# Patient Record
Sex: Female | Born: 1940 | Race: Black or African American | Hispanic: No | Smoking: Former smoker
Health system: Southern US, Community
[De-identification: ages and names within clinical notes are randomized; demographics above are authoritative.]

## PROBLEM LIST (undated history)

## (undated) DIAGNOSIS — H269 Unspecified cataract: Secondary | ICD-10-CM

## (undated) DIAGNOSIS — I10 Essential (primary) hypertension: Secondary | ICD-10-CM

## (undated) DIAGNOSIS — Z860100 Personal history of colon polyps, unspecified: Secondary | ICD-10-CM

## (undated) DIAGNOSIS — J189 Pneumonia, unspecified organism: Secondary | ICD-10-CM

## (undated) DIAGNOSIS — T7840XA Allergy, unspecified, initial encounter: Secondary | ICD-10-CM

## (undated) DIAGNOSIS — K219 Gastro-esophageal reflux disease without esophagitis: Secondary | ICD-10-CM

## (undated) DIAGNOSIS — M810 Age-related osteoporosis without current pathological fracture: Secondary | ICD-10-CM

## (undated) DIAGNOSIS — D649 Anemia, unspecified: Secondary | ICD-10-CM

## (undated) DIAGNOSIS — D490 Neoplasm of unspecified behavior of digestive system: Secondary | ICD-10-CM

## (undated) DIAGNOSIS — R32 Unspecified urinary incontinence: Secondary | ICD-10-CM

## (undated) DIAGNOSIS — E785 Hyperlipidemia, unspecified: Secondary | ICD-10-CM

## (undated) DIAGNOSIS — J449 Chronic obstructive pulmonary disease, unspecified: Secondary | ICD-10-CM

## (undated) DIAGNOSIS — G629 Polyneuropathy, unspecified: Secondary | ICD-10-CM

## (undated) DIAGNOSIS — M21961 Unspecified acquired deformity of right lower leg: Secondary | ICD-10-CM

## (undated) DIAGNOSIS — K2950 Unspecified chronic gastritis without bleeding: Secondary | ICD-10-CM

## (undated) DIAGNOSIS — G5603 Carpal tunnel syndrome, bilateral upper limbs: Secondary | ICD-10-CM

## (undated) DIAGNOSIS — J302 Other seasonal allergic rhinitis: Secondary | ICD-10-CM

## (undated) DIAGNOSIS — R7303 Prediabetes: Secondary | ICD-10-CM

## (undated) DIAGNOSIS — R203 Hyperesthesia: Secondary | ICD-10-CM

## (undated) DIAGNOSIS — M21962 Unspecified acquired deformity of left lower leg: Secondary | ICD-10-CM

## (undated) DIAGNOSIS — M1990 Unspecified osteoarthritis, unspecified site: Secondary | ICD-10-CM

## (undated) DIAGNOSIS — M069 Rheumatoid arthritis, unspecified: Secondary | ICD-10-CM

## (undated) HISTORY — DX: Allergy, unspecified, initial encounter: T78.40XA

## (undated) HISTORY — PX: COLONOSCOPY: SHX174

## (undated) HISTORY — DX: Anemia, unspecified: D64.9

## (undated) HISTORY — DX: Unspecified chronic gastritis without bleeding: K29.50

## (undated) HISTORY — DX: Age-related osteoporosis without current pathological fracture: M81.0

## (undated) HISTORY — PX: VAGINAL HYSTERECTOMY: SUR661

## (undated) HISTORY — DX: Pneumonia, unspecified organism: J18.9

## (undated) HISTORY — DX: Neoplasm of unspecified behavior of digestive system: D49.0

## (undated) HISTORY — DX: Polyneuropathy, unspecified: G62.9

## (undated) HISTORY — PX: CATARACT EXTRACTION: SUR2

## (undated) HISTORY — DX: Prediabetes: R73.03

## (undated) HISTORY — PX: DENTAL SURGERY: SHX609

## (undated) HISTORY — DX: Chronic obstructive pulmonary disease, unspecified: J44.9

---
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
## 2002-11-25 ENCOUNTER — Emergency Department (HOSPITAL_COMMUNITY): Admission: EM | Admit: 2002-11-25 | Discharge: 2002-11-25 | Payer: Self-pay | Admitting: Emergency Medicine

## 2011-10-11 ENCOUNTER — Other Ambulatory Visit: Payer: Self-pay | Admitting: Orthopedic Surgery

## 2011-10-16 ENCOUNTER — Encounter (HOSPITAL_COMMUNITY): Payer: Self-pay | Admitting: Pharmacy Technician

## 2011-10-23 ENCOUNTER — Encounter (HOSPITAL_COMMUNITY): Payer: Self-pay

## 2011-10-23 ENCOUNTER — Encounter (HOSPITAL_COMMUNITY)
Admission: RE | Admit: 2011-10-23 | Discharge: 2011-10-23 | Disposition: A | Discharge status: Home / Self Care | Payer: PRIVATE HEALTH INSURANCE | Source: Ambulatory Visit | Attending: Orthopedic Surgery | Admitting: Orthopedic Surgery

## 2011-10-23 HISTORY — DX: Unspecified cataract: H26.9

## 2011-10-23 HISTORY — DX: Personal history of colonic polyps: Z86.010

## 2011-10-23 HISTORY — DX: Essential (primary) hypertension: I10

## 2011-10-23 HISTORY — DX: Personal history of colon polyps, unspecified: Z86.0100

## 2011-10-23 HISTORY — DX: Hyperesthesia: R20.3

## 2011-10-23 HISTORY — DX: Other seasonal allergic rhinitis: J30.2

## 2011-10-23 HISTORY — DX: Hyperlipidemia, unspecified: E78.5

## 2011-10-23 LAB — URINALYSIS, ROUTINE W REFLEX MICROSCOPIC
Glucose, UA: NEGATIVE mg/dL
Hgb urine dipstick: NEGATIVE
Specific Gravity, Urine: 1.029 (ref 1.005–1.030)
pH: 5.5 (ref 5.0–8.0)

## 2011-10-23 LAB — BASIC METABOLIC PANEL
BUN: 16 mg/dL (ref 6–23)
CO2: 25 mEq/L (ref 19–32)
Chloride: 103 mEq/L (ref 96–112)
Creatinine, Ser: 0.85 mg/dL (ref 0.50–1.10)
Potassium: 3.6 mEq/L (ref 3.5–5.1)

## 2011-10-23 LAB — CBC
HCT: 41.8 % (ref 36.0–46.0)
Hemoglobin: 14.6 g/dL (ref 12.0–15.0)
MCV: 95 fL (ref 78.0–100.0)
RDW: 12.2 % (ref 11.5–15.5)
WBC: 9 10*3/uL (ref 4.0–10.5)

## 2011-10-23 LAB — URINE MICROSCOPIC-ADD ON

## 2011-10-23 LAB — ABO/RH: ABO/RH(D): A POS

## 2011-10-23 LAB — DIFFERENTIAL
Basophils Absolute: 0 10*3/uL (ref 0.0–0.1)
Eosinophils Relative: 3 % (ref 0–5)
Lymphocytes Relative: 36 % (ref 12–46)
Lymphs Abs: 3.3 10*3/uL (ref 0.7–4.0)
Monocytes Absolute: 0.7 10*3/uL (ref 0.1–1.0)
Monocytes Relative: 8 % (ref 3–12)

## 2011-10-23 LAB — TYPE AND SCREEN
ABO/RH(D): A POS
Antibody Screen: NEGATIVE

## 2011-10-23 LAB — APTT: aPTT: 33 seconds (ref 24–37)

## 2011-10-23 MED ORDER — CHLORHEXIDINE GLUCONATE 4 % EX LIQD: 60.0000 mL | Freq: Once | CUTANEOUS | Status: DC

## 2011-10-23 NOTE — Pre-Procedure Instructions (Signed)
[name]  10/23/2011   Your procedure is scheduled on:  Mon, Mar 18 @ 1255pm  Report to [facility] at 1055 AM.  Call this number if you have problems the morning of surgery: [phone]   Remember:   Do not eat food:After Midnight.  May have clear liquids: up to 4 Hours before arrival.(until 6:45 am)  Clear liquids include soda, tea, black coffee, apple or grape juice, broth,water  Take these medicines the morning of surgery with A SIP OF WATER: Amlodipine   Do not wear jewelry, make-up or nail polish.  Do not wear lotions, powders, or perfumes. You may wear deodorant.  Do not shave 48 hours prior to surgery.  Do not bring valuables to the hospital.  Contacts, dentures or bridgework may not be worn into surgery.  Leave suitcase in the car. After surgery it may be brought to your room.  For patients admitted to the hospital, checkout time is 11:00 AM the day of discharge.      Special Instructions: CHG Shower Use Special Wash: 1/2 bottle night before surgery and 1/2 bottle morning of surgery.   Please read over the following fact sheets that you were given: Pain Booklet, Coughing and Deep Breathing, Blood Transfusion Information, Total Joint Packet, MRSA Information and Surgical Site Infection Prevention

## 2011-10-23 NOTE — Progress Notes (Signed)
Pt doesn't have a cardiologist;medical MD Dr.Hodges in [place] maintains HTN/Hyperlipidemia [phone]  Stress test done > 67yrs ago  EKG done 10/19/11-to request from Dr.Hodges  Denies heart cath/echo

## 2011-10-25 LAB — URINE CULTURE

## 2011-10-29 MED ORDER — CEFAZOLIN SODIUM-DEXTROSE 2-3 GM-% IV SOLR
2.0000 g | INTRAVENOUS | Status: AC
  Administered 2011-10-30: 2 g via INTRAVENOUS
  Filled 2011-10-29: qty 50

## 2011-10-29 NOTE — H&P (Signed)
  HISTORY OF PRESENT ILLNESS:  [name] is a 71 year old patient who comes in today complaining of bilateral knee pain that began several years ago with no known mechanism of injury.  She reports that the left knee typically bothers her more than the right but she does have pain in both knees. She localizes her pain to the anterior and medial aspects of the knee and complains of associated popping and instability.  Her family doctor has tried several different arthritis medicines including Celebrex and Mobic. [name] reports minimal pain relief with those and uses BC powders because that seems to work better than anything else.  She has been retired for the last few years but reports that she has trouble doing her chores around the house and her knee pain often wakes her from sleep at night.  At her last visit, she was given a cortisone injection that lasted 1 week.  PAST MEDICAL HISTORY:  Significant for hypertension and high cholesterol.  PAST SURGICAL HISTORY:  Significant for hysterectomy.  ALLERGIES:  She has no know drug allergies.  CURRENT MEDICATIONS:  Hyzaar, Norvasc, simvastatin, K-Dur, baby aspirin and a vitamin.  FAMILY HISTORY:  Negative for diabetes or heart disease.  SOCIAL HISTORY:  She denies the use of alcohol or tobacco.  Review of systems reviewed thoroughly and negative aside from her musculoskeletal complaints.  ROS: Patient denies dizziness, nausea, fever, chills, vomiting, shortness of breath, chest pain, loss of appetite, or rash.    PHYSICAL EXAM: Awake, alert, and oriented x3.  Extraocular motion is intact.  No use of accessory respiratory muscles for breathing.   Cardiovascular exam reveals a regular rhythm.  Skin is intact without cuts, scrapes, or abrasions. Well-developed, well-nourished, 71 year old lady who is alert and oriented, in no acute distress.  Knees demonstrate varus deformity, left greater than right.  She is diffusely tender to palpation along  the medial joint line.  Range of motion is 10 to 95 degrees in the left knee.  She has no obvious effusion.  The ligaments are globally stable.  Skin is intact.  Neurovascularly is within normal limits.  RADIOGRAPHS: X-rays were ordered, performed, and interpreted by me today.  Four views of the knees demonstrate end-stage arthritis in the medial compartment of the left greater than right knee.  She also has some bony erosion on the left side.  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  The chance of Visco supplementation helping her is well under 50%.  It is her desire to proceed with total knee arthroplasty.  Models were brought into the room, the surgery and the length of stay were discussed.

## 2011-10-30 ENCOUNTER — Encounter (HOSPITAL_COMMUNITY): Payer: Self-pay | Admitting: Certified Registered Nurse Anesthetist

## 2011-10-30 ENCOUNTER — Inpatient Hospital Stay (HOSPITAL_COMMUNITY)
Admission: RE | Admit: 2011-10-30 | Discharge: 2011-11-02 | DRG: 470 | Disposition: A | Discharge status: Home Health | Payer: PRIVATE HEALTH INSURANCE | Source: Ambulatory Visit | Attending: Orthopedic Surgery | Admitting: Orthopedic Surgery

## 2011-10-30 ENCOUNTER — Encounter (HOSPITAL_COMMUNITY): Payer: Self-pay | Admitting: *Deleted

## 2011-10-30 ENCOUNTER — Encounter (HOSPITAL_COMMUNITY)
Admission: RE | Disposition: A | Discharge status: Home Health | Payer: Self-pay | Source: Ambulatory Visit | Attending: Orthopedic Surgery

## 2011-10-30 ENCOUNTER — Ambulatory Visit (HOSPITAL_COMMUNITY): Payer: PRIVATE HEALTH INSURANCE | Admitting: Certified Registered Nurse Anesthetist

## 2011-10-30 DIAGNOSIS — Z7901 Long term (current) use of anticoagulants: Secondary | ICD-10-CM

## 2011-10-30 DIAGNOSIS — M1712 Unilateral primary osteoarthritis, left knee: Secondary | ICD-10-CM

## 2011-10-30 DIAGNOSIS — I10 Essential (primary) hypertension: Secondary | ICD-10-CM | POA: Diagnosis present

## 2011-10-30 DIAGNOSIS — E785 Hyperlipidemia, unspecified: Secondary | ICD-10-CM | POA: Diagnosis present

## 2011-10-30 DIAGNOSIS — M1710 Unilateral primary osteoarthritis, unspecified knee: Principal | ICD-10-CM | POA: Diagnosis present

## 2011-10-30 DIAGNOSIS — Z79899 Other long term (current) drug therapy: Secondary | ICD-10-CM

## 2011-10-30 DIAGNOSIS — Z860100 Personal history of colon polyps, unspecified: Secondary | ICD-10-CM

## 2011-10-30 DIAGNOSIS — E7800 Pure hypercholesterolemia, unspecified: Secondary | ICD-10-CM | POA: Diagnosis present

## 2011-10-30 DIAGNOSIS — Z01812 Encounter for preprocedural laboratory examination: Secondary | ICD-10-CM

## 2011-10-30 HISTORY — PX: TOTAL KNEE ARTHROPLASTY: SHX125

## 2011-10-30 SURGERY — ARTHROPLASTY, KNEE, TOTAL
Anesthesia: General | Site: Knee | Laterality: Left | Wound class: Clean

## 2011-10-30 MED ORDER — LACTATED RINGERS IV SOLN
INTRAVENOUS | Status: DC | PRN
  Administered 2011-10-30 (×2): via INTRAVENOUS

## 2011-10-30 MED ORDER — METHOCARBAMOL 100 MG/ML IJ SOLN
500.0000 mg | INTRAVENOUS | Status: AC
  Filled 2011-10-30: qty 5

## 2011-10-30 MED ORDER — ONDANSETRON HCL 4 MG/2ML IJ SOLN
4.0000 mg | Freq: Four times a day (QID) | INTRAMUSCULAR | Status: DC | PRN
  Filled 2011-10-30: qty 2

## 2011-10-30 MED ORDER — COUMADIN BOOK
Freq: Once | Status: AC
  Administered 2011-10-30: 15:00:00
  Filled 2011-10-30: qty 1

## 2011-10-30 MED ORDER — MENTHOL 3 MG MT LOZG: 1.0000 | LOZENGE | OROMUCOSAL | Status: DC | PRN

## 2011-10-30 MED ORDER — BUPIVACAINE-EPINEPHRINE PF 0.5-1:200000 % IJ SOLN
INTRAMUSCULAR | Status: DC | PRN
  Administered 2011-10-30: 30 mL

## 2011-10-30 MED ORDER — WARFARIN SODIUM 5 MG PO TABS
5.0000 mg | ORAL_TABLET | Freq: Every day | ORAL | Status: AC
  Administered 2011-10-30: 5 mg via ORAL
  Filled 2011-10-30: qty 1

## 2011-10-30 MED ORDER — WARFARIN - PHARMACIST DOSING INPATIENT
Freq: Every day | Status: DC
  Administered 2011-10-30: 18:00:00

## 2011-10-30 MED ORDER — DEXTROSE-NACL 5-0.45 % IV SOLN: INTRAVENOUS | Status: DC

## 2011-10-30 MED ORDER — WARFARIN VIDEO: Freq: Once | Status: DC

## 2011-10-30 MED ORDER — KCL IN DEXTROSE-NACL 20-5-0.45 MEQ/L-%-% IV SOLN
INTRAVENOUS | Status: DC
  Administered 2011-10-30 – 2011-11-01 (×4): via INTRAVENOUS
  Filled 2011-10-30 (×12): qty 1000

## 2011-10-30 MED ORDER — FENTANYL CITRATE 0.05 MG/ML IJ SOLN
50.0000 ug | INTRAMUSCULAR | Status: DC | PRN
  Administered 2011-10-30: 100 ug via INTRAVENOUS

## 2011-10-30 MED ORDER — MIDAZOLAM HCL 2 MG/2ML IJ SOLN
INTRAMUSCULAR | Status: AC
  Filled 2011-10-30: qty 2

## 2011-10-30 MED ORDER — ACETAMINOPHEN 10 MG/ML IV SOLN
1000.0000 mg | Freq: Once | INTRAVENOUS | Status: AC
  Administered 2011-10-30: 1000 mg via INTRAVENOUS
  Filled 2011-10-30: qty 100

## 2011-10-30 MED ORDER — PROPOFOL 10 MG/ML IV BOLUS
INTRAVENOUS | Status: DC | PRN
  Administered 2011-10-30: 120 mg via INTRAVENOUS

## 2011-10-30 MED ORDER — METHOCARBAMOL 500 MG PO TABS
500.0000 mg | ORAL_TABLET | Freq: Four times a day (QID) | ORAL | Status: DC | PRN
  Administered 2011-10-31: 500 mg via ORAL
  Filled 2011-10-30: qty 1

## 2011-10-30 MED ORDER — FENTANYL CITRATE 0.05 MG/ML IJ SOLN
INTRAMUSCULAR | Status: DC | PRN
  Administered 2011-10-30: 50 ug via INTRAVENOUS
  Administered 2011-10-30: 100 ug via INTRAVENOUS
  Administered 2011-10-30 (×2): 50 ug via INTRAVENOUS

## 2011-10-30 MED ORDER — HYDROMORPHONE HCL PF 1 MG/ML IJ SOLN
0.2500 mg | INTRAMUSCULAR | Status: DC | PRN
  Administered 2011-10-30 (×2): 0.25 mg via INTRAVENOUS

## 2011-10-30 MED ORDER — LACTATED RINGERS IV SOLN
INTRAVENOUS | Status: DC
  Administered 2011-10-30 (×2): via INTRAVENOUS

## 2011-10-30 MED ORDER — OXYCODONE-ACETAMINOPHEN 5-325 MG PO TABS: 1.0000 | ORAL_TABLET | ORAL | Status: DC | PRN

## 2011-10-30 MED ORDER — ENOXAPARIN SODIUM 30 MG/0.3ML ~~LOC~~ SOLN
30.0000 mg | Freq: Two times a day (BID) | SUBCUTANEOUS | Status: DC
  Administered 2011-10-30 – 2011-11-01 (×4): 30 mg via SUBCUTANEOUS
  Filled 2011-10-30 (×7): qty 0.3

## 2011-10-30 MED ORDER — PHENOL 1.4 % MT LIQD: 1.0000 | OROMUCOSAL | Status: DC | PRN

## 2011-10-30 MED ORDER — BISACODYL 10 MG RE SUPP: 10.0000 mg | Freq: Every day | RECTAL | Status: DC | PRN

## 2011-10-30 MED ORDER — ONDANSETRON HCL 4 MG/2ML IJ SOLN
INTRAMUSCULAR | Status: DC | PRN
  Administered 2011-10-30: 4 mg via INTRAVENOUS

## 2011-10-30 MED ORDER — MIDAZOLAM HCL 2 MG/2ML IJ SOLN
1.0000 mg | INTRAMUSCULAR | Status: DC | PRN
  Administered 2011-10-30: 1 mg via INTRAVENOUS

## 2011-10-30 MED ORDER — SIMVASTATIN 10 MG PO TABS
10.0000 mg | ORAL_TABLET | Freq: Every day | ORAL | Status: DC
  Administered 2011-10-30 – 2011-11-01 (×3): 10 mg via ORAL
  Filled 2011-10-30 (×6): qty 1

## 2011-10-30 MED ORDER — LOSARTAN POTASSIUM 50 MG PO TABS
100.0000 mg | ORAL_TABLET | Freq: Every day | ORAL | Status: DC
  Administered 2011-10-31 – 2011-11-02 (×3): 100 mg via ORAL
  Filled 2011-10-30 (×4): qty 2

## 2011-10-30 MED ORDER — ACETAMINOPHEN 10 MG/ML IV SOLN
INTRAVENOUS | Status: AC
  Filled 2011-10-30: qty 100

## 2011-10-30 MED ORDER — METHOCARBAMOL 100 MG/ML IJ SOLN
500.0000 mg | Freq: Four times a day (QID) | INTRAVENOUS | Status: DC | PRN
  Filled 2011-10-30: qty 5

## 2011-10-30 MED ORDER — MIDAZOLAM HCL 2 MG/2ML IJ SOLN: 2.0000 mg | INTRAMUSCULAR | Status: DC | PRN

## 2011-10-30 MED ORDER — HYDROCHLOROTHIAZIDE 25 MG PO TABS
25.0000 mg | ORAL_TABLET | Freq: Every day | ORAL | Status: DC
  Administered 2011-11-01: 25 mg via ORAL
  Filled 2011-10-30 (×4): qty 1

## 2011-10-30 MED ORDER — ZOLPIDEM TARTRATE 5 MG PO TABS: 5.0000 mg | ORAL_TABLET | Freq: Every evening | ORAL | Status: DC | PRN

## 2011-10-30 MED ORDER — DOCUSATE SODIUM 100 MG PO CAPS
100.0000 mg | ORAL_CAPSULE | Freq: Two times a day (BID) | ORAL | Status: DC
  Administered 2011-10-30 – 2011-11-02 (×7): 100 mg via ORAL
  Filled 2011-10-30 (×8): qty 1

## 2011-10-30 MED ORDER — METOCLOPRAMIDE HCL 5 MG PO TABS
5.0000 mg | ORAL_TABLET | Freq: Three times a day (TID) | ORAL | Status: DC | PRN
  Filled 2011-10-30: qty 2

## 2011-10-30 MED ORDER — FLEET ENEMA 7-19 GM/118ML RE ENEM: 1.0000 | ENEMA | Freq: Once | RECTAL | Status: AC | PRN

## 2011-10-30 MED ORDER — HYDROCODONE-ACETAMINOPHEN 5-325 MG PO TABS
1.0000 | ORAL_TABLET | ORAL | Status: DC | PRN
  Administered 2011-10-30 – 2011-11-01 (×5): 2 via ORAL
  Filled 2011-10-30 (×6): qty 2

## 2011-10-30 MED ORDER — FENTANYL CITRATE 0.05 MG/ML IJ SOLN: 100.0000 ug | INTRAMUSCULAR | Status: DC | PRN

## 2011-10-30 MED ORDER — ACETAMINOPHEN 325 MG PO TABS
650.0000 mg | ORAL_TABLET | Freq: Four times a day (QID) | ORAL | Status: DC | PRN
  Administered 2011-10-31 – 2011-11-02 (×2): 650 mg via ORAL
  Filled 2011-10-30 (×2): qty 2

## 2011-10-30 MED ORDER — HYDROMORPHONE HCL PF 1 MG/ML IJ SOLN: 0.5000 mg | INTRAMUSCULAR | Status: DC | PRN

## 2011-10-30 MED ORDER — ACETAMINOPHEN 650 MG RE SUPP: 650.0000 mg | Freq: Four times a day (QID) | RECTAL | Status: DC | PRN

## 2011-10-30 MED ORDER — CEFUROXIME SODIUM 1.5 G IJ SOLR
INTRAMUSCULAR | Status: DC | PRN
  Administered 2011-10-30: 1.5 g

## 2011-10-30 MED ORDER — AMLODIPINE BESYLATE 5 MG PO TABS
5.0000 mg | ORAL_TABLET | Freq: Every day | ORAL | Status: DC
  Administered 2011-10-30 – 2011-11-01 (×3): 5 mg via ORAL
  Filled 2011-10-30 (×5): qty 1

## 2011-10-30 MED ORDER — FENTANYL CITRATE 0.05 MG/ML IJ SOLN
INTRAMUSCULAR | Status: AC
  Filled 2011-10-30: qty 2

## 2011-10-30 MED ORDER — ALUM & MAG HYDROXIDE-SIMETH 200-200-20 MG/5ML PO SUSP: 30.0000 mL | ORAL | Status: DC | PRN

## 2011-10-30 MED ORDER — DIPHENHYDRAMINE HCL 12.5 MG/5ML PO ELIX: 12.5000 mg | ORAL_SOLUTION | ORAL | Status: DC | PRN

## 2011-10-30 MED ORDER — ONDANSETRON HCL 4 MG PO TABS
4.0000 mg | ORAL_TABLET | Freq: Four times a day (QID) | ORAL | Status: DC | PRN
  Filled 2011-10-30: qty 1

## 2011-10-30 MED ORDER — LOSARTAN POTASSIUM-HCTZ 100-25 MG PO TABS: 1.0000 | ORAL_TABLET | Freq: Every day | ORAL | Status: DC

## 2011-10-30 MED ORDER — SODIUM CHLORIDE 0.9 % IR SOLN
Status: DC | PRN
  Administered 2011-10-30: 3000 mL

## 2011-10-30 MED ORDER — MAGNESIUM HYDROXIDE 400 MG/5ML PO SUSP: 30.0000 mL | Freq: Every day | ORAL | Status: DC | PRN

## 2011-10-30 MED ORDER — METOCLOPRAMIDE HCL 5 MG/ML IJ SOLN: 5.0000 mg | Freq: Three times a day (TID) | INTRAMUSCULAR | Status: DC | PRN

## 2011-10-30 SURGICAL SUPPLY — 60 items
BANDAGE ELASTIC 6 VELCRO ST LF (GAUZE/BANDAGES/DRESSINGS) ×2 IMPLANT
BANDAGE ESMARK 6X9 LF (GAUZE/BANDAGES/DRESSINGS) ×1 IMPLANT
BLADE SAG 18X100X1.27 (BLADE) ×2 IMPLANT
BLADE SAGITTAL 25.0X1.19X90 (BLADE) ×2 IMPLANT
BLADE SAW SAG 29X58X.64 (BLADE) IMPLANT
BLADE SAW SGTL 13.0X1.19X90.0M (BLADE) ×2 IMPLANT
BLADE SAW SGTL 13X75X1.27 (BLADE) ×2 IMPLANT
BLADE SURG ROTATE 9660 (MISCELLANEOUS) IMPLANT
BNDG ELASTIC 6X10 VLCR STRL LF (GAUZE/BANDAGES/DRESSINGS) ×2 IMPLANT
BNDG ESMARK 6X9 LF (GAUZE/BANDAGES/DRESSINGS) ×2
BOWL SMART MIX CTS (DISPOSABLE) ×2 IMPLANT
CEMENT HV SMART SET (Cement) ×4 IMPLANT
CLOTH BEACON ORANGE TIMEOUT ST (SAFETY) ×2 IMPLANT
COVER BACK TABLE 24X17X13 BIG (DRAPES) IMPLANT
COVER SURGICAL LIGHT HANDLE (MISCELLANEOUS) ×4 IMPLANT
CUFF TOURNIQUET SINGLE 34IN LL (TOURNIQUET CUFF) ×2 IMPLANT
CUFF TOURNIQUET SINGLE 44IN (TOURNIQUET CUFF) IMPLANT
DRAPE EXTREMITY T 121X128X90 (DRAPE) ×2 IMPLANT
DRAPE U-SHAPE 47X51 STRL (DRAPES) ×2 IMPLANT
DURAPREP 26ML APPLICATOR (WOUND CARE) ×2 IMPLANT
ELECT REM PT RETURN 9FT ADLT (ELECTROSURGICAL) ×2
ELECTRODE REM PT RTRN 9FT ADLT (ELECTROSURGICAL) ×1 IMPLANT
EVACUATOR 1/8 PVC DRAIN (DRAIN) ×2 IMPLANT
GAUZE XEROFORM 1X8 LF (GAUZE/BANDAGES/DRESSINGS) ×2 IMPLANT
GLOVE BIO SURGEON STRL SZ7 (GLOVE) ×2 IMPLANT
GLOVE BIO SURGEON STRL SZ7.5 (GLOVE) ×2 IMPLANT
GLOVE BIOGEL PI IND STRL 6.5 (GLOVE) ×3 IMPLANT
GLOVE BIOGEL PI IND STRL 7.0 (GLOVE) ×1 IMPLANT
GLOVE BIOGEL PI IND STRL 8 (GLOVE) ×1 IMPLANT
GLOVE BIOGEL PI INDICATOR 6.5 (GLOVE) ×3
GLOVE BIOGEL PI INDICATOR 7.0 (GLOVE) ×1
GLOVE BIOGEL PI INDICATOR 8 (GLOVE) ×1
GOWN PREVENTION PLUS XLARGE (GOWN DISPOSABLE) ×2 IMPLANT
GOWN STRL NON-REIN LRG LVL3 (GOWN DISPOSABLE) ×4 IMPLANT
HANDPIECE INTERPULSE COAX TIP (DISPOSABLE) ×1
HOOD PEEL AWAY FACE SHEILD DIS (HOOD) ×4 IMPLANT
KIT BASIN OR (CUSTOM PROCEDURE TRAY) ×2 IMPLANT
KIT ROOM TURNOVER OR (KITS) ×2 IMPLANT
MANIFOLD NEPTUNE II (INSTRUMENTS) ×2 IMPLANT
NS IRRIG 1000ML POUR BTL (IV SOLUTION) ×2 IMPLANT
PACK TOTAL JOINT (CUSTOM PROCEDURE TRAY) ×2 IMPLANT
PAD ARMBOARD 7.5X6 YLW CONV (MISCELLANEOUS) ×4 IMPLANT
PAD CAST 4YDX4 CTTN HI CHSV (CAST SUPPLIES) ×1 IMPLANT
PADDING CAST COTTON 4X4 STRL (CAST SUPPLIES) ×1
PADDING CAST COTTON 6X4 STRL (CAST SUPPLIES) ×2 IMPLANT
SET HNDPC FAN SPRY TIP SCT (DISPOSABLE) ×1 IMPLANT
SPONGE GAUZE 4X4 12PLY (GAUZE/BANDAGES/DRESSINGS) ×4 IMPLANT
STAPLER VISISTAT 35W (STAPLE) ×2 IMPLANT
SUCTION FRAZIER TIP 10 FR DISP (SUCTIONS) ×2 IMPLANT
SURGIFLO TRUKIT (HEMOSTASIS) IMPLANT
SUT VIC AB 0 CTX 36 (SUTURE) ×1
SUT VIC AB 0 CTX36XBRD ANTBCTR (SUTURE) ×1 IMPLANT
SUT VIC AB 1 CTX 36 (SUTURE) ×1
SUT VIC AB 1 CTX36XBRD ANBCTR (SUTURE) ×1 IMPLANT
SUT VIC AB 2-0 CT1 27 (SUTURE) ×1
SUT VIC AB 2-0 CT1 TAPERPNT 27 (SUTURE) ×1 IMPLANT
TOWEL OR 17X24 6PK STRL BLUE (TOWEL DISPOSABLE) ×2 IMPLANT
TOWEL OR 17X26 10 PK STRL BLUE (TOWEL DISPOSABLE) ×2 IMPLANT
TRAY FOLEY CATH 14FR (SET/KITS/TRAYS/PACK) ×2 IMPLANT
WATER STERILE IRR 1000ML POUR (IV SOLUTION) ×6 IMPLANT

## 2011-10-30 NOTE — Transfer of Care (Signed)
Immediate Anesthesia Transfer of Care Note  Patient: Ann Gamble  Procedure(s) Performed: Procedure(s) (LRB): TOTAL KNEE ARTHROPLASTY (Left)  Patient Location: PACU  Anesthesia Type: GA combined with regional for post-op pain  Level of Consciousness: awake, alert  and oriented  Airway & Oxygen Therapy: Patient Spontanous Breathing and Patient connected to nasal cannula oxygen  Post-op Assessment: Report given to PACU RN and Post -op Vital signs reviewed and stable  Post vital signs: Reviewed and stable  Complications: No apparent anesthesia complications

## 2011-10-30 NOTE — Progress Notes (Signed)
Orthopedic Tech Progress Note Patient Details:  Ann Gamble [birth year] [id number]  CPM Left Knee CPM Left Knee: On Left Knee Flexion (Degrees): 30  Left Knee Extension (Degrees): 0    Sheliah Fiorillo T 10/30/2011, 1:43 PM

## 2011-10-30 NOTE — Anesthesia Preprocedure Evaluation (Signed)
Anesthesia Evaluation  Patient identified by MRN, date of birth, ID band Patient awake    Reviewed: Allergy & Precautions, H&P , NPO status , Patient's Chart, lab work & pertinent test results  Airway Mallampati: II TM Distance: >3 FB Neck ROM: Full    Dental No notable dental hx. (+) Edentulous Upper and Edentulous Lower   Pulmonary neg pulmonary ROS,  breath sounds clear to auscultation  Pulmonary exam normal       Cardiovascular hypertension, On Medications Rhythm:Regular Rate:Normal     Neuro/Psych negative neurological ROS  negative psych ROS   GI/Hepatic negative GI ROS, Neg liver ROS,   Endo/Other  negative endocrine ROS  Renal/GU negative Renal ROS  negative genitourinary   Musculoskeletal   Abdominal   Peds  Hematology negative hematology ROS (+)   Anesthesia Other Findings   Reproductive/Obstetrics negative OB ROS                           Anesthesia Physical Anesthesia Plan  ASA: II  Anesthesia Plan: General   Post-op Pain Management:    Induction: Intravenous  Airway Management Planned: Oral ETT  Additional Equipment:   Intra-op Plan:   Post-operative Plan: Extubation in OR  Informed Consent: I have reviewed the patients History and Physical, chart, labs and discussed the procedure including the risks, benefits and alternatives for the proposed anesthesia with the patient or authorized representative who has indicated his/her understanding and acceptance.     Plan Discussed with: CRNA  Anesthesia Plan Comments:         Anesthesia Quick Evaluation

## 2011-10-30 NOTE — Anesthesia Procedure Notes (Addendum)
Anesthesia Regional Block:  Femoral nerve block  Pre-Anesthetic Checklist: ,, timeout performed, Correct Patient, Correct Site, Correct Laterality, Correct Procedure, Correct Position, site marked, Risks and benefits discussed, pre-op evaluation,  At surgeon's request and post-op pain management  Laterality: Left  Prep: Maximum Sterile Barrier Precautions used and chloraprep       Needles:  Injection technique: Single-shot  Needle Type: Echogenic Stimulator Needle      Needle Gauge: 22 and 22 G    Additional Needles:  Procedures: nerve stimulator Femoral nerve block  Nerve Stimulator or Paresthesia:  Response: Patellar respose, 0.4 mA,   Additional Responses:   Narrative:  Start time: 10/30/2011 10:30 AM End time: 10/30/2011 10:40 AM Injection made incrementally with aspirations every 5 mL. Anesthesiologist: Fitzgerald,MD  Additional Notes: 2% Lidocaine skin wheel.   Femoral nerve block Procedure Name: LMA Insertion Date/Time: 10/30/2011 11:11 AM Performed by: Delbert Harness Pre-anesthesia Checklist: Patient identified, Timeout performed, Emergency Drugs available, Suction available and Patient being monitored Patient Re-evaluated:Patient Re-evaluated prior to inductionOxygen Delivery Method: Circle system utilized Preoxygenation: Pre-oxygenation with 100% oxygen Intubation Type: IV induction LMA: LMA inserted LMA Size: 4.0 Number of attempts: 1 Tube secured with: Tape Dental Injury: Teeth and Oropharynx as per pre-operative assessment

## 2011-10-30 NOTE — Interval H&P Note (Signed)
History and Physical Interval Note:  10/30/2011 10:57 AM  Ann Gamble  has presented today for surgery, with the diagnosis of Osetoarthritis Left Knee  The various methods of treatment have been discussed with the patient and family. After consideration of risks, benefits and other options for treatment, the patient has consented to  Procedure(s) (LRB): TOTAL KNEE ARTHROPLASTY (Left) as a surgical intervention .  The patients' history has been reviewed, patient examined, no change in status, stable for surgery.  I have reviewed the patients' chart and labs.  Questions were answered to the patient's satisfaction.     Nestor Lewandowsky

## 2011-10-30 NOTE — Anesthesia Postprocedure Evaluation (Signed)
Anesthesia Post Note  Patient: Ann Gamble  Procedure(s) Performed: Procedure(s) (LRB): TOTAL KNEE ARTHROPLASTY (Left)  Anesthesia type: general  Patient location: PACU  Post pain: Pain level controlled  Post assessment: Patient's Cardiovascular Status Stable  Last Vitals:  Filed Vitals:   10/30/11 1420  BP:   Pulse:   Temp: 36.6 C  Resp:     Post vital signs: Reviewed and stable  Level of consciousness: sedated  Complications: No apparent anesthesia complications

## 2011-10-30 NOTE — Op Note (Signed)
PATIENT ID:      Ann Gamble  MRN:     [id number] DOB/AGE:    1941-05-13 / 71 y.o.       OPERATIVE REPORT    DATE OF PROCEDURE:  10/30/2011       PREOPERATIVE DIAGNOSIS:   Osetoarthritis Left Knee      There is no height or weight on file to calculate BMI.                                                        POSTOPERATIVE DIAGNOSIS:   Osetoarthritis Left Knee                                                                      PROCEDURE:  Procedure(s): TOTAL KNEE ARTHROPLASTY Using Depuy Sigma RP implants #4N L Femur, #3Tibia, 10mm sigma RP bearing, 35 Patella     SURGEON: Harlowe Dowler J    ASSISTANT:   Shirl Harris PA-C   (Present and scrubbed throughout the case, critical for assistance with exposure, retraction, instrumentation, and closure.)         ANESTHESIA: Fem block/GET   DRAINS: foley, 2 medium hemovac in knee   TOURNIQUET TIME:    COMPLICATIONS:  None     SPECIMENS: None   INDICATIONS FOR PROCEDURE: The patient has  Osetoarthritis Left Knee, varus deformities, XR shows bone on bone arthritis. Patient has failed all conservative measures including anti-inflammatory medicines, narcotics, attempts at  exercise and weight loss, cortisone injections and viscosupplementation.  Risks and benefits of surgery have been discussed, questions answered.   DESCRIPTION OF PROCEDURE: The patient identified by armband, received  right femoral nerve block and IV antibiotics, in the holding area at [facility]. Patient taken to the operating room, appropriate anesthetic  monitors were attached General endotracheal anesthesia induced with  the patient in supine position, Foley catheter was inserted. Tourniquet  applied high to the operative thigh. Lateral post and foot positioner  applied to the table, the lower extremity was then prepped and draped  in usual sterile fashion from the ankle to the tourniquet. Time-out procedure was performed. The limb was wrapped  with an Esmarch bandage and the tourniquet inflated to 350 mmHg. We began the operation by making the anterior midline incision starting at  handbreadth above the patella going over the patella 1 cm medial to and  4 cm distal to the tibial tubercle. Small bleeders in the skin and the  subcutaneous tissue identified and cauterized. Transverse retinaculum was incised and reflected medially and a medial parapatellar arthrotomy was accomplished. the patella was everted and theprepatellar fat pad resected. The superficial medial collateral  ligament was then elevated from anterior to posterior along the proximal  flare of the tibia and anterior half of the menisci resected. The knee was hyperflexed exposing bone on bone arthritis. Peripheral and notch osteophytes as well as the cruciate ligaments were then resected. We continued to  work our way around posteriorly along the proximal tibia, and externally  rotated the tibia subluxing it out from underneath the  femur. A McHale  retractor was placed through the notch and a lateral Hohmann retractor  placed, and we then drilled through the proximal tibia in line with the  axis of the tibia followed by an intramedullary guide rod and 2-degree  posterior slope cutting guide. The tibial cutting guide was pinned into place  allowing resection of 3 mm of bone medially and about 10 mm of bone  laterally because of her varus deformity. Satisfied with the tibial resection, we then  entered the distal femur 2 mm anterior to the PCL origin with the  intramedullary guide rod and applied the distal femoral cutting guide  set at 11mm, with 5 degrees of valgus. This was pinned along the  epicondylar axis. At this point, the distal femoral cut was accomplished without difficulty. We then sized for a #4N L femoral component and pinned the guide in 3 degrees of external rotation.The chamfer cutting guide was pinned into place. The anterior, posterior, and chamfer cuts were  accomplished without difficulty followed by  the Sigma RP box cutting guide and the box cut. We also removed posterior osteophytes from the posterior femoral condyles. At this  time, the knee was brought into full extension. We checked our  extension and flexion gaps and found them symmetric at 10mm.  The patella thickness measured at 24mm mm. We felt a 35 patella would  fit. We set the cutting guide at 15 and removed the posterior 9.5-10 mm  of the patella sized for 35 button and drilled the lollipop. The knee  was then once again hyperflexed exposing the proximal tibia. We sized for a #3 tibial base plate, applied the smokestack and the conical reamer followed by the the Delta fin keel punch. We then hammered into place the Sigma RP trial femoral component, inserted a 10-mm trial bearing, trial patellar button, and took the knee through range of motion from 0-130 degrees. No thumb pressure was required for patellar  tracking. At this point, all trial components were removed, a double batch of DePuy HV cement with 1500 mg of Zinacef was mixed and applied to all bony metallic mating surfaces except for the posterior condyles of the femur itself. In order, we  hammered into place the tibial tray and removed excess cement, the femoral component and removed excess cement, a 10-mm Sigma RP bearing  was inserted, and the knee brought to full extension with compression.  The patellar button was clamped into place, and excess cement  removed. While the cement cured the wound was irrigated out with normal saline solution pulse lavage, and medium Hemovac drains were placed from an anterolateral  approach. Ligament stability and patellar tracking were checked and found to be excellent. The parapatellar arthrotomy was closed with  running #1 Vicryl suture. The subcutaneous tissue with 0 and 2-0 undyed  Vicryl suture, and the skin with skin staples. A dressing of Xeroform,  4 x 4, dressing sponges, Webril, and  Ace wrap applied. The patient  awakened, extubated, and taken to recovery room without difficulty.   Nestor Lewandowsky 10/30/2011, 12:32 PM

## 2011-10-30 NOTE — Preoperative (Signed)
Beta Blockers   Reason not to administer Beta Blockers:Not Applicable 

## 2011-10-30 NOTE — Progress Notes (Signed)
ANTICOAGULATION CONSULT NOTE - Initial Consult  Pharmacy Consult for Coumadin Indication: VTE prophylaxis  No Known Allergies  Patient Measurements: 76 kg  Vital Signs: Temp: 97.2 F (36.2 C) (03/18 1453) Temp src: Oral (03/18 0900) BP: 112/67 mmHg (03/18 1453) Pulse Rate: 91  (03/18 1453)  Labs: No results found for this basename: HGB:2,HCT:3,PLT:3,APTT:3,LABPROT:3,INR:3,HEPARINUNFRC:3,CREATININE:3,CKTOTAL:3,CKMB:3,TROPONINI:3 in the last 72 hours CrCl is unknown because there is no height on file for the current visit.  Medical History: Past Medical History  Diagnosis Date  . Hypertension     takes Hyzaar and Amlodipine daily  . Hyperlipidemia     takes Simvastatin daily  . Seasonal allergies   . Arthritis   . Sensitive skin     pt states she has highly sensitive skin  . Constipation     takes stool softener nightly  . Hx of colonic polyps   . Urinary leakage   . Cataract immature     right    Medications:  Scheduled:    . acetaminophen  1,000 mg Intravenous Once  . amLODipine  5 mg Oral QHS  .  ceFAZolin (ANCEF) IV  2 g Intravenous 60 min Pre-Op  . docusate sodium  100 mg Oral BID  . enoxaparin  30 mg Subcutaneous Q12H  . hydrochlorothiazide  25 mg Oral Daily  . losartan  100 mg Oral Daily  . methocarbamol(ROBAXIN) IV  500 mg Intravenous To PACU  . simvastatin  10 mg Oral QHS  . DISCONTD: losartan-hydrochlorothiazide  1 tablet Oral Daily    Assessment: 71 year old s/p TKA, Beginning Coumadin for VTE prophylaxis  Goal of Therapy:  INR 2-3   Plan:  1) Coumadin 5 mg po daily at 1800 pm 2) Daily PT / INR 3) Begin Coumadin education  Thank you.  Elwin Sleight 10/30/2011,3:11 PM

## 2011-10-30 NOTE — Progress Notes (Signed)
Notified Dr. Sampson Goon of pt. has a ring on left fifth finger that will not come off. No new orders.

## 2011-10-31 ENCOUNTER — Encounter (HOSPITAL_COMMUNITY): Payer: Self-pay | Admitting: Orthopedic Surgery

## 2011-10-31 LAB — CBC
Platelets: 209 10*3/uL (ref 150–400)
RDW: 12.3 % (ref 11.5–15.5)
WBC: 9.9 10*3/uL (ref 4.0–10.5)

## 2011-10-31 LAB — BASIC METABOLIC PANEL
Chloride: 99 mEq/L (ref 96–112)
GFR calc Af Amer: >90 mL/min (ref >90)
Potassium: 3 mEq/L — ABNORMAL LOW (ref 3.5–5.1)

## 2011-10-31 LAB — PROTIME-INR: Prothrombin Time: 15.7 seconds — ABNORMAL HIGH (ref 11.6–15.2)

## 2011-10-31 MED ORDER — WARFARIN SODIUM 3 MG PO TABS
3.0000 mg | ORAL_TABLET | Freq: Once | ORAL | Status: AC
  Administered 2011-10-31: 3 mg via ORAL
  Filled 2011-10-31: qty 1

## 2011-10-31 NOTE — Progress Notes (Signed)
Physical Therapy Note   10/31/11 1100  PT Visit Information  Last PT Received On 10/31/11  Precautions  Precautions Knee  Restrictions  Weight Bearing Restrictions Yes  LLE Weight Bearing WBAT  Bed Mobility  Bed Mobility No  Transfers  Transfers Yes  Sit to Stand 5: Supervision;With upper extremity assist;From chair/3-in-1  Sit to Stand Details (indicate cue type and reason) demos good technique  Stand to Sit 5: Supervision;With upper extremity assist;To chair/3-in-1  Stand to Sit Details demos good technique.  Only cues to center pt at chair prior to sitting.    Ambulation/Gait  Ambulation/Gait Yes  Ambulation/Gait Assistance 4: Min assist  Ambulation/Gait Assistance Details (indicate cue type and reason) cues for upright posture, decrease WB on RW  Ambulation Distance (Feet) 100 Feet  Assistive device Rolling walker  Gait Pattern Step-to pattern;Decreased step length - right;Decreased stance time - left;Trunk flexed  Stairs No  Wheelchair Mobility  Wheelchair Mobility No  Posture/Postural Control  Posture/Postural Control No significant limitations  Balance  Balance Assessed No  Total Joint Exercises  Long Arc Quad AROM;Left;10 reps  Knee Flexion AROM;Left;10 reps  PT - End of Session  Equipment Utilized During Treatment Gait belt  Activity Tolerance Patient tolerated treatment well  Patient left in chair;with call bell in reach  Nurse Communication Mobility status for transfers;Mobility status for ambulation  General  Behavior During Session [facility] for tasks performed  [facility] for tasks performed  PT - Assessment/Plan  Comments on Treatment Session pt s/p TKA.  pt making great progress!  pt notes doing APs and QSs independently.    PT Plan Discharge plan remains appropriate;Frequency remains appropriate  PT Frequency 7X/week  Follow Up Recommendations Home health PT;Supervision - Intermittent  Equipment Recommended None recommended by PT  Acute Rehab PT Goals    PT Goal: Sit to Stand - Progress Progressing toward goal  PT Goal: Stand to Sit - Progress Progressing toward goal  PT Goal: Ambulate - Progress Progressing toward goal  PT Goal: Perform Home Exercise Program - Progress Progressing toward goal

## 2011-10-31 NOTE — Progress Notes (Signed)
ANTICOAGULATION CONSULT NOTE - Follow Up Consult  Pharmacy Consult for Coumadin Indication: VTE prophylaxis  No Known Allergies  Patient Measurements: 76 kg  Vital Signs: Temp: 99 F (37.2 C) (03/19 0900) Temp src: Oral (03/19 0900) BP: 128/60 mmHg (03/19 0900) Pulse Rate: 88  (03/19 0900)  Labs:  Basename 10/31/11 0525  HGB 10.3*  HCT 30.1*  PLT 209  APTT --  LABPROT 15.7*  INR 1.22  HEPARINUNFRC --  CREATININE 0.76  CKTOTAL --  CKMB --  TROPONINI --   Estimated Creatinine Clearance: 60.5 ml/min (by C-G formula based on Cr of 0.76).  Medical History: Past Medical History  Diagnosis Date  . Hypertension     takes Hyzaar and Amlodipine daily  . Hyperlipidemia     takes Simvastatin daily  . Seasonal allergies   . Arthritis   . Sensitive skin     pt states she has highly sensitive skin  . Constipation     takes stool softener nightly  . Hx of colonic polyps   . Urinary leakage   . Cataract immature     right    Medications:  Scheduled:     . acetaminophen  1,000 mg Intravenous Once  . amLODipine  5 mg Oral QHS  .  ceFAZolin (ANCEF) IV  2 g Intravenous 60 min Pre-Op  . coumadin book   Does not apply Once  . docusate sodium  100 mg Oral BID  . enoxaparin  30 mg Subcutaneous Q12H  . hydrochlorothiazide  25 mg Oral Daily  . losartan  100 mg Oral Daily  . methocarbamol(ROBAXIN) IV  500 mg Intravenous To PACU  . simvastatin  10 mg Oral QHS  . warfarin  5 mg Oral q1800  . warfarin   Does not apply Once  . Warfarin - Pharmacist Dosing Inpatient   Does not apply q1800  . DISCONTD: losartan-hydrochlorothiazide  1 tablet Oral Daily    Assessment: 71 y.o. female s/p TKA on Coumadin for VTE prophylaxis. POD#1.  INR 1.22  No bleeding reported.   Goal of Therapy:  INR =1.5-2 per Dr. Wadie Lessen note   Plan:  1) Coumadin 3 mg po daily at 1800 pm 2) Daily PT / INR  Thank you.  Arman Filter 10/31/2011,11:10 AM

## 2011-10-31 NOTE — Clinical Documentation Improvement (Signed)
Abnormal Labs Clarification  THIS DOCUMENT IS NOT A PERMANENT PART OF THE MEDICAL RECORD  TO RESPOND TO THE THIS QUERY, FOLLOW THE INSTRUCTIONS BELOW:  1. If needed, update documentation for the patient's encounter via the notes activity.  2. Access this query again and click edit on the [employer].  3. After updating, or not, click F2 to complete all highlighted (required) fields concerning your review. Select "additional documentation in the medical record" OR "no additional documentation provided".  4. Click Sign note button.  5. The deficiency will fall out of your InBasket *Please let us know if you are not able to complete this workflow by phone or e-mail (listed below).  Please update your documentation within the medical record to reflect your response to this query.                                                                                   10/31/11  Dear Dr.  Turner Daniels Marton Redwood  In a better effort to capture your patient's severity of illness, reflect appropriate length of stay and utilization of resources, a review of the medical record has revealed the following indicators.    Abnormal findings (laboratory, x-ray, pathologic, and other diagnostic results) are not coded and reported unless the physician indicates their clinical significance.   The medical record reflects the following clinical findings, please clarify  the diagnostic and/or clinical significance:  PLEASE ADDRESS THE FOLLOWING ABNORMAL LABS IN NOTES/DC SUMMARY.  THANK YOU.      - Patient Values: H/H for 3/11:14.6/41.8, for 3/19:10.3/30.1  - Treatment: Monitor CBC x3 days   Reviewed:  no additional documentation provided  Thank You,  Beverley Fiedler RN Clinical Documentation Specialist: CELL: [phone] Health Information Management [place]

## 2011-10-31 NOTE — Progress Notes (Signed)
UR COMPLETED  

## 2011-10-31 NOTE — Evaluation (Addendum)
Occupational Therapy Evaluation Patient Details Name: Ann Gamble MRN: [id number] DOB: 1940-09-11 Today's Date: 10/31/2011  Problem List:  Patient Active Problem List  Diagnoses  . Arthritis of left knee    Past Medical History:  Past Medical History  Diagnosis Date  . Hypertension     takes Hyzaar and Amlodipine daily  . Hyperlipidemia     takes Simvastatin daily  . Seasonal allergies   . Arthritis   . Sensitive skin     pt states she has highly sensitive skin  . Constipation     takes stool softener nightly  . Hx of colonic polyps   . Urinary leakage   . Cataract immature     right   Past Surgical History:  Past Surgical History  Procedure Date  . Tonsillectomy     at age 60  . Abdominal hysterectomy >75yrs ago    partial  . Colonoscopy   . 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 presents s/p Lt. TKA and with increased pain and beloq problem list. Pt. will benefit from skilled OT to increase functional level of independence to supervision level at D/C. OT Recommendation/Assessment: Patient will need skilled OT in the acute care venue OT Problem List: Decreased activity tolerance;Decreased safety awareness;Decreased knowledge of use of DME or AE;Pain Barriers to Discharge: None OT Therapy Diagnosis : Generalized weakness;Acute pain OT Plan OT Frequency: Min 2X/week OT Treatment/Interventions: Self-care/ADL training;DME and/or AE instruction;Therapeutic activities;Patient/family education;Balance training OT Recommendation Follow Up Recommendations: Supervision - Intermittent;No OT follow up Equipment Recommended: Tub/shower bench Individuals Consulted Consulted and Agree with Results and Recommendations: Patient OT Goals Acute Rehab OT  Goals OT Goal Formulation: With patient Time For Goal Achievement: 7 days ADL Goals Pt Will Perform Lower Body Bathing: with set-up;with supervision;Sit to stand from chair;with adaptive equipment ADL Goal: Lower Body Bathing - Progress: Goal set today Pt Will Perform Lower Body Dressing: with set-up;with supervision;Sit to stand from chair;with adaptive equipment ADL Goal: Lower Body Dressing - Progress: Goal set today Pt Will Transfer to Toilet: with supervision;with DME;Ambulation;3-in-1 ADL Goal: Toilet Transfer - Progress: Goal set today Pt Will Perform Tub/Shower Transfer: Tub transfer;with supervision;with DME;Ambulation;Transfer tub bench ADL Goal: Tub/Shower Transfer - Progress: Goal set today  OT Evaluation Precautions/Restrictions  Precautions Precautions: Knee Precaution Booklet Issued: Yes (comment) Restrictions Weight Bearing Restrictions: Yes LLE Weight Bearing: Weight bearing as tolerated Prior Functioning Home Living Lives With: Family Receives Help From: Family Type of Home: Mobile home Home Layout: One level Home Access: Ramped entrance Bathroom Shower/Tub: Tub/shower unit;Curtain Bathroom Toilet: Handicapped height Bathroom Accessibility: Yes How Accessible: Accessible via walker Home Adaptive Equipment: Walker - rolling;Straight cane;Grab bars in shower Prior Function Level of Independence: Independent with basic ADLs;Independent with homemaking with ambulation;Independent with gait;Independent with transfers Able to Take Stairs?: Yes Driving: Yes Vocation: Retired ADL ADL Eating/Feeding: Performed;Independent Where Assessed - Eating/Feeding: Chair Grooming: Performed;Wash/dry hands;Set up;Supervision/safety Grooming Details (indicate cue type and reason): Min verbal cues for safety with RW Where Assessed - Grooming: Standing at sink Upper Body Bathing: Simulated;Chest;Right arm;Left arm;Abdomen;Set up Where Assessed - Upper Body Bathing: Sitting,  chair Lower Body Bathing: Simulated;Moderate assistance Where Assessed - Lower Body Bathing: Sit to stand from chair Upper Body Dressing: Performed;Minimal assistance Upper Body Dressing Details (indicate cue type and reason): With donning gown  Where Assessed - Upper Body Dressing: Sitting, chair Lower Body Dressing: Simulated;Moderate assistance Where Assessed - Lower Body Dressing: Sit to stand from chair Toilet Transfer: Performed;Minimal assistance Toilet Transfer Method: Ambulating Toilet Transfer Equipment: Bedside commode Toileting - Clothing Manipulation: Performed;Minimal assistance Toileting - Clothing Manipulation Details (indicate cue type and reason): With moving gown Where Assessed - Toileting Clothing Manipulation: Sit on 3-in-1 or toilet Toileting - Hygiene: Performed;Set up Where Assessed - Toileting Hygiene: Sit on 3-in-1 or toilet Tub/Shower Transfer: Not assessed Equipment Used: Rolling walker Ambulation Related to ADLs: Pt. min guard assist with ~10' with RW ADL Comments: Pt. educated on techniques for completing LB ADLs and demonstrated for pt. shower transfer and tub/transfer bench.    Extremity Assessment LUE Assessment LUE Assessment: Within Functional Limits Mobility  Bed Mobility Bed Mobility: No Transfers Sit to Stand: 4: Min assist Sit to Stand Details (indicate cue type and reason): Min cues for safety Stand to Sit: 5: Supervision;With upper extremity assist;To chair/3-in-1 Stand to Sit Details: demos good technique.  Only cues to center pt at chair prior to sitting.     End of Session OT - End of Session Equipment Utilized During Treatment: Gait belt Activity Tolerance: Patient tolerated treatment well Patient left: in chair;with call bell in reach Nurse Communication: Mobility status for transfers General Behavior During Session: [facility] for tasks performed Cognition: [facility] for tasks performed   Verline Kong, OTR/L Pager [phone] 10/31/2011,  2:38 PM

## 2011-10-31 NOTE — Progress Notes (Signed)
Physical Therapy Evaluation  Past Medical History  Diagnosis Date  . Hypertension     takes Hyzaar and Amlodipine daily  . Hyperlipidemia     takes Simvastatin daily  . Seasonal allergies   . Arthritis   . Sensitive skin     pt states she has highly sensitive skin  . Constipation     takes stool softener nightly  . Hx of colonic polyps   . Urinary leakage   . Cataract immature     right   Past Surgical History  Procedure Date  . Tonsillectomy     at age 71  . Abdominal hysterectomy >51yrs ago    partial  . Colonoscopy   . 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    Procedure: TOTAL KNEE ARTHROPLASTY;  Surgeon: Nestor Lewandowsky, MD;  Location: [facility] OR;  Service: Orthopedics;  Laterality: Left;  DEPUY/SIGMA    10/31/11 0800  PT Visit Information  Last PT Received On 10/31/11  Patient Stated Goals  Goal #1 Back active in [facility]  Precautions  Precautions Knee  Precaution Booklet Issued Yes (comment)  Restrictions  Weight Bearing Restrictions Yes  LLE Weight Bearing WBAT  Home Living  Lives With Family  Receives Help From Family  Type of Home Mobile home  Home Layout One level  Home Access Ramped entrance  Bathroom Shower/Tub Tub/shower unit;Curtain  Bathroom Toilet Handicapped height  Bathroom Accessibility Yes  How Accessible Accessible via walker  Home Adaptive Equipment Walker - rolling;Straight cane;Grab bars in shower  Prior Function  Level of Independence Independent with basic ADLs;Independent with homemaking with ambulation;Independent with gait;Independent with transfers  Able to Take Stairs? Yes  Driving Yes  Vocation Retired  Engineer, building services Level Oriented X4  Bed Mobility  Bed Mobility Yes  Supine to Sit 4: Min assist  Supine to Sit Details (indicate cue type and reason) A with L LE only.    Sitting - Scoot to [place] of Bed 4: Min assist  Sitting - Scoot to [place] of Bed Details (indicate cue type and reason) A with L LE only  Transfers  Transfers Yes    Sit to Stand 4: Min assist;With upper extremity assist;From bed  Sit to Stand Details (indicate cue type and reason) cues for use of UEs, positioning of LEs  Stand to Sit 4: Min assist;With upper extremity assist;To chair/3-in-1  Stand to Sit Details cues to control descent, use of UEs, positioning of LEs  Ambulation/Gait  Ambulation/Gait Yes  Ambulation/Gait Assistance 4: Min assist  Ambulation/Gait Assistance Details (indicate cue type and reason) cues for sequencing, use of RW, upright posture, increase WBing on L LE.    Ambulation Distance (Feet) 50 Feet  Assistive device Rolling walker  Gait Pattern Step-to pattern;Decreased step length - right;Decreased stance time - left;Trunk flexed  Stairs No  Wheelchair Mobility  Wheelchair Mobility No  Posture/Postural Control  Posture/Postural Control No significant limitations  Balance  Balance Assessed No  RLE Assessment  RLE Assessment WFL  LLE Assessment  LLE Assessment X  LLE AROM (degrees)  LLE Overall AROM Comments AAROM ~10-70degrees  Exercises  Exercises Total Joint  Total Joint Exercises  Ankle Circles/Pumps AROM;Both;10 reps  Quad Sets AROM;Both;10 reps  Long [address] [place];Left;10 reps  Knee Flexion AAROM;Left;10 reps  PT - End of Session  Equipment Utilized During Treatment Gait belt  Activity Tolerance Patient tolerated treatment well  Patient left in chair;with call bell in reach  Nurse Communication Mobility status for transfers;Mobility status for ambulation  CPM Left Knee  CPM Left Knee Off  General  Behavior During Session [facility] for tasks performed  Cognition [facility] for tasks performed  PT Assessment  Clinical Impression Statement pt presents s/p TKA.  pt very motivated and moving well.  pt plans on D/C to home with family A.    PT Recommendation/Assessment Patient will need skilled PT in the acute care venue  PT Problem List Decreased strength;Decreased range of motion;Decreased activity tolerance;Decreased  balance;Decreased mobility;Decreased knowledge of use of DME;Pain  Barriers to Discharge None  PT Therapy Diagnosis  Abnormality of gait;Acute pain  PT Plan  PT Frequency 7X/week  PT Treatment/Interventions DME instruction;Gait training;Stair training;Functional mobility training;Therapeutic activities;Therapeutic exercise;Balance training;Patient/family education  PT Recommendation  Recommendations for Other Services OT consult  Follow Up Recommendations Home health PT;Supervision - Intermittent  Equipment Recommended None recommended by PT  Individuals Consulted  Consulted and Agree with Results and Recommendations Patient  Acute Rehab PT Goals  PT Goal Formulation With patient  Time For Goal Achievement 7 days  Pt will go Supine/Side to Sit with modified independence  PT Goal: Supine/Side to Sit - Progress Goal set today  Pt will go Sit to Supine/Side with modified independence  PT Goal: Sit to Supine/Side - Progress Goal set today  Pt will go Sit to Stand with modified independence  PT Goal: Sit to Stand - Progress Goal set today  Pt will go Stand to Sit with modified independence  PT Goal: Stand to Sit - Progress Goal set today  Pt will Ambulate >150 feet;with modified independence;with rolling walker  PT Goal: Ambulate - Progress Goal set today  Pt will Go Up / Down Stairs 3-5 stairs;with min assist;with least restrictive assistive device  PT Goal: Up/Down Stairs - Progress Goal set today  Pt will Perform Home Exercise Program Independently  PT Goal: Perform Home Exercise Program - Progress Goal set today    Mack Hook, PT [phone]

## 2011-10-31 NOTE — Progress Notes (Addendum)
Patient ID: Ann Gamble, female   DOB: [birth year], 71 y.o.   MRN: [id number] PATIENT ID: Ann Gamble  MRN: [id number]  DOB/AGE:  Nov 05, 1940 / 71 y.o.  1 Day Post-Op Procedure(s) (LRB): TOTAL KNEE ARTHROPLASTY (Left)    PROGRESS NOTE Subjective: Patient is alert, oriented, no Nausea, no Vomiting, yes} passing gas, no Bowel Movement. Taking PO well. Denies SOB, Chest or Calf Pain. Using Incentive Spirometer, PAS in place. Ambulate wbat, CPM 0-30 Patient reports pain as 3 on 0-10 scale  .    Objective: Vital signs in last 24 hours: Filed Vitals:   10/30/11 2148 10/30/11 2317 10/31/11 0213 10/31/11 0605  BP: 107/73 107/73 131/71 130/78  Pulse: 97  106 103  Temp: 97.5 F (36.4 C)  100.6 F (38.1 C) 101.1 F (38.4 C)  TempSrc:      Resp: 19  19 20   SpO2: 100%  92% 97%      Intake/Output from previous day: I/O last 3 completed shifts: In: 3755 [P.O.:480; I.V.:2950; Other:325] Out: 775 [Urine:250; Drains:475; Blood:50]   Intake/Output this shift:     LABORATORY DATA:  Basename 10/31/11 0525  WBC 9.9  HGB 10.3*  HCT 30.1*  PLT 209  NA 137  K 3.0*  CL 99  CO2 28  BUN 12  CREATININE 0.76  GLUCOSE 111*  GLUCAP --  INR 1.22  CALCIUM 9.0    Examination: Neurologically intact ABD soft Neurovascular intact Sensation intact distally Intact pulses distally Dorsiflexion/Plantar flexion intact Incision: dressing C/D/I No cellulitis present Compartment soft} Blood and plasma separated in drain indicating minimal recent drainage, drain pulled without difficulty.  Assessment:   1 Day Post-Op Procedure(s) (LRB): TOTAL KNEE ARTHROPLASTY (Left) ADDITIONAL DIAGNOSIS:    Plan: PT/OT WBAT, CPM 5/hrs day until ROM 0-90 degrees, then D/C CPM DVT Prophylaxis:  Coumadin bridge target INR 1.5-2.0 DISCHARGE PLAN: Home DISCHARGE NEEDS: HHPT, HHRN, CPM, Walker and 3-in-1 comode seat     Ann Gamble J 10/31/2011, 7:23 AM

## 2011-11-01 LAB — CBC
Platelets: 206 10*3/uL (ref 150–400)
RBC: 3.02 MIL/uL — ABNORMAL LOW (ref 3.87–5.11)
WBC: 12.2 10*3/uL — ABNORMAL HIGH (ref 4.0–10.5)

## 2011-11-01 LAB — PROTIME-INR: Prothrombin Time: 17.5 seconds — ABNORMAL HIGH (ref 11.6–15.2)

## 2011-11-01 MED ORDER — WARFARIN SODIUM 3 MG PO TABS
3.0000 mg | ORAL_TABLET | Freq: Every day | ORAL | Status: AC
  Administered 2011-11-01: 3 mg via ORAL
  Filled 2011-11-01: qty 1

## 2011-11-01 NOTE — Progress Notes (Signed)
Clinical Social Work-CSW received referral-reviewed chart and per MD/PA/CM/RN pt to d/c home with home health. No CSW needs at this time-Oneika Simonian-MSW, [phone]

## 2011-11-01 NOTE — Progress Notes (Signed)
CARE MANAGEMENT NOTE 11/01/2011      Action/Plan:   Spoke with patient, Choice offered. states she was setup with [facility], no changes. Has DME, requesting a tub bench. Entered in [facility]LC.   Anticipated DC Date:  11/02/2011   Anticipated DC Plan:  HOME W HOME HEALTH SERVICES      DC Planning Services  CM consult      PAC Choice  DURABLE MEDICAL EQUIPMENT  HOME HEALTH   Choice offered to / List presented to:  C-1 Patient   DME arranged  TUB BENCH      DME agency  [facility]     HH arranged  HH-2 PT      [facility] agency  [facility]   Status of service:  Completed, signed off  Discharge Disposition:  HOME W HOME HEALTH SERVICES

## 2011-11-01 NOTE — Progress Notes (Signed)
Orthopedic Tech Progress Note Patient Details:  Ann Gamble 12-Apr-1941 [id number]  Patient ID: [name], female   DOB: 03-Mar-1941, 71 y.o.   MRN: [id number]  Appllied overhead frame to bed. [name] T 11/01/2011, 1:15 PM

## 2011-11-01 NOTE — Progress Notes (Signed)
ANTICOAGULATION CONSULT NOTE - Follow Up Consult  Pharmacy Consult for Coumadin Indication: VTE prophylaxis  No Known Allergies  Patient Measurements: 76 kg  Vital Signs: Temp: 98.9 F (37.2 C) (03/20 0510) Temp src: Oral (03/19 2103) BP: 126/77 mmHg (03/20 0510) Pulse Rate: 76  (03/20 0510)  Labs:  Basename 11/01/11 0535 10/31/11 0525  HGB 9.6* 10.3*  HCT 28.8* 30.1*  PLT 206 209  APTT -- --  LABPROT 17.5* 15.7*  INR 1.41 1.22  HEPARINUNFRC -- --  CREATININE -- 0.76  CKTOTAL -- --  CKMB -- --  TROPONINI -- --   Estimated Creatinine Clearance: 60.5 ml/min (by C-G formula based on Cr of 0.76).  Medical History: Past Medical History  Diagnosis Date  . Hypertension     takes Hyzaar and Amlodipine daily  . Hyperlipidemia     takes Simvastatin daily  . Seasonal allergies   . Arthritis   . Sensitive skin     pt states she has highly sensitive skin  . Constipation     takes stool softener nightly  . Hx of colonic polyps   . Urinary leakage   . Cataract immature     right    Medications:  Scheduled:     . amLODipine  5 mg Oral QHS  . docusate sodium  100 mg Oral BID  . enoxaparin  30 mg Subcutaneous Q12H  . hydrochlorothiazide  25 mg Oral Daily  . losartan  100 mg Oral Daily  . methocarbamol(ROBAXIN) IV  500 mg Intravenous To PACU  . simvastatin  10 mg Oral QHS  . warfarin  3 mg Oral ONCE-1800  . warfarin   Does not apply Once  . Warfarin - Pharmacist Dosing Inpatient   Does not apply q1800    Assessment: 71 y.o. female s/p TKA on Coumadin for VTE prophylaxis. POD#2.  INR 1.41  No bleeding reported.   Goal of Therapy:  INR =1.5-2 per Dr. Wadie Lessen note   Plan:  1) Coumadin 3 mg po daily at 1800 pm 2) Daily PT / INR  Thank you.  Rayne Loiseau Poteet 11/01/2011,8:15 AM

## 2011-11-01 NOTE — Progress Notes (Signed)
Occupational Therapy Treatment Patient Details Name: Ann Gamble MRN: [id number] DOB: Apr 10, 1941 Today's Date: 11/01/2011  OT Assessment/Plan OT Assessment/Plan Comments on Treatment Session: Pt. progressing very well with therapy and with minimal pain. OT Plan: Discharge plan remains appropriate OT Frequency: Min 2X/week Follow Up Recommendations: Supervision - Intermittent;No OT follow up Equipment Recommended: Tub/shower bench OT Goals Acute Rehab OT Goals OT Goal Formulation: With patient Time For Goal Achievement: 7 days ADL Goals Pt Will Perform Tub/Shower Transfer: Tub transfer;with supervision;with DME;Ambulation;Transfer tub bench ADL Goal: Tub/Shower Transfer - Progress: Progressing toward goals  OT Treatment Precautions/Restrictions  Precautions Precautions: Knee Precaution Booklet Issued: Yes (comment) Restrictions Weight Bearing Restrictions: Yes LLE Weight Bearing: Weight bearing as tolerated   ADL ADL Tub/Shower Transfer: Simulated;Minimal assistance Tub/Shower Transfer Details (indicate cue type and reason): Mod verbal cues for transfer technique and assist provided for left LE over wall of tub to ensure pt. safety  Tub/Shower Transfer Method: Science writer: Counsellor Used: Rolling walker Ambulation Related to ADLs: Pt. supervision with ~100' RW ADL Comments: Pt. educated on techniques for completing LB ADLs and demonstrated for pt. shower transfer and tub/transfer bench. Mobility  Bed Mobility Bed Mobility: No Transfers Sit to Stand: 5: Supervision;With upper extremity assist;From chair/3-in-1 Sit to Stand Details (indicate cue type and reason): Min verbal cues for hand placement on RW     End of Session OT - End of Session Equipment Utilized During Treatment: Gait belt Activity Tolerance: Patient tolerated treatment well Patient left: in chair;with call bell in reach Nurse Communication: Mobility  status for transfers General Behavior During Session: [facility] for tasks performed Cognition: [facility] for tasks performed  Waller Marcussen, OTR/L Pager [phone]  11/01/2011, 12:18 PM

## 2011-11-01 NOTE — Progress Notes (Signed)
PATIENT ID: Ann Gamble  MRN: [id number]  DOB/AGE:  1941-04-04 / 70 y.o.  2 Days Post-Op Procedure(s) (LRB): TOTAL KNEE ARTHROPLASTY (Left)    PROGRESS NOTE Subjective: Patient is alert, oriented, no Nausea, no Vomiting, yes} passing gas, no Bowel Movement. Taking PO well. Denies SOB, Chest or Calf Pain. Using Incentive Spirometer, PAS in place. Ambulating well with PT. Patient reports pain as moderate  .    Objective: Vital signs in last 24 hours: Filed Vitals:   10/31/11 1420 10/31/11 1542 10/31/11 2103 11/01/11 0510  BP: 105/50 131/57 104/75 126/77  Pulse:  75 110 76  Temp:   100.9 F (38.3 C) 98.9 F (37.2 C)  TempSrc:   Oral   Resp:  20 18 18   Height:      Weight:      SpO2:  93% 94% 97%      Intake/Output from previous day: I/O last 3 completed shifts: In: 4984.2 [P.O.:480; I.V.:4179.2; Other:325] Out: 475 [Urine:200; Drains:275]   Intake/Output this shift: Total I/O In: 120 [P.O.:120] Out: -    LABORATORY DATA:  Basename 11/01/11 0535 10/31/11 0525  WBC 12.2* 9.9  HGB 9.6* 10.3*  HCT 28.8* 30.1*  PLT 206 209  NA -- 137  K -- 3.0*  CL -- 99  CO2 -- 28  BUN -- 12  CREATININE -- 0.76  GLUCOSE -- 111*  GLUCAP -- --  INR 1.41 1.22  CALCIUM -- 9.0    Examination: Neurologically intact ABD soft Neurovascular intact Sensation intact distally Intact pulses distally Dorsiflexion/Plantar flexion intact Incision: dressing C/D/I}  Assessment:   2 Days Post-Op Procedure(s) (LRB): TOTAL KNEE ARTHROPLASTY (Left) ADDITIONAL DIAGNOSIS:  none  Plan: PT/OT WBAT, CPM 5/hrs day until ROM 0-90 degrees, then D/C CPM DVT Prophylaxis:  Coumadin target INR 1.5-2.0 (No Lovenox) DISCHARGE PLAN: Home Thursday DISCHARGE NEEDS: HHPT, HHRN, CPM, Walker and 3-in-1 comode seat     Rifky Lapre M. 11/01/2011, 8:31 AM

## 2011-11-01 NOTE — Progress Notes (Signed)
Physical Therapy Treatment Note   11/01/11 1506  PT Visit Information  Last PT Received On 11/01/11  Precautions  Precautions Knee  Restrictions  LLE Weight Bearing WBAT  Bed Mobility  Supine to Sit 4: Min assist  Supine to Sit Details (indicate cue type and reason) assist for R LE, use of hand rail, HOB at 30 degrees  Sitting - Scoot to Edge of Bed 5: Supervision  Sitting - Scoot to Edge of Bed Details (indicate cue type and reason) increased time required  Sit to Supine 4: Min assist  Sit to Supine - Details (indicate cue type and reason) assist for L LE, pt able to scoot self up in bed with trapeze  Transfers  Sit to Stand 5: Supervision  Sit to Stand Details (indicate cue type and reason) v/cs for hand placement  Stand to Sit 5: Supervision  Ambulation/Gait  Ambulation/Gait Assistance 4: Min assist  Ambulation/Gait Assistance Details (indicate cue type and reason) contact guard,  Ambulation Distance (Feet) 150 Feet  Assistive device Rolling walker  Gait Pattern Step-to pattern;Decreased step length - left;Decreased stance time - left;Antalgic  Stairs No  Total Joint Exercises  Long Arc Quad AROM;5 reps;Left;Seated  Knee Flexion AROM;Left;10 reps (AAROM x 10 to achieve 90 degress of L knee flexion)  PT - End of Session  Equipment Utilized During Treatment Gait belt  Patient left in bed;with call bell in reach  General  Behavior During Session [facility] for tasks performed  Cognition [facility] for tasks performed  PT - Assessment/Plan  Comments on Treatment Session Patient with improved L LE knee ROM and strength in addition to ambulation tolerance this date. Patient demonstrates to be safe to return home with family support and assist 24/7.  PT Plan Discharge plan remains appropriate  PT Frequency 7X/week  Follow Up Recommendations Home health PT  Equipment Recommended Tub/shower bench  Acute Rehab PT Goals  PT Goal: Supine/Side to Sit - Progress Progressing toward goal  PT Goal:  Sit to Supine/Side - Progress Progressing toward goal  PT Goal: Sit to Stand - Progress Progressing toward goal  PT Goal: Stand to Sit - Progress Progressing toward goal  PT Goal: Ambulate - Progress Progressing toward goal  PT Goal: Perform Home Exercise Program - Progress Progressing toward goal    Pain: 7/10 post PT at L knee  Lewis Shock, PT, DPT Pager #: [phone] Office #: [phone]

## 2011-11-01 NOTE — Discharge Instructions (Signed)
Home Health to be provided by [facility] [phone] 

## 2011-11-02 LAB — CBC
MCH: 32 pg (ref 26.0–34.0)
Platelets: 202 10*3/uL (ref 150–400)
RBC: 2.78 MIL/uL — ABNORMAL LOW (ref 3.87–5.11)
WBC: 10.5 10*3/uL (ref 4.0–10.5)

## 2011-11-02 LAB — PROTIME-INR: Prothrombin Time: 16.7 seconds — ABNORMAL HIGH (ref 11.6–15.2)

## 2011-11-02 MED ORDER — METHOCARBAMOL 500 MG PO TABS: 500.0000 mg | ORAL_TABLET | Freq: Four times a day (QID) | ORAL | Status: AC | PRN

## 2011-11-02 MED ORDER — OXYCODONE-ACETAMINOPHEN 5-325 MG PO TABS: 1.0000 | ORAL_TABLET | ORAL | Status: AC | PRN

## 2011-11-02 MED ORDER — WARFARIN SODIUM 5 MG PO TABS: 5.0000 mg | ORAL_TABLET | Freq: Every day | ORAL | Status: AC

## 2011-11-02 NOTE — Progress Notes (Signed)
Physical Therapy Treatment Note   11/02/11 0900  PT Visit Information  Last PT Received On 11/01/11  Precautions  Precautions Knee  Required Braces or Orthoses No  Restrictions  LLE Weight Bearing WBAT  Bed Mobility  Supine to Sit 5: Supervision;HOB flat  Supine to Sit Details (indicate cue type and reason) practiced w/o rails to mimic home, increased time required but pt able to complete  Sitting - Scoot to Edge of Bed 5: Supervision  Transfers  Sit to Stand 5: Supervision  Sit to Stand Details (indicate cue type and reason) v/cs for hand placement  Stand to Sit 5: Supervision  Ambulation/Gait  Ambulation/Gait Assistance 5: Supervision  Ambulation/Gait Assistance Details (indicate cue type and reason) v/cs to sequence reciprocal gait pattern with RW and shorter step length, pt did well and had minimal pain  Ambulation Distance (Feet) 150 Feet  Assistive device Rolling walker  Gait Pattern Step-through pattern;Decreased stride length  Gait velocity improved from yesterday, [facility] for surgery  Total Joint Exercises  Quad Sets AROM;Left;10 reps;Supine  Long Arc Quad AROM;10 reps;Seated;Left  Knee Flexion AROM;Left;20 reps;Seated (with towel beneath foot)  Heel Slides Left;AROM;10 reps;Supine (achieved approx 30 deg active R knee flex)  PT - End of Session  Patient left in chair;with call bell in reach  General  Behavior During Session [facility] for tasks performed  Cognition [facility] for tasks performed  PT - Assessment/Plan  Comments on Treatment Session patient denies pain in L knee except for occasional "shot" of pain if she took to big of step. Patient demonstrates safe transfes and ambulation and is safe to d/c home when approved by MD. Patient with good home set up and support.  PT Plan Discharge plan remains appropriate  PT Frequency 7X/week  Follow Up Recommendations Home health PT;Supervision - Intermittent  Equipment Recommended Tub/shower bench  Acute Rehab PT Goals  PT Goal:  Supine/Side to Sit - Progress Progressing toward goal  PT Goal: Sit to Supine/Side - Progress Progressing toward goal  PT Goal: Sit to Stand - Progress Progressing toward goal  PT Goal: Stand to Sit - Progress Progressing toward goal  PT Goal: Ambulate - Progress Progressing toward goal  PT Goal: Perform Home Exercise Program - Progress Progressing toward goal    Lewis Shock, PT, DPT Pager #: [phone] Office #: [phone]

## 2011-11-02 NOTE — Progress Notes (Signed)
PATIENT ID: Ann Gamble  MRN: [id number]  DOB/AGE:  1941/06/11 / 71 y.o.  3 Days Post-Op Procedure(s) (LRB): TOTAL KNEE ARTHROPLASTY (Left)    PROGRESS NOTE Subjective: Patient is alert, oriented, no Nausea, no Vomiting, yes} passing gas, no Bowel Movement. Taking PO well. Denies SOB, Chest or Calf Pain. Using Incentive Spirometer, PAS in place. Ambulating well with PT. Patient reports pain as moderate  .    Objective: Vital signs in last 24 hours: Filed Vitals:   11/01/11 1626 11/01/11 2126 11/02/11 0610 11/02/11 0659  BP: 128/51 147/64 106/77   Pulse: 107 82 120   Temp: 98.9 F (37.2 C) 98.5 F (36.9 C) 100.6 F (38.1 C) 99.2 F (37.3 C)  TempSrc:  Oral    Resp: 16 18 18    Height:      Weight:      SpO2: 88% 92% 100%       Intake/Output from previous day: I/O last 3 completed shifts: In: 2454.2 [P.O.:600; I.V.:1854.2] Out: -    Intake/Output this shift:     LABORATORY DATA:  Basename 11/02/11 0545 11/01/11 0535 10/31/11 0525  WBC 10.5 12.2* --  HGB 8.9* 9.6* --  HCT 26.7* 28.8* --  PLT 202 206 --  NA -- -- 137  K -- -- 3.0*  CL -- -- 99  CO2 -- -- 28  BUN -- -- 12  CREATININE -- -- 0.76  GLUCOSE -- -- 111*  GLUCAP -- -- --  INR 1.33 1.41 --  CALCIUM -- -- 9.0    Examination: Neurologically intact ABD soft Neurovascular intact Sensation intact distally Intact pulses distally Dorsiflexion/Plantar flexion intact Incision: dressing C/D/I}  Assessment:   3 Days Post-Op Procedure(s) (LRB): TOTAL KNEE ARTHROPLASTY (Left) ADDITIONAL DIAGNOSIS:  Acute Blood Loss Anemia  Plan: PT/OT WBAT, CPM 5/hrs day until ROM 0-90 degrees, then D/C CPM DVT Prophylaxis:  Lovenox\Coumadin bridge target INR 1.5-2.0 DISCHARGE PLAN: Home today DISCHARGE NEEDS: HHPT, HHRN, CPM, Walker and 3-in-1 comode seat     Exie Chrismer M. 11/02/2011, 7:58 AM

## 2011-11-02 NOTE — Discharge Summary (Signed)
Patient ID: Ann Gamble MRN: [id number] DOB/AGE: Apr 12, 1941 71 y.o.  Admit date: 10/30/2011 Discharge date: 11/02/2011  Admission Diagnoses:  Principal Problem:  *Arthritis of left knee   Discharge Diagnoses:  Same  Past Medical History  Diagnosis Date  . Hypertension     takes Hyzaar and Amlodipine daily  . Hyperlipidemia     takes Simvastatin daily  . Seasonal allergies   . Arthritis   . Sensitive skin     pt states she has highly sensitive skin  . Constipation     takes stool softener nightly  . Hx of colonic polyps   . Urinary leakage   . Cataract immature     right    Surgeries: Procedure(s): TOTAL KNEE ARTHROPLASTY on 10/30/2011   Consultants:    Discharged Condition: Improved  Hospital Course: Ann Gamble is an 71 y.o. female who was admitted 10/30/2011 for operative treatment ofArthritis of left knee. Patient has severe unremitting pain that affects sleep, daily activities, and work/hobbies. After pre-op clearance the patient was taken to the operating room on 10/30/2011 and underwent  Procedure(s): TOTAL KNEE ARTHROPLASTY.    Patient was given perioperative antibiotics: Anti-infectives     Start     Dose/Rate Route Frequency Ordered Stop   10/30/11 1203   cefUROXime (ZINACEF) injection  Status:  Discontinued          As needed 10/30/11 1204 10/30/11 1249   10/30/11 0800   ceFAZolin (ANCEF) IVPB 2 g/50 mL premix        2 g 100 mL/hr over 30 Minutes Intravenous 60 min pre-op 10/29/11 1409 10/30/11 1123           Patient was given sequential compression devices, early ambulation, and chemoprophylaxis to prevent DVT.  Patient benefited maximally from hospital stay and there were no complications.    Recent vital signs: Patient Vitals for the past 24 hrs:  BP Temp Temp src Pulse Resp SpO2  11/02/11 0659 - 99.2 F (37.3 C) - - - -  11/02/11 0610 106/77 mmHg 100.6 F (38.1 C) - 120  18  100 %  11-19-11 2126 147/64 mmHg 98.5 F (36.9 C)  Oral 82  18  92 %  November 19, 2011 1626 128/51 mmHg 98.9 F (37.2 C) - 107  16  88 %  November 19, 2011 1242 96/56 mmHg 98.5 F (36.9 C) Oral 108  18  90 %  11-19-2011 0845 130/70 mmHg 99.3 F (37.4 C) Oral 118  18  93 %     Recent laboratory studies:  Basename 11/02/11 0545 November 19, 2011 0535 10/31/11 0525  WBC 10.5 12.2* --  HGB 8.9* 9.6* --  HCT 26.7* 28.8* --  PLT 202 206 --  NA -- -- 137  K -- -- 3.0*  CL -- -- 99  CO2 -- -- 28  BUN -- -- 12  CREATININE -- -- 0.76  GLUCOSE -- -- 111*  INR 1.33 1.41 --  CALCIUM -- -- 9.0     Discharge Medications:   Medication List  As of 11/02/2011  8:01 AM   STOP taking these medications         aspirin EC 81 MG tablet         TAKE these medications         amLODipine 5 MG tablet   Commonly known as: NORVASC   Take 5 mg by mouth at bedtime.      losartan-hydrochlorothiazide 100-25 MG per tablet   Commonly known as: HYZAAR  Take 1 tablet by mouth daily.      methocarbamol 500 MG tablet   Commonly known as: ROBAXIN   Take 1 tablet (500 mg total) by mouth every 6 (six) hours as needed.      oxyCODONE-acetaminophen 5-325 MG per tablet   Commonly known as: PERCOCET   Take 1-2 tablets by mouth every 4 (four) hours as needed.      simvastatin 10 MG tablet   Commonly known as: ZOCOR   Take 10 mg by mouth at bedtime.      STOOL SOFTENER PO   Take 1 capsule by mouth daily.      warfarin 5 MG tablet   Commonly known as: COUMADIN   Take 1 tablet (5 mg total) by mouth daily.            Diagnostic Studies: Dg Chest 2 View  10/23/2011  *RADIOLOGY REPORT*  Clinical Data: Preop for total knee arthroplasty  CHEST - 2 VIEW  Comparison: None  Findings: The heart size and mediastinal contours are within normal limits.  Both lungs are clear.  There is mild multilevel degenerative disc disease noted within the spine.  IMPRESSION: Negative exam.  Original Report Authenticated By: Rosealee Albee, M.D.    Disposition: 06-Home-Health Care  Svc  Discharge Orders    Future Orders Please Complete By Expires   Increase activity slowly      Walker       May shower / Bathe      Driving Restrictions      Comments:   No driving for 2 weeks.   Change dressing (specify)      Comments:   Dressing change as needed.   Call MD for:  temperature >100.4      Call MD for:  severe uncontrolled pain      Call MD for:  redness, tenderness, or signs of infection (pain, swelling, redness, odor or green/yellow discharge around incision site)      Discharge instructions      Comments:   F/U with Dr. Turner Daniels in 10 days         Signed: Hazle Nordmann. 11/02/2011, 8:01 AM

## 2011-11-02 NOTE — Progress Notes (Signed)
ANTICOAGULATION CONSULT NOTE - Follow Up Consult  Pharmacy Consult for Coumadin Indication: VTE prophylaxis s/p L TKA  No Known Allergies  Patient Measurements: 76 kg  Vital Signs: Temp: 99.2 F (37.3 C) (03/21 0659) Temp src: Oral (03/20 2126) BP: 106/77 mmHg (03/21 0610) Pulse Rate: 120  (03/21 0610)  Labs:  Basename 11/02/11 0545 11/01/11 0535 10/31/11 0525  HGB 8.9* 9.6* --  HCT 26.7* 28.8* 30.1*  PLT 202 206 209  APTT -- -- --  LABPROT 16.7* 17.5* 15.7*  INR 1.33 1.41 1.22  HEPARINUNFRC -- -- --  CREATININE -- -- 0.76  CKTOTAL -- -- --  CKMB -- -- --  TROPONINI -- -- --   Estimated Creatinine Clearance: 60.5 ml/min (by C-G formula based on Cr of 0.76).  Assessment: 71 y.o. female s/p L TKA on Coumadin for VTE prophylaxis.  INR 1.33- no real movement on 3mg  daily.  No bleeding reported. Noted pt to be d/c home today.  Goal of Therapy:  INR =1.5-2 per Dr. Wadie Lessen note   Plan:  1) Noted plan home on coumadin 5mg  daily with INR monitoring per [facility].  Christoper Fabian, PharmD, BCPS Clinical pharmacist, pager [phone] 11/02/2011,8:48 AM

## 2011-11-02 NOTE — Progress Notes (Signed)
Pt is s/p L TKR. Knee precautions. +cms. WBAT with RW. Ace dressing to L knee is dry and intact. Ice prn to L knee. +cms. Pt repts that her dressing was changed yesterday per PA. Pt ambulates with RW and one assist. CPM per order. Neuro check is negative. Pt is alert and oriented x 3. Pt lungs CTA. No s/sx resp or cardiac distress and no c/o such. Vital signs stable except pt heart rate noted to be in the low 100's. Pt states, "It is normal for me for my heart to be fast." Heart rate regular rate and rhythm. Pt repts a history of urinary freq and urgency. Pt is continent of bowel and bladder. Abdomen is soft flat nontender and nondistended. Pt tolerates diet fair to good. BS+x4. Pt denies nausea or vomiting and pt repts passing gas. Pt repts LBM 3/17. Pt declines laxative at this time. Pt retps, "I will take care of that when I get home today".

## 2013-11-08 IMAGING — CR DG CHEST 2V
2 series · 2 of 2 positions shown · non-contrast
Comparison: None

CLINICAL DATA: Preop for total knee arthroplasty

CHEST - 2 VIEW

[view not recorded (1 of 2)]
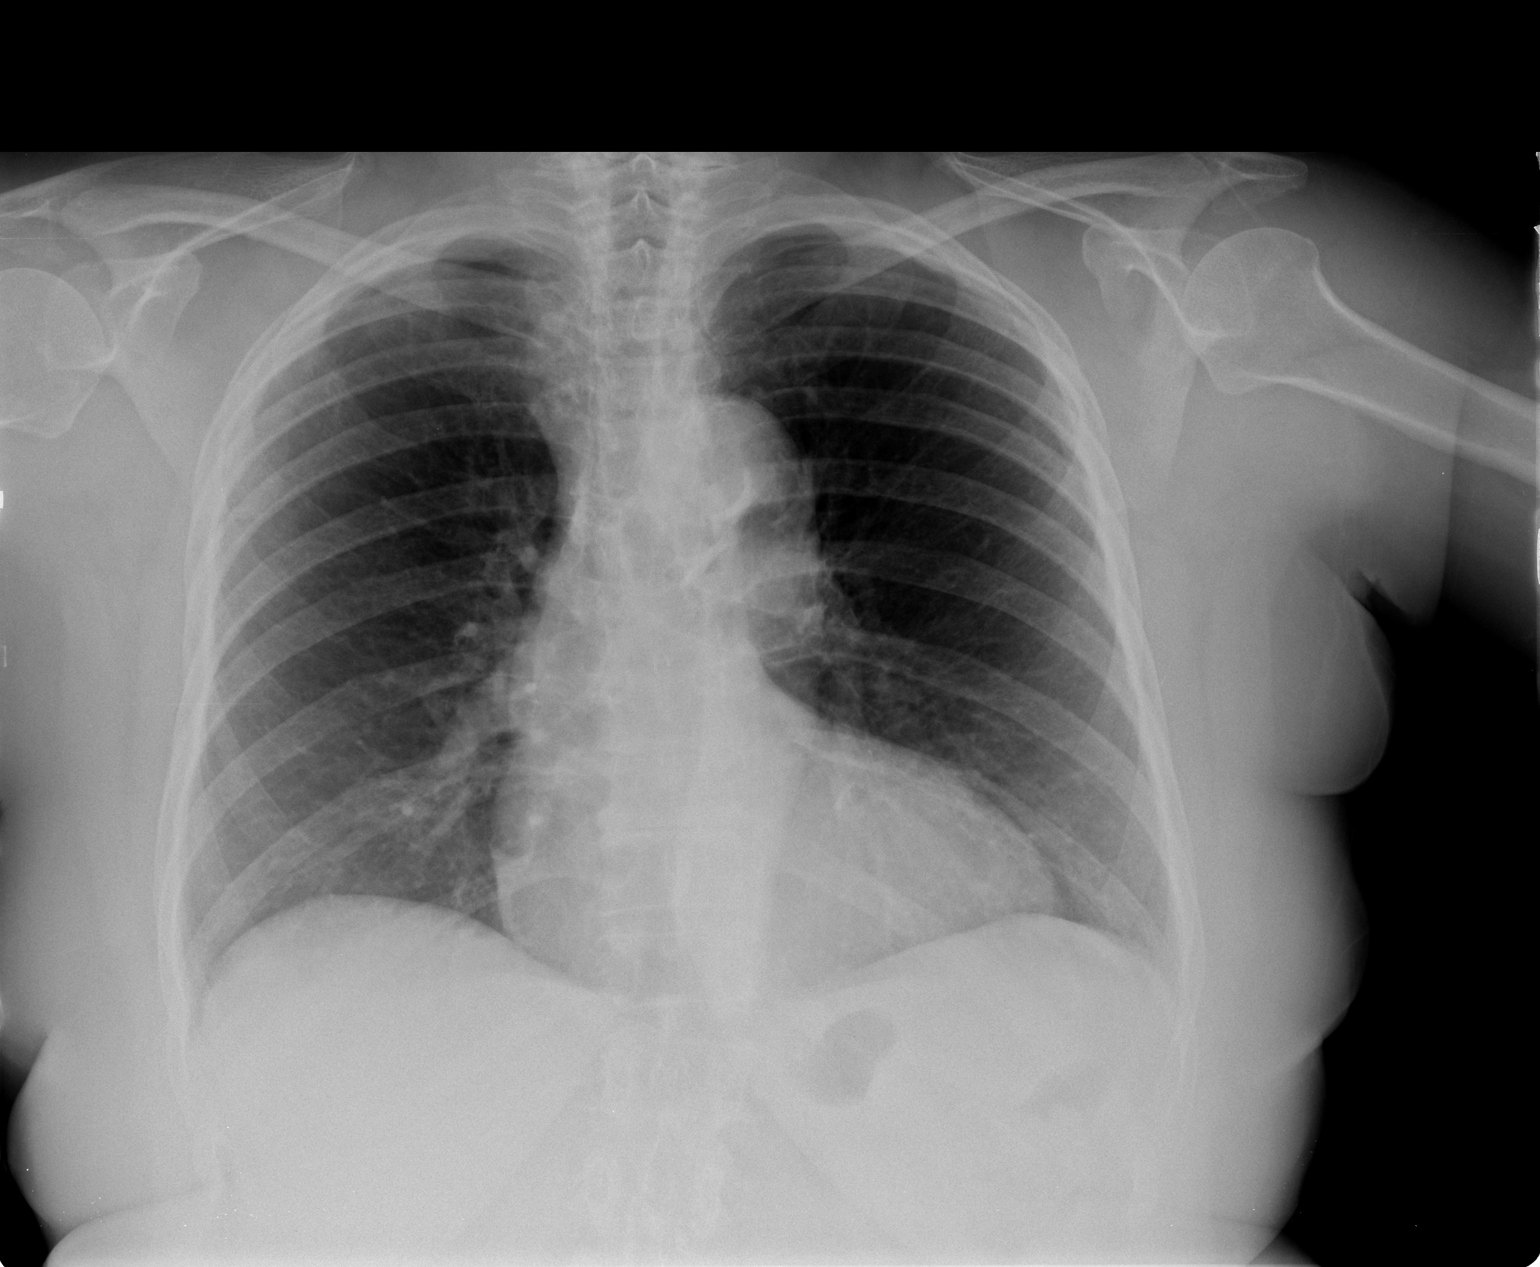

[view not recorded (2 of 2)]
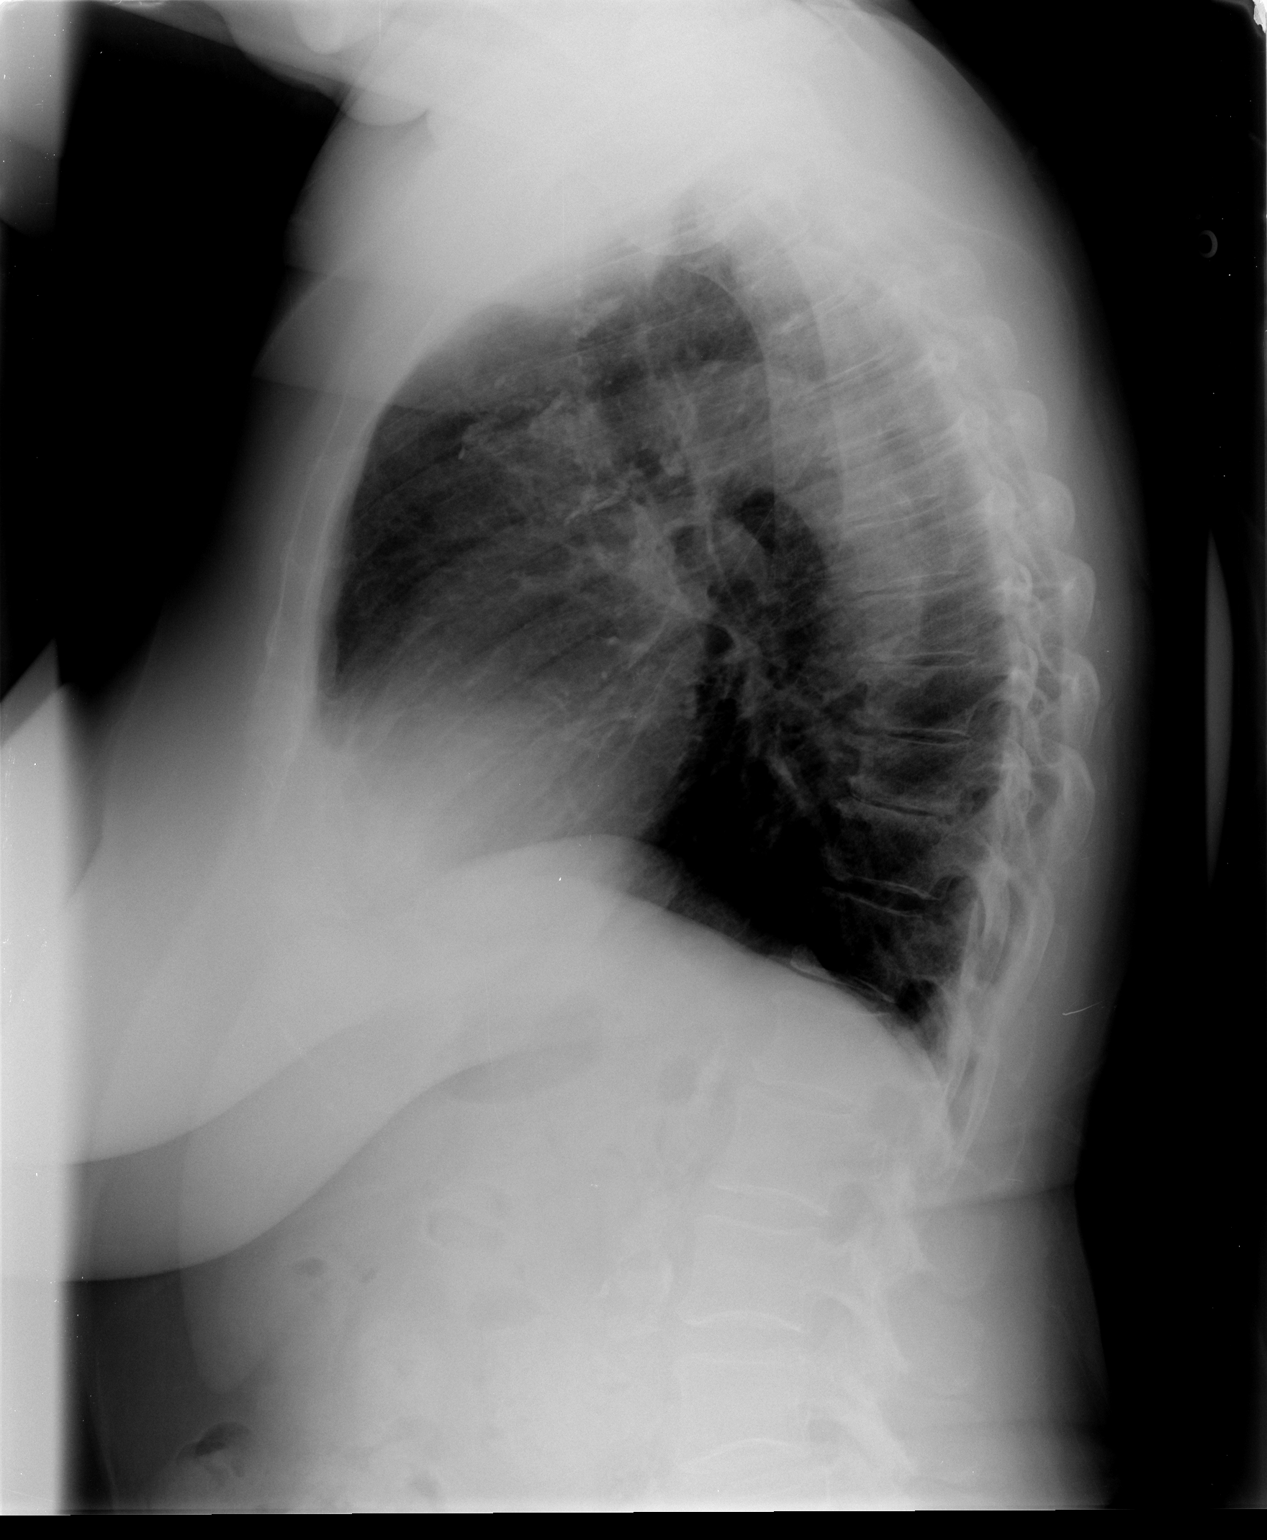

[2 of 2 positions shown; findings below may reference images not displayed]

FINDINGS: The heart size and mediastinal contours are within normal
limits.  Both lungs are clear.  There is mild multilevel
degenerative disc disease noted within the spine.
IMPRESSION: Negative exam.

## 2014-09-10 DIAGNOSIS — H1131 Conjunctival hemorrhage, right eye: Secondary | ICD-10-CM | POA: Diagnosis not present

## 2014-09-10 DIAGNOSIS — Z6834 Body mass index (BMI) 34.0-34.9, adult: Secondary | ICD-10-CM | POA: Diagnosis not present

## 2014-11-11 DIAGNOSIS — Z1389 Encounter for screening for other disorder: Secondary | ICD-10-CM | POA: Diagnosis not present

## 2014-11-11 DIAGNOSIS — Z0000 Encounter for general adult medical examination without abnormal findings: Secondary | ICD-10-CM | POA: Diagnosis not present

## 2014-11-11 DIAGNOSIS — Z139 Encounter for screening, unspecified: Secondary | ICD-10-CM | POA: Diagnosis not present

## 2015-01-20 DIAGNOSIS — I10 Essential (primary) hypertension: Secondary | ICD-10-CM | POA: Diagnosis not present

## 2015-01-20 DIAGNOSIS — R7301 Impaired fasting glucose: Secondary | ICD-10-CM | POA: Diagnosis not present

## 2015-01-20 DIAGNOSIS — E784 Other hyperlipidemia: Secondary | ICD-10-CM | POA: Diagnosis not present

## 2015-01-27 DIAGNOSIS — E876 Hypokalemia: Secondary | ICD-10-CM | POA: Diagnosis not present

## 2015-01-27 DIAGNOSIS — R32 Unspecified urinary incontinence: Secondary | ICD-10-CM | POA: Diagnosis not present

## 2015-01-27 DIAGNOSIS — E784 Other hyperlipidemia: Secondary | ICD-10-CM | POA: Diagnosis not present

## 2015-01-27 DIAGNOSIS — I10 Essential (primary) hypertension: Secondary | ICD-10-CM | POA: Diagnosis not present

## 2015-01-27 DIAGNOSIS — R7301 Impaired fasting glucose: Secondary | ICD-10-CM | POA: Diagnosis not present

## 2015-06-03 DIAGNOSIS — E784 Other hyperlipidemia: Secondary | ICD-10-CM | POA: Diagnosis not present

## 2015-06-03 DIAGNOSIS — I10 Essential (primary) hypertension: Secondary | ICD-10-CM | POA: Diagnosis not present

## 2015-06-03 DIAGNOSIS — Z23 Encounter for immunization: Secondary | ICD-10-CM | POA: Diagnosis not present

## 2015-06-03 DIAGNOSIS — R7301 Impaired fasting glucose: Secondary | ICD-10-CM | POA: Diagnosis not present

## 2015-09-22 DIAGNOSIS — I10 Essential (primary) hypertension: Secondary | ICD-10-CM | POA: Diagnosis not present

## 2015-09-22 DIAGNOSIS — R7303 Prediabetes: Secondary | ICD-10-CM | POA: Diagnosis not present

## 2015-09-22 DIAGNOSIS — E784 Other hyperlipidemia: Secondary | ICD-10-CM | POA: Diagnosis not present

## 2015-09-22 DIAGNOSIS — M069 Rheumatoid arthritis, unspecified: Secondary | ICD-10-CM | POA: Diagnosis not present

## 2015-11-25 DIAGNOSIS — M79671 Pain in right foot: Secondary | ICD-10-CM | POA: Diagnosis not present

## 2015-11-25 DIAGNOSIS — M79672 Pain in left foot: Secondary | ICD-10-CM | POA: Diagnosis not present

## 2015-11-25 DIAGNOSIS — M79641 Pain in right hand: Secondary | ICD-10-CM | POA: Diagnosis not present

## 2015-11-25 DIAGNOSIS — M2550 Pain in unspecified joint: Secondary | ICD-10-CM | POA: Diagnosis not present

## 2015-11-25 DIAGNOSIS — M170 Bilateral primary osteoarthritis of knee: Secondary | ICD-10-CM | POA: Diagnosis not present

## 2015-11-25 DIAGNOSIS — Z79899 Other long term (current) drug therapy: Secondary | ICD-10-CM | POA: Diagnosis not present

## 2015-11-25 DIAGNOSIS — M79642 Pain in left hand: Secondary | ICD-10-CM | POA: Diagnosis not present

## 2015-11-28 DIAGNOSIS — S161XXA Strain of muscle, fascia and tendon at neck level, initial encounter: Secondary | ICD-10-CM | POA: Diagnosis not present

## 2015-12-15 DIAGNOSIS — M170 Bilateral primary osteoarthritis of knee: Secondary | ICD-10-CM | POA: Diagnosis not present

## 2015-12-15 DIAGNOSIS — Z09 Encounter for follow-up examination after completed treatment for conditions other than malignant neoplasm: Secondary | ICD-10-CM | POA: Diagnosis not present

## 2015-12-15 DIAGNOSIS — M0579 Rheumatoid arthritis with rheumatoid factor of multiple sites without organ or systems involvement: Secondary | ICD-10-CM | POA: Diagnosis not present

## 2015-12-15 DIAGNOSIS — M19041 Primary osteoarthritis, right hand: Secondary | ICD-10-CM | POA: Diagnosis not present

## 2016-01-13 DIAGNOSIS — Z79899 Other long term (current) drug therapy: Secondary | ICD-10-CM | POA: Diagnosis not present

## 2016-01-18 DIAGNOSIS — N950 Postmenopausal bleeding: Secondary | ICD-10-CM | POA: Diagnosis not present

## 2016-01-18 DIAGNOSIS — R7303 Prediabetes: Secondary | ICD-10-CM | POA: Diagnosis not present

## 2016-01-18 DIAGNOSIS — E784 Other hyperlipidemia: Secondary | ICD-10-CM | POA: Diagnosis not present

## 2016-01-18 DIAGNOSIS — I10 Essential (primary) hypertension: Secondary | ICD-10-CM | POA: Diagnosis not present

## 2016-02-16 DIAGNOSIS — Z1231 Encounter for screening mammogram for malignant neoplasm of breast: Secondary | ICD-10-CM | POA: Diagnosis not present

## 2016-03-22 DIAGNOSIS — Z79899 Other long term (current) drug therapy: Secondary | ICD-10-CM | POA: Diagnosis not present

## 2016-04-25 DIAGNOSIS — M0579 Rheumatoid arthritis with rheumatoid factor of multiple sites without organ or systems involvement: Secondary | ICD-10-CM | POA: Diagnosis not present

## 2016-04-25 DIAGNOSIS — Z09 Encounter for follow-up examination after completed treatment for conditions other than malignant neoplasm: Secondary | ICD-10-CM | POA: Diagnosis not present

## 2016-04-25 DIAGNOSIS — M79641 Pain in right hand: Secondary | ICD-10-CM | POA: Diagnosis not present

## 2016-04-25 DIAGNOSIS — M25561 Pain in right knee: Secondary | ICD-10-CM | POA: Diagnosis not present

## 2016-05-13 DIAGNOSIS — K219 Gastro-esophageal reflux disease without esophagitis: Secondary | ICD-10-CM | POA: Diagnosis not present

## 2016-05-13 DIAGNOSIS — I10 Essential (primary) hypertension: Secondary | ICD-10-CM | POA: Diagnosis not present

## 2016-05-13 DIAGNOSIS — R1012 Left upper quadrant pain: Secondary | ICD-10-CM | POA: Diagnosis not present

## 2016-05-13 DIAGNOSIS — E7800 Pure hypercholesterolemia, unspecified: Secondary | ICD-10-CM | POA: Diagnosis not present

## 2016-05-13 DIAGNOSIS — R1013 Epigastric pain: Secondary | ICD-10-CM | POA: Diagnosis not present

## 2016-05-17 ENCOUNTER — Ambulatory Visit (INDEPENDENT_AMBULATORY_CARE_PROVIDER_SITE_OTHER): Payer: Commercial Managed Care - HMO | Admitting: Rheumatology

## 2016-05-17 DIAGNOSIS — M79641 Pain in right hand: Secondary | ICD-10-CM | POA: Diagnosis not present

## 2016-05-17 DIAGNOSIS — M79642 Pain in left hand: Secondary | ICD-10-CM | POA: Diagnosis not present

## 2016-06-20 DIAGNOSIS — H2511 Age-related nuclear cataract, right eye: Secondary | ICD-10-CM | POA: Diagnosis not present

## 2016-06-20 DIAGNOSIS — H40013 Open angle with borderline findings, low risk, bilateral: Secondary | ICD-10-CM | POA: Diagnosis not present

## 2016-06-27 DIAGNOSIS — Z6829 Body mass index (BMI) 29.0-29.9, adult: Secondary | ICD-10-CM | POA: Diagnosis not present

## 2016-06-27 DIAGNOSIS — K219 Gastro-esophageal reflux disease without esophagitis: Secondary | ICD-10-CM | POA: Diagnosis not present

## 2016-07-11 DIAGNOSIS — Z79899 Other long term (current) drug therapy: Secondary | ICD-10-CM | POA: Diagnosis not present

## 2016-07-11 DIAGNOSIS — Z87891 Personal history of nicotine dependence: Secondary | ICD-10-CM | POA: Diagnosis not present

## 2016-07-11 DIAGNOSIS — Z7982 Long term (current) use of aspirin: Secondary | ICD-10-CM | POA: Diagnosis not present

## 2016-07-11 DIAGNOSIS — I10 Essential (primary) hypertension: Secondary | ICD-10-CM | POA: Diagnosis not present

## 2016-07-11 DIAGNOSIS — E785 Hyperlipidemia, unspecified: Secondary | ICD-10-CM | POA: Diagnosis not present

## 2016-07-11 DIAGNOSIS — K219 Gastro-esophageal reflux disease without esophagitis: Secondary | ICD-10-CM | POA: Diagnosis not present

## 2016-07-11 DIAGNOSIS — H2511 Age-related nuclear cataract, right eye: Secondary | ICD-10-CM | POA: Diagnosis not present

## 2016-07-13 DIAGNOSIS — H25811 Combined forms of age-related cataract, right eye: Secondary | ICD-10-CM | POA: Diagnosis not present

## 2016-07-20 DIAGNOSIS — K219 Gastro-esophageal reflux disease without esophagitis: Secondary | ICD-10-CM | POA: Diagnosis not present

## 2016-07-20 DIAGNOSIS — R7303 Prediabetes: Secondary | ICD-10-CM | POA: Diagnosis not present

## 2016-07-20 DIAGNOSIS — E784 Other hyperlipidemia: Secondary | ICD-10-CM | POA: Diagnosis not present

## 2016-07-20 DIAGNOSIS — I10 Essential (primary) hypertension: Secondary | ICD-10-CM | POA: Diagnosis not present

## 2016-08-15 DIAGNOSIS — Z6829 Body mass index (BMI) 29.0-29.9, adult: Secondary | ICD-10-CM | POA: Diagnosis not present

## 2016-08-15 DIAGNOSIS — H269 Unspecified cataract: Secondary | ICD-10-CM | POA: Diagnosis not present

## 2016-08-15 DIAGNOSIS — K2950 Unspecified chronic gastritis without bleeding: Secondary | ICD-10-CM | POA: Diagnosis not present

## 2016-09-04 DIAGNOSIS — M19071 Primary osteoarthritis, right ankle and foot: Secondary | ICD-10-CM | POA: Insufficient documentation

## 2016-09-04 DIAGNOSIS — Z96652 Presence of left artificial knee joint: Secondary | ICD-10-CM | POA: Insufficient documentation

## 2016-09-04 DIAGNOSIS — M19072 Primary osteoarthritis, left ankle and foot: Secondary | ICD-10-CM

## 2016-09-04 DIAGNOSIS — M0609 Rheumatoid arthritis without rheumatoid factor, multiple sites: Secondary | ICD-10-CM | POA: Insufficient documentation

## 2016-09-04 DIAGNOSIS — M19041 Primary osteoarthritis, right hand: Secondary | ICD-10-CM | POA: Insufficient documentation

## 2016-09-04 DIAGNOSIS — I10 Essential (primary) hypertension: Secondary | ICD-10-CM | POA: Insufficient documentation

## 2016-09-04 DIAGNOSIS — M1711 Unilateral primary osteoarthritis, right knee: Secondary | ICD-10-CM | POA: Insufficient documentation

## 2016-09-04 DIAGNOSIS — E785 Hyperlipidemia, unspecified: Secondary | ICD-10-CM | POA: Insufficient documentation

## 2016-09-04 DIAGNOSIS — M19042 Primary osteoarthritis, left hand: Secondary | ICD-10-CM

## 2016-09-04 DIAGNOSIS — Z79899 Other long term (current) drug therapy: Secondary | ICD-10-CM | POA: Insufficient documentation

## 2016-09-04 NOTE — Progress Notes (Signed)
Office Visit Note  Patient: Ann Gamble             Date of Birth: 1941-03-28           MRN: [id number]             PCP: No primary care provider on file. Referring: No ref. provider found Visit Date: 09/05/2016 Occupation: @GUAROCC @    Subjective:  Hand pain  History of Present Illness: Ann Gamble is a 76 y.o. female with history of sero positive rheumatoid arthritis. She states she's been having some intermittent pain and swelling in her bilateral hands. She has noticed that her left second digit does not close completely. She has decreased grip strength. She has noticed improvement on Plaquenil. Right knee joint has osteoarthritis and continues to have discomfort. Her left total knee replacement is doing well. She also has some stiffness in her feet. She also has reports some stiffness in her C-spine.  Activities of Daily Living:  Patient reports morning stiffness for5-10 minutes. In the left replaced knee  Patient Reports nocturnal pain. In the left replaced knee Difficulty dressing/grooming: Denies Difficulty climbing stairs: Denies Difficulty getting out of chair: Denies Difficulty using hands for taps, buttons, cutlery, and/or writing: Denies   Review of Systems  Constitutional: Negative for fatigue, night sweats, weight gain, weight loss and weakness.  HENT: Positive for mouth dryness. Negative for mouth sores, trouble swallowing, trouble swallowing and nose dryness.   Eyes: Negative for pain, redness, visual disturbance and dryness.  Respiratory: Negative for cough, shortness of breath and difficulty breathing.   Cardiovascular: Negative for chest pain, palpitations, hypertension, irregular heartbeat and swelling in legs/feet.  Gastrointestinal: Positive for constipation and heartburn. Negative for blood in stool and diarrhea.  Endocrine: Negative for increased urination.  Genitourinary: Negative for vaginal dryness.  Musculoskeletal: Positive for  arthralgias, joint pain, joint swelling and morning stiffness. Negative for myalgias, muscle weakness, muscle tenderness and myalgias.  Skin: Negative for color change, rash, hair loss, skin tightness, ulcers and sensitivity to sunlight.  Allergic/Immunologic: Negative for susceptible to infections.  Neurological: Negative for dizziness, memory loss and night sweats.  Hematological: Negative for swollen glands.  Psychiatric/Behavioral: Positive for sleep disturbance. Negative for depressed mood. The patient is not nervous/anxious.     PMFS History:  Patient Active Problem List   Diagnosis Date Noted  . Rheumatoid arthritis of multiple sites with negative rheumatoid factor ([place]) 09/04/2016  . High risk medication use 09/04/2016  . Primary osteoarthritis of right knee 09/04/2016  . History of total knee replacement, left 09/04/2016  . Primary osteoarthritis of both hands 09/04/2016  . Primary osteoarthritis of both feet 09/04/2016  . Essential hypertension 09/04/2016  . Dyslipidemia 09/04/2016  . Arthritis of left knee 10/30/2011    Past Medical History:  Diagnosis Date  . Arthritis   . Cataract immature    right  . Constipation    takes stool softener nightly  . Hx of colonic polyps   . Hyperlipidemia    takes Simvastatin daily  . Hypertension    takes Hyzaar and Amlodipine daily  . Seasonal allergies   . Sensitive skin    pt states she has highly sensitive skin  . Urinary leakage     Family History  Problem Relation Age of Onset  . Anesthesia problems Neg Hx   . Hypotension Neg Hx   . Pseudochol deficiency Neg Hx   . Malignant hyperthermia Neg Hx    Past Surgical History:  Procedure Laterality Date  . COLONOSCOPY    . TONSILLECTOMY  1960  . TOTAL KNEE ARTHROPLASTY  10/30/2011   Procedure: TOTAL KNEE ARTHROPLASTY;  Surgeon: Kerin Salen, MD;  Location: [facility] [address];  Service: Orthopedics;  Laterality: Left;  DEPUY/SIGMA  . VAGINAL HYSTERECTOMY  1970's   partial   Social  History   Social History Narrative  . No narrative on file     Objective: Vital Signs: BP 128/72   Pulse 74   Resp 14   Ht 5' 1.5" (1.562 m)   Wt 154 lb (69.9 kg)   BMI 28.63 kg/m    Physical Exam  Constitutional: She is oriented to person, place, and time. She appears well-developed and well-nourished.  HENT:  Head: Normocephalic and atraumatic.  Eyes: Conjunctivae and EOM are normal.  Neck: Normal range of motion.  Cardiovascular: Normal rate, regular rhythm, normal heart sounds and intact distal pulses.   Pulmonary/Chest: Effort normal and breath sounds normal.  Abdominal: Soft. Bowel sounds are normal.  Lymphadenopathy:    She has no cervical adenopathy.  Neurological: She is alert and oriented to person, place, and time.  Skin: Skin is warm and dry. Capillary refill takes less than 2 seconds.  Psychiatric: She has a normal mood and affect. Her behavior is normal.  Nursing note and vitals reviewed.    Musculoskeletal Exam: C-spine some stiffness with range of motion lumbar spine his spine and thoracic spine were good range of motion. Shoulder joints elbow joints wrist joints are good range of motion she has some prominence of right ulnar styloid. She has thickening of MCPs PIPs DIP joints with no synovitis. She subluxation of some of her DIP joints. Due to underlying osteoarthritis. Hip joints are good range of motion of her left total knee replacement is doing well without any warmth swelling or effusion right knee joint she is painful range of motion with some crepitus ankle joints MTPs PIPs did not reveal any synovitis.  CDAI Exam: CDAI Homunculus Exam:   Tenderness:  Right hand: 2nd PIP, 3rd PIP and 4th PIP Left hand: 2nd PIP RLE: tibiofemoral  Joint Counts:  CDAI Tender Joint count: 5 CDAI Swollen Joint count: 0  Global Assessments:  Patient Global Assessment: 5 Provider Global Assessment: 4  CDAI Calculated Score: 14    Investigation: Findings:    03/22/2016 CBC normal, CMP calcium 10.6 slightly elevated 12/01/2015 RF less than 10 ,CCP antibody 27    Imaging: No results found.  Speciality Comments: No specialty comments available.    Procedures:  No procedures performed Allergies: Antihistamines, chlorpheniramine-type   Assessment / Plan:     Visit Diagnoses: Rheumatoid arthritis of multiple sites with negative rheumatoid factor (HCC) - Negative RF, positive CCP she continues to have some discomfort in her hands but I do not see any synovitis on examination. She has some synovial thickening. She has some thickening of her PIP/DIP DIP joints some as well. And decreased fist formation.  High risk medication use - Plaquenil 200 mg a.m., 100 mg p.m. her labs have been normal so far we'll check her labs again today and then in 5 months to monitor for drug toxicity she's aware of getting her eye exams on yearly basis. Last eye exam 02/02/2016 was normal.  Primary osteoarthritis of right knee - End-stage osteoarthritis. She continues to have some discomfort and plans to have surgery in future.  History of total knee replacement, left - Dr. Mayer Camel. She is doing quite well regards to her  replacement.  Primary osteoarthritis of both hands: Joint protection and muscle strengthening was discussed.  Primary osteoarthritis of both feet: Proper fitting shoes were discussed.  Essential hypertension: Her blood pressure is controlled today  Dyslipidemia    Orders: Orders Placed This Encounter  Procedures  . COMPLETE METABOLIC PANEL WITH GFR  . CBC with Differential/Platelet  . CBC with Differential/Platelet  . COMPLETE METABOLIC PANEL WITH GFR   No orders of the defined types were placed in this encounter.   Face-to-face time spent with patient was 30 minutes. 50% of time was spent in counseling and coordination of care.  Follow-Up Instructions: Return in about 5 months (around 02/03/2017) for Rheumatoid arthritis.   Bo Merino, MD  Note - This record has been created using Editor, commissioning.  Chart creation errors have been sought, but may not always  have been located. Such creation errors do not reflect on  the standard of medical care.

## 2016-09-05 ENCOUNTER — Ambulatory Visit (INDEPENDENT_AMBULATORY_CARE_PROVIDER_SITE_OTHER): Payer: Commercial Managed Care - HMO | Admitting: Rheumatology

## 2016-09-05 ENCOUNTER — Encounter: Payer: Self-pay | Admitting: Rheumatology

## 2016-09-05 VITALS — BP 128/72 | HR 74 | Resp 14 | Ht 61.5 in | Wt 154.0 lb

## 2016-09-05 DIAGNOSIS — M19072 Primary osteoarthritis, left ankle and foot: Secondary | ICD-10-CM | POA: Diagnosis not present

## 2016-09-05 DIAGNOSIS — I10 Essential (primary) hypertension: Secondary | ICD-10-CM

## 2016-09-05 DIAGNOSIS — M0609 Rheumatoid arthritis without rheumatoid factor, multiple sites: Secondary | ICD-10-CM | POA: Diagnosis not present

## 2016-09-05 DIAGNOSIS — M19041 Primary osteoarthritis, right hand: Secondary | ICD-10-CM | POA: Diagnosis not present

## 2016-09-05 DIAGNOSIS — M19042 Primary osteoarthritis, left hand: Secondary | ICD-10-CM | POA: Diagnosis not present

## 2016-09-05 DIAGNOSIS — M1711 Unilateral primary osteoarthritis, right knee: Secondary | ICD-10-CM | POA: Diagnosis not present

## 2016-09-05 DIAGNOSIS — E785 Hyperlipidemia, unspecified: Secondary | ICD-10-CM

## 2016-09-05 DIAGNOSIS — M19071 Primary osteoarthritis, right ankle and foot: Secondary | ICD-10-CM | POA: Diagnosis not present

## 2016-09-05 DIAGNOSIS — Z79899 Other long term (current) drug therapy: Secondary | ICD-10-CM | POA: Diagnosis not present

## 2016-09-05 DIAGNOSIS — Z96652 Presence of left artificial knee joint: Secondary | ICD-10-CM

## 2016-09-05 LAB — CBC WITH DIFFERENTIAL/PLATELET
BASOS ABS: 56 {cells}/uL (ref 0–200)
Basophils Relative: 1 %
EOS ABS: 448 {cells}/uL (ref 15–500)
EOS PCT: 8 %
HEMATOCRIT: 28 % — AB (ref 35.0–45.0)
HEMOGLOBIN: 8.5 g/dL — AB (ref 11.7–15.5)
LYMPHS ABS: 1680 {cells}/uL (ref 850–3900)
Lymphocytes Relative: 30 %
MCH: 24.1 pg — AB (ref 27.0–33.0)
MCHC: 30.4 g/dL — AB (ref 32.0–36.0)
MCV: 79.3 fL — ABNORMAL LOW (ref 80.0–100.0)
MONO ABS: 616 {cells}/uL (ref 200–950)
MPV: 8.9 fL (ref 7.5–12.5)
Monocytes Relative: 11 %
NEUTROS ABS: 2800 {cells}/uL (ref 1500–7800)
Neutrophils Relative %: 50 %
Platelets: 389 10*3/uL (ref 140–400)
RBC: 3.53 MIL/uL — ABNORMAL LOW (ref 3.80–5.10)
RDW: 15.8 % — ABNORMAL HIGH (ref 11.0–15.0)
WBC: 5.6 10*3/uL (ref 3.8–10.8)

## 2016-09-05 LAB — COMPLETE METABOLIC PANEL WITH GFR
ALBUMIN: 3.9 g/dL (ref 3.6–5.1)
ALK PHOS: 69 U/L (ref 33–130)
ALT: 12 U/L (ref 6–29)
AST: 19 U/L (ref 10–35)
BUN: 17 mg/dL (ref 7–25)
CHLORIDE: 101 mmol/L (ref 98–110)
CO2: 27 mmol/L (ref 20–31)
Calcium: 10.1 mg/dL (ref 8.6–10.4)
Creat: 0.89 mg/dL (ref 0.60–0.93)
GFR, EST NON AFRICAN AMERICAN: 64 mL/min (ref >=60)
GFR, Est African American: 74 mL/min (ref >=60)
GLUCOSE: 91 mg/dL (ref 65–99)
Potassium: 4 mmol/L (ref 3.5–5.3)
SODIUM: 136 mmol/L (ref 135–146)
Total Bilirubin: 0.5 mg/dL (ref 0.2–1.2)
Total Protein: 6.7 g/dL (ref 6.1–8.1)

## 2016-09-05 NOTE — Progress Notes (Signed)
CMP normal, CBC hemoglobin 8.5 which is low and stable please fax it to PCP

## 2016-09-05 NOTE — Progress Notes (Signed)
Rheumatology Medication Review by a Pharmacist Does the patient feel that his/her medications are working for him/her?  Patient is unable to tell if the medication is working.  She reports some pain in her hands at night and occasional swelling in her right hand.   Has the patient been experiencing any side effects to the medications prescribed?  No Does the patient have any problems obtaining medications?  No  Issues to address at subsequent visits: None   Pharmacist comments:  Ann Gamble is a pleasant 76 yo F who presents for follow up of rheumatoid arthritis.  She is currently taking hydroxychloroquine 200 mg in the morning and 100 mg in the evening.  Patient had standing labs on 03/22/16.  CBC was normal, CMP was normal except for calcium of 10.6.  Patient is due for standing labs at this time.  She had most recent hydroxychloroquine eye exam on 02/02/16 which was normal.  She will be due for hydroxychloroquine eye exam again in June 2018.  Patient denies any questions or concerns regarding her medications at this time.    Elisabeth Most, Pharm.D., BCPS, CPP Clinical Pharmacist Pager: [phone] Phone: [phone] 09/05/2016 10:35 AM

## 2016-09-05 NOTE — Patient Instructions (Addendum)
   Standing Labs We placed an order today for your standing lab work.    Please come back and get your standing labs in May 2018 and every 4 months.    We have open lab Monday through Friday from 8:30-11:30 AM and 1:30-4 PM at the office of Dr. Tresa Moore, PA.   The office is located at [address] [place], [place], [place] [address] No appointment is necessary.   Labs are drawn by [facility].  You may receive a bill from [facility] for your lab work.

## 2016-09-07 DIAGNOSIS — H43821 Vitreomacular adhesion, right eye: Secondary | ICD-10-CM | POA: Diagnosis not present

## 2016-09-18 ENCOUNTER — Other Ambulatory Visit: Payer: Self-pay | Admitting: Rheumatology

## 2016-09-18 NOTE — Telephone Encounter (Signed)
Last Visit: 09/05/16 Next Visit: 01/30/17 Labs: 09/05/16 Hgb: 8.5 (stable) PLQ Eye Exam: 02/02/16 WNL  Okay to refill PLQ?

## 2016-10-06 DIAGNOSIS — K210 Gastro-esophageal reflux disease with esophagitis: Secondary | ICD-10-CM | POA: Diagnosis not present

## 2016-11-06 DIAGNOSIS — K219 Gastro-esophageal reflux disease without esophagitis: Secondary | ICD-10-CM | POA: Diagnosis not present

## 2016-11-06 DIAGNOSIS — K210 Gastro-esophageal reflux disease with esophagitis: Secondary | ICD-10-CM | POA: Diagnosis not present

## 2016-11-06 DIAGNOSIS — R1310 Dysphagia, unspecified: Secondary | ICD-10-CM | POA: Diagnosis not present

## 2016-11-06 DIAGNOSIS — Q393 Congenital stenosis and stricture of esophagus: Secondary | ICD-10-CM | POA: Diagnosis not present

## 2016-11-06 DIAGNOSIS — K222 Esophageal obstruction: Secondary | ICD-10-CM | POA: Diagnosis not present

## 2016-11-06 DIAGNOSIS — K449 Diaphragmatic hernia without obstruction or gangrene: Secondary | ICD-10-CM | POA: Diagnosis not present

## 2016-11-06 DIAGNOSIS — K2900 Acute gastritis without bleeding: Secondary | ICD-10-CM | POA: Diagnosis not present

## 2016-11-06 DIAGNOSIS — K2970 Gastritis, unspecified, without bleeding: Secondary | ICD-10-CM | POA: Diagnosis not present

## 2016-11-06 DIAGNOSIS — D508 Other iron deficiency anemias: Secondary | ICD-10-CM | POA: Diagnosis not present

## 2016-11-06 DIAGNOSIS — R1013 Epigastric pain: Secondary | ICD-10-CM | POA: Diagnosis not present

## 2016-11-06 HISTORY — PX: ESOPHAGOGASTRODUODENOSCOPY: SHX1529

## 2016-11-07 DIAGNOSIS — H2512 Age-related nuclear cataract, left eye: Secondary | ICD-10-CM | POA: Diagnosis not present

## 2016-11-07 DIAGNOSIS — E785 Hyperlipidemia, unspecified: Secondary | ICD-10-CM | POA: Diagnosis not present

## 2016-11-07 DIAGNOSIS — Z79899 Other long term (current) drug therapy: Secondary | ICD-10-CM | POA: Diagnosis not present

## 2016-11-07 DIAGNOSIS — Z7982 Long term (current) use of aspirin: Secondary | ICD-10-CM | POA: Diagnosis not present

## 2016-11-07 DIAGNOSIS — R7303 Prediabetes: Secondary | ICD-10-CM | POA: Diagnosis not present

## 2016-11-07 DIAGNOSIS — K219 Gastro-esophageal reflux disease without esophagitis: Secondary | ICD-10-CM | POA: Diagnosis not present

## 2016-11-07 DIAGNOSIS — Z87891 Personal history of nicotine dependence: Secondary | ICD-10-CM | POA: Diagnosis not present

## 2016-11-07 DIAGNOSIS — I10 Essential (primary) hypertension: Secondary | ICD-10-CM | POA: Diagnosis not present

## 2016-11-07 DIAGNOSIS — H25812 Combined forms of age-related cataract, left eye: Secondary | ICD-10-CM | POA: Diagnosis not present

## 2016-11-09 DIAGNOSIS — H43821 Vitreomacular adhesion, right eye: Secondary | ICD-10-CM | POA: Diagnosis not present

## 2016-11-15 DIAGNOSIS — Z6829 Body mass index (BMI) 29.0-29.9, adult: Secondary | ICD-10-CM | POA: Diagnosis not present

## 2016-11-15 DIAGNOSIS — E784 Other hyperlipidemia: Secondary | ICD-10-CM | POA: Diagnosis not present

## 2016-11-15 DIAGNOSIS — R7303 Prediabetes: Secondary | ICD-10-CM | POA: Diagnosis not present

## 2016-11-15 DIAGNOSIS — I10 Essential (primary) hypertension: Secondary | ICD-10-CM | POA: Diagnosis not present

## 2016-11-15 DIAGNOSIS — Z0000 Encounter for general adult medical examination without abnormal findings: Secondary | ICD-10-CM | POA: Diagnosis not present

## 2016-11-20 DIAGNOSIS — K219 Gastro-esophageal reflux disease without esophagitis: Secondary | ICD-10-CM | POA: Diagnosis not present

## 2016-11-20 DIAGNOSIS — I10 Essential (primary) hypertension: Secondary | ICD-10-CM | POA: Diagnosis not present

## 2016-11-20 DIAGNOSIS — E7800 Pure hypercholesterolemia, unspecified: Secondary | ICD-10-CM | POA: Diagnosis not present

## 2016-11-20 DIAGNOSIS — Q438 Other specified congenital malformations of intestine: Secondary | ICD-10-CM | POA: Diagnosis not present

## 2016-11-20 DIAGNOSIS — K5730 Diverticulosis of large intestine without perforation or abscess without bleeding: Secondary | ICD-10-CM | POA: Diagnosis not present

## 2016-11-20 DIAGNOSIS — K648 Other hemorrhoids: Secondary | ICD-10-CM | POA: Diagnosis not present

## 2016-11-20 DIAGNOSIS — R7303 Prediabetes: Secondary | ICD-10-CM | POA: Diagnosis not present

## 2016-11-20 DIAGNOSIS — D500 Iron deficiency anemia secondary to blood loss (chronic): Secondary | ICD-10-CM | POA: Diagnosis not present

## 2016-11-20 DIAGNOSIS — D509 Iron deficiency anemia, unspecified: Secondary | ICD-10-CM | POA: Diagnosis not present

## 2016-11-20 DIAGNOSIS — M1990 Unspecified osteoarthritis, unspecified site: Secondary | ICD-10-CM | POA: Diagnosis not present

## 2016-11-20 HISTORY — PX: COLONOSCOPY: SHX174

## 2016-11-28 DIAGNOSIS — I700 Atherosclerosis of aorta: Secondary | ICD-10-CM | POA: Diagnosis not present

## 2016-11-28 DIAGNOSIS — K869 Disease of pancreas, unspecified: Secondary | ICD-10-CM | POA: Diagnosis not present

## 2016-11-28 DIAGNOSIS — D649 Anemia, unspecified: Secondary | ICD-10-CM | POA: Diagnosis not present

## 2016-11-28 DIAGNOSIS — R634 Abnormal weight loss: Secondary | ICD-10-CM | POA: Diagnosis not present

## 2016-12-11 DIAGNOSIS — Z0100 Encounter for examination of eyes and vision without abnormal findings: Secondary | ICD-10-CM | POA: Diagnosis not present

## 2016-12-12 ENCOUNTER — Other Ambulatory Visit: Payer: Self-pay | Admitting: Radiology

## 2016-12-12 DIAGNOSIS — Z79899 Other long term (current) drug therapy: Secondary | ICD-10-CM

## 2016-12-12 LAB — CBC WITH DIFFERENTIAL/PLATELET
Basophils Absolute: 48 cells/uL (ref 0–200)
Basophils Relative: 1 %
EOS ABS: 288 {cells}/uL (ref 15–500)
Eosinophils Relative: 6 %
HEMATOCRIT: 39.1 % (ref 35.0–45.0)
Hemoglobin: 11.9 g/dL (ref 11.7–15.5)
Lymphocytes Relative: 44 %
Lymphs Abs: 2112 cells/uL (ref 850–3900)
MCH: 26.5 pg — ABNORMAL LOW (ref 27.0–33.0)
MCHC: 30.4 g/dL — AB (ref 32.0–36.0)
MCV: 87.1 fL (ref 80.0–100.0)
MONO ABS: 480 {cells}/uL (ref 200–950)
MPV: 8.8 fL (ref 7.5–12.5)
Monocytes Relative: 10 %
NEUTROS PCT: 39 %
Neutro Abs: 1872 cells/uL (ref 1500–7800)
Platelets: 264 10*3/uL (ref 140–400)
RBC: 4.49 MIL/uL (ref 3.80–5.10)
RDW: 24.4 % — ABNORMAL HIGH (ref 11.0–15.0)
WBC: 4.8 10*3/uL (ref 3.8–10.8)

## 2016-12-13 LAB — COMPLETE METABOLIC PANEL WITH GFR
ALBUMIN: 4 g/dL (ref 3.6–5.1)
ALK PHOS: 63 U/L (ref 33–130)
ALT: 12 U/L (ref 6–29)
AST: 19 U/L (ref 10–35)
BUN: 15 mg/dL (ref 7–25)
CALCIUM: 10 mg/dL (ref 8.6–10.4)
CO2: 26 mmol/L (ref 20–31)
CREATININE: 0.91 mg/dL (ref 0.60–0.93)
Chloride: 102 mmol/L (ref 98–110)
GFR, Est African American: 72 mL/min (ref >=60)
GFR, Est Non African American: 62 mL/min (ref >=60)
Glucose, Bld: 81 mg/dL (ref 65–99)
POTASSIUM: 3.8 mmol/L (ref 3.5–5.3)
Sodium: 140 mmol/L (ref 135–146)
Total Bilirubin: 0.3 mg/dL (ref 0.2–1.2)
Total Protein: 6.6 g/dL (ref 6.1–8.1)

## 2016-12-18 DIAGNOSIS — R634 Abnormal weight loss: Secondary | ICD-10-CM | POA: Diagnosis not present

## 2017-01-17 DIAGNOSIS — Z79899 Other long term (current) drug therapy: Secondary | ICD-10-CM | POA: Diagnosis not present

## 2017-01-23 DIAGNOSIS — D500 Iron deficiency anemia secondary to blood loss (chronic): Secondary | ICD-10-CM | POA: Diagnosis not present

## 2017-01-23 DIAGNOSIS — D490 Neoplasm of unspecified behavior of digestive system: Secondary | ICD-10-CM | POA: Diagnosis not present

## 2017-01-26 NOTE — Progress Notes (Signed)
Office Visit Note  Patient: Ann Gamble             Date of Birth: 10-05-1940           MRN: [id number]             PCP: System, Pcp Not In Referring: No ref. provider found Visit Date: 01/30/2017 Occupation: @GUAROCC @    Subjective:  Hand pain.   History of Present Illness: Ann Gamble is a 76 y.o. female with history of sero positive rheumatoid arthritis. She states she continues to have some discomfort in her hands. She also has noticed decreased grip strength in her hands. She's been having increased discomfort in her bilateral knee joints. 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 Which is also causing some discomfort. She continues to have pain and discomfort in her bilateral feet as well.  Activities of Daily Living:  Patient reports morning stiffness for 10 minutes.   Patient Denies nocturnal pain.  Difficulty dressing/grooming: Denies Difficulty climbing stairs: Reports Difficulty getting out of chair: Reports Difficulty using hands for taps, buttons, cutlery, and/or writing: Reports   Review of Systems  Constitutional: Negative for fatigue, night sweats, weight gain, weight loss and weakness.  HENT: Positive for mouth dryness. Negative for mouth sores, trouble swallowing, trouble swallowing and nose dryness.   Eyes: Positive for dryness. Negative for pain, redness and visual disturbance.  Respiratory: Negative for cough, shortness of breath and difficulty breathing.   Cardiovascular: Positive for hypertension. Negative for chest pain, palpitations, irregular heartbeat and swelling in legs/feet.  Gastrointestinal: Negative for blood in stool, constipation and diarrhea.  Endocrine: Negative for increased urination.  Genitourinary: Negative for vaginal dryness.  Musculoskeletal: Positive for arthralgias, joint pain and morning stiffness. Negative for joint swelling, myalgias, muscle weakness, muscle tenderness and myalgias.  Skin: Negative for color change, rash, hair  loss, skin tightness, ulcers and sensitivity to sunlight.  Allergic/Immunologic: Negative for susceptible to infections.  Neurological: Negative for dizziness, memory loss and night sweats.  Hematological: Negative for swollen glands.  Psychiatric/Behavioral: Negative for depressed mood and sleep disturbance. The patient is not nervous/anxious.     PMFS History:  Patient Active Problem List   Diagnosis Date Noted  . Rheumatoid arthritis of multiple sites with negative rheumatoid factor ([place]) 09/04/2016  . High risk medication use 09/04/2016  . Primary osteoarthritis of right knee 09/04/2016  . History of total knee replacement, left 09/04/2016  . Primary osteoarthritis of both hands 09/04/2016  . Primary osteoarthritis of both feet 09/04/2016  . Essential hypertension 09/04/2016  . Dyslipidemia 09/04/2016    Past Medical History:  Diagnosis Date  . Arthritis   . Cataract immature    right  . Constipation    takes stool softener nightly  . Hx of colonic polyps   . Hyperlipidemia    takes Simvastatin daily  . Hypertension    takes Hyzaar and Amlodipine daily  . Seasonal allergies   . Sensitive skin    pt states she has highly sensitive skin  . Urinary leakage     Family History  Problem Relation Age of Onset  . Anesthesia problems Neg Hx   . Hypotension Neg Hx   . Pseudochol deficiency Neg Hx   . Malignant hyperthermia Neg Hx    Past Surgical History:  Procedure Laterality Date  . CATARACT EXTRACTION Bilateral   . COLONOSCOPY    . TONSILLECTOMY  1960  . TOTAL KNEE ARTHROPLASTY  10/30/2011   Procedure: TOTAL KNEE ARTHROPLASTY;  Surgeon: Kerin Salen, MD;  Location: [facility];  Service: Orthopedics;  Laterality: Left;  DEPUY/SIGMA  . VAGINAL HYSTERECTOMY  1970's   partial   Social History   Social History Narrative  . No narrative on file     Objective: Vital Signs: BP 120/62   Pulse 78   Resp 14   Ht 5\' 1"  (1.549 m)   Wt 154 lb (69.9 kg)   BMI 29.10 kg/m      Physical Exam  Constitutional: She is oriented to person, place, and time. She appears well-developed and well-nourished.  HENT:  Head: Normocephalic and atraumatic.  Eyes: Conjunctivae and EOM are normal.  Neck: Normal range of motion.  Cardiovascular: Normal rate, regular rhythm, normal heart sounds and intact distal pulses.   Pulmonary/Chest: Effort normal and breath sounds normal.  Abdominal: Soft. Bowel sounds are normal.  Lymphadenopathy:    She has no cervical adenopathy.  Neurological: She is alert and oriented to person, place, and time.  Skin: Skin is warm and dry. Capillary refill takes less than 2 seconds.  Psychiatric: She has a normal mood and affect. Her behavior is normal.  Nursing note and vitals reviewed.    Musculoskeletal Exam: C-spine and thoracic lumbar spine good range of motion. Shoulder joints elbow joints are good range of motion. She has limitation of range of motion of her bilateral wrist joints. Some synovial thickening over bilateral wrist joints was noted. She has ulnar deviation and synovial thickening of all MCP joints. She has PIP/DIP thickening of her hands consistent with osteoarthritis. Hip joints were good range of motion. Her left total knee replacement is doing well she has some discomfort range of motion of her right knee joint without any warmth swelling or effusion. She has some MTP and PIP joint changes in her feet consistent with rheumatoid arthritis and osteoarthritis overlap.  CDAI Exam: CDAI Homunculus Exam:   Joint Counts:  CDAI Tender Joint count: 0 CDAI Swollen Joint count: 0  Global Assessments:  Patient Global Assessment: 5 Provider Global Assessment: 5  CDAI Calculated Score: 10    Investigation: Findings:  11/25/2015 X-ray of bilateral hands, 2 views, show PIP/DIP severe narrowing and subluxation without any erosive changes and bilateral CMC narrowing.  She has some changes in the right ulnar styloid area.  I wonder if it  is related to her prior injury.  Bilateral feet x-rays showed PIP/DIP narrowing and right calcaneal spur without any MTP changes or erosive changes consistent with osteoarthritis.    April 2017:  Uric acid was 6.3, rheumatoid factor was less then 10, and CCP was 27.    05/17/2016 After informed consent was obtained, ultrasound examination of bilateral hands was performed using 12 megahertz transducer, gray scale and power Doppler.  Bilateral 2nd, 3rd and 5th MCP joints and bilateral wrist joints, both dorsal and volar aspects, were evaluated.  The findings were she had joint space narrowing in bilateral 2nd, 3rd and 5th MCP joints; synovial thickening in the right 2nd and 5th MCP joints, and no synovitis was noted in wrist joint.  There was some synovial thickening in the right wrist joint, ulnar aspect, and possible erosion, which is not new.  The right median nerve was 0.09 cm2 and left median nerve was 0.14 cm2, which was more than upper limits of normal.    IMPRESSION AND PLAN:  These findings are consistent with rheumatoid arthritis.  She has synovial thickening and joint space narrowing, but no active synovitis.  The left  median nerve was enlarged, although she does not have any symptoms of carpal tunnel syndrome.  At this point, we will not change her treatment.     CBC Latest Ref Rng & Units 12/12/2016 09/05/2016 11/02/2011  WBC 3.8 - 10.8 K/uL 4.8 5.6 10.5  Hemoglobin 11.7 - 15.5 g/dL 11.9 8.5(L) 8.9(L)  Hematocrit 35.0 - 45.0 % 39.1 28.0(L) 26.7(L)  Platelets 140 - 400 K/uL 264 389 202    CMP Latest Ref Rng & Units 12/12/2016 09/05/2016 10/31/2011  Glucose 65 - 99 mg/dL 81 91 111(H)  BUN 7 - 25 mg/dL 15 17 12   Creatinine 0.60 - 0.93 mg/dL 0.91 0.89 0.76  Sodium 135 - 146 mmol/L 140 136 137  Potassium 3.5 - 5.3 mmol/L 3.8 4.0 3.0(L)  Chloride 98 - 110 mmol/L 102 101 99  CO2 20 - 31 mmol/L 26 27 28   Calcium 8.6 - 10.4 mg/dL 10.0 10.1 9.0  Total Protein 6.1 - 8.1 g/dL 6.6 6.7 -  Total  Bilirubin 0.2 - 1.2 mg/dL 0.3 0.5 -  Alkaline Phos 33 - 130 U/L 63 69 -  AST 10 - 35 U/L 19 19 -  ALT 6 - 29 U/L 12 12 -    Imaging: No results found.  Speciality Comments: No specialty comments available.    Procedures:  No procedures performed Allergies: Antihistamines, chlorpheniramine-type   Assessment / Plan:     Visit Diagnoses: Rheumatoid arthritis of multiple sites with negative rheumatoid factor ([facility]) - -RF +CCP. Patient has no synovitis on examination I'll do she continues to have some discomfort in her hands. Ultrasound examination of hands in October 2017 did not show any synovitis there was only synovial thickening.  High risk medication use - PLQ 200 mg by mouth every morning and 100 mg by mouth every afternoon. She has been tolerating her medications well. Her labs has been stable. According to patient her eye exams have been normal.  Primary osteoarthritis of both hands: She is significant osteoarthritis in her hands which causes discomfort. Joint protection and muscle strengthening was discussed. A handout on hand exercises was given.  Primary osteoarthritis of right knee: She's been advised to schedule an appointment with Dr. Mayer Camel  History of total knee replacement, left - by Dr. Mayer Camel: She's been having increased discomfort in her left replaced knee as well  Primary osteoarthritis of both feet: Proper fitting shoes were discussed.  History of hypertension: Her blood pressure was controlled today.  History of hyperlipidemia    Orders: No orders of the defined types were placed in this encounter.  No orders of the defined types were placed in this encounter.   Face-to-face time spent with patient was 30 minutes. 50% of time was spent in counseling and coordination of care.  Follow-Up Instructions: Return in about 5 months (around 07/02/2017) for Rheumatoid arthritis, Osteoarthritis.   Bo Merino, MD  Note - This record has been created using  Editor, commissioning.  Chart creation errors have been sought, but may not always  have been located. Such creation errors do not reflect on  the standard of medical care.

## 2017-01-30 ENCOUNTER — Ambulatory Visit (INDEPENDENT_AMBULATORY_CARE_PROVIDER_SITE_OTHER): Payer: Commercial Managed Care - HMO | Admitting: Rheumatology

## 2017-01-30 ENCOUNTER — Encounter: Payer: Self-pay | Admitting: Rheumatology

## 2017-01-30 VITALS — BP 120/62 | HR 78 | Resp 14 | Ht 61.0 in | Wt 154.0 lb

## 2017-01-30 DIAGNOSIS — Z8639 Personal history of other endocrine, nutritional and metabolic disease: Secondary | ICD-10-CM | POA: Diagnosis not present

## 2017-01-30 DIAGNOSIS — M1711 Unilateral primary osteoarthritis, right knee: Secondary | ICD-10-CM

## 2017-01-30 DIAGNOSIS — M19071 Primary osteoarthritis, right ankle and foot: Secondary | ICD-10-CM

## 2017-01-30 DIAGNOSIS — Z79899 Other long term (current) drug therapy: Secondary | ICD-10-CM

## 2017-01-30 DIAGNOSIS — Z96652 Presence of left artificial knee joint: Secondary | ICD-10-CM

## 2017-01-30 DIAGNOSIS — M19072 Primary osteoarthritis, left ankle and foot: Secondary | ICD-10-CM

## 2017-01-30 DIAGNOSIS — M0609 Rheumatoid arthritis without rheumatoid factor, multiple sites: Secondary | ICD-10-CM | POA: Diagnosis not present

## 2017-01-30 DIAGNOSIS — M19041 Primary osteoarthritis, right hand: Secondary | ICD-10-CM

## 2017-01-30 DIAGNOSIS — M19042 Primary osteoarthritis, left hand: Secondary | ICD-10-CM | POA: Diagnosis not present

## 2017-01-30 DIAGNOSIS — Z8679 Personal history of other diseases of the circulatory system: Secondary | ICD-10-CM | POA: Diagnosis not present

## 2017-01-30 NOTE — Patient Instructions (Signed)
Standing Labs We placed an order today for your standing lab work.    Please come back and get your standing labs in October and every 5 months Hand Exercises Hand exercises can be helpful to almost anyone. These exercises can strengthen the hands, improve flexibility and movement, and increase blood flow to the hands. These results can make work and daily tasks easier. Hand exercises can be especially helpful for people who have joint pain from arthritis or have nerve damage from overuse (carpal tunnel syndrome). These exercises can also help people who have injured a hand. Most of these hand exercises are fairly gentle stretching routines. You can do them often throughout the day. Still, it is a good idea to ask your health care provider which exercises would be best for you. Warming your hands before exercise may help to reduce stiffness. You can do this with gentle massage or by placing your hands in warm water for 15 minutes. Also, make sure you pay attention to your level of hand pain as you begin an exercise routine. Exercises Knuckle Bend Repeat this exercise 5-10 times with each hand. 1. Stand or sit with your arm, hand, and all five fingers pointed straight up. Make sure your wrist is straight. 2. Gently and slowly bend your fingers down and inward until the tips of your fingers are touching the tops of your palm. 3. Hold this position for a few seconds. 4. Extend your fingers out to their original position, all pointing straight up again.  Finger Fan Repeat this exercise 5-10 times with each hand. 1. Hold your arm and hand out in front of you. Keep your wrist straight. 2. Squeeze your hand into a fist. 3. Hold this position for a few seconds. 4. Edison Simon out, or spread apart, your hand and fingers as much as possible, stretching every joint fully.  Tabletop Repeat this exercise 5-10 times with each hand. 1. Stand or sit with your arm, hand, and all five fingers pointed straight up. Make  sure your wrist is straight. 2. Gently and slowly bend your fingers at the knuckles where they meet the hand until your hand is making an upside-down L shape. Your fingers should form a tabletop. 3. Hold this position for a few seconds. 4. Extend your fingers out to their original position, all pointing straight up again.  Making Os Repeat this exercise 5-10 times with each hand. 1. Stand or sit with your arm, hand, and all five fingers pointed straight up. Make sure your wrist is straight. 2. Make an O shape by touching your pointer finger to your thumb. Hold for a few seconds. Then open your hand wide. 3. Repeat this motion with each finger on your hand.  Table Spread Repeat this exercise 5-10 times with each hand. 1. Place your hand on a table with your palm facing down. Make sure your wrist is straight. 2. Spread your fingers out as much as possible. Hold this position for a few seconds. 3. Slide your fingers back together again. Hold for a few seconds.  Ball Grip  Repeat this exercise 10-15 times with each hand. 1. Hold a tennis ball or another soft ball in your hand. 2. While slowly increasing pressure, squeeze the ball as hard as possible. 3. Squeeze as hard as you can for 3-5 seconds. 4. Relax and repeat.  Wrist Curls Repeat this exercise 10-15 times with each hand. 1. Sit in a chair that has armrests. 2. Hold a light weight in your  hand, such as a dumbbell that weighs 1-3 pounds (0.5-1.4 kg). Ask your health care provider what weight would be best for you. 3. Rest your hand just over the end of the chair arm with your palm facing up. 4. Gently pivot your wrist up and down while holding the weight. Do not twist your wrist from side to side.  Contact a health care provider if:  Your hand pain or discomfort gets much worse when you do an exercise.  Your hand pain or discomfort does not improve within 2 hours after you exercise. If you have any of these problems, stop doing  these exercises right away. Do not do them again unless your health care provider says that you can. Get help right away if:  You develop sudden, severe hand pain. If this happens, stop doing these exercises right away. Do not do them again unless your health care provider says that you can. This information is not intended to replace advice given to you by your health care provider. Make sure you discuss any questions you have with your health care provider. Document Released: 07/12/2015 Document Revised: 01/06/2016 Document Reviewed: 02/08/2015 Elsevier Interactive Patient Education  [employer].   We have open lab Monday through Friday from 8:30-11:30 AM and 1:30-4 PM at the office of Dr. Tresa Moore, PA.   The office is located at [address], [place], [place], [place] [address] No appointment is necessary.   Labs are drawn by [facility].  You may receive a bill from [facility] for your lab work. If you have any questions regarding directions or hours of operation,  please call [phone].

## 2017-02-15 DIAGNOSIS — D649 Anemia, unspecified: Secondary | ICD-10-CM | POA: Diagnosis not present

## 2017-03-12 DIAGNOSIS — H43823 Vitreomacular adhesion, bilateral: Secondary | ICD-10-CM | POA: Diagnosis not present

## 2017-03-20 ENCOUNTER — Other Ambulatory Visit: Payer: Self-pay | Admitting: Rheumatology

## 2017-03-20 NOTE — Telephone Encounter (Signed)
Last Visit: 01/30/17 Next Visit: 07/24/17 Labs: 12/12/16 WNL  PLQ Eye exam 03/12/17 WNL. Patient will have them fax the results.   Okay to refill per Dr. Estanislado Pandy

## 2017-04-18 DIAGNOSIS — E784 Other hyperlipidemia: Secondary | ICD-10-CM | POA: Diagnosis not present

## 2017-04-18 DIAGNOSIS — R7303 Prediabetes: Secondary | ICD-10-CM | POA: Diagnosis not present

## 2017-04-18 DIAGNOSIS — I10 Essential (primary) hypertension: Secondary | ICD-10-CM | POA: Diagnosis not present

## 2017-04-30 DIAGNOSIS — I10 Essential (primary) hypertension: Secondary | ICD-10-CM | POA: Diagnosis not present

## 2017-04-30 DIAGNOSIS — Z7189 Other specified counseling: Secondary | ICD-10-CM | POA: Diagnosis not present

## 2017-04-30 DIAGNOSIS — Z23 Encounter for immunization: Secondary | ICD-10-CM | POA: Diagnosis not present

## 2017-04-30 DIAGNOSIS — R7303 Prediabetes: Secondary | ICD-10-CM | POA: Diagnosis not present

## 2017-04-30 DIAGNOSIS — Z0000 Encounter for general adult medical examination without abnormal findings: Secondary | ICD-10-CM | POA: Diagnosis not present

## 2017-04-30 DIAGNOSIS — Z139 Encounter for screening, unspecified: Secondary | ICD-10-CM | POA: Diagnosis not present

## 2017-04-30 DIAGNOSIS — E784 Other hyperlipidemia: Secondary | ICD-10-CM | POA: Diagnosis not present

## 2017-05-22 ENCOUNTER — Other Ambulatory Visit: Payer: Self-pay

## 2017-05-22 DIAGNOSIS — Z79899 Other long term (current) drug therapy: Secondary | ICD-10-CM | POA: Diagnosis not present

## 2017-05-22 LAB — COMPLETE METABOLIC PANEL WITH GFR
AG Ratio: 1.6 (calc) (ref 1.0–2.5)
ALBUMIN MSPROF: 4.1 g/dL (ref 3.6–5.1)
ALKALINE PHOSPHATASE (APISO): 65 U/L (ref 33–130)
ALT: 12 U/L (ref 6–29)
AST: 17 U/L (ref 10–35)
BUN: 15 mg/dL (ref 7–25)
CHLORIDE: 102 mmol/L (ref 98–110)
CO2: 30 mmol/L (ref 20–32)
Calcium: 10 mg/dL (ref 8.6–10.4)
Creat: 0.91 mg/dL (ref 0.60–0.93)
GFR, Est African American: 72 mL/min/{1.73_m2} (ref >=60)
GFR, Est Non African American: 62 mL/min/{1.73_m2} (ref >=60)
Globulin: 2.5 g/dL (calc) (ref 1.9–3.7)
Glucose, Bld: 108 mg/dL — ABNORMAL HIGH (ref 65–99)
Potassium: 4.1 mmol/L (ref 3.5–5.3)
Sodium: 139 mmol/L (ref 135–146)
Total Bilirubin: 0.4 mg/dL (ref 0.2–1.2)
Total Protein: 6.6 g/dL (ref 6.1–8.1)

## 2017-05-22 LAB — CBC WITH DIFFERENTIAL/PLATELET
BASOS ABS: 40 {cells}/uL (ref 0–200)
Basophils Relative: 1 %
EOS ABS: 336 {cells}/uL (ref 15–500)
Eosinophils Relative: 8.4 %
HEMATOCRIT: 40 % (ref 35.0–45.0)
HEMOGLOBIN: 13.2 g/dL (ref 11.7–15.5)
LYMPHS ABS: 1444 {cells}/uL (ref 850–3900)
MCH: 31 pg (ref 27.0–33.0)
MCHC: 33 g/dL (ref 32.0–36.0)
MCV: 93.9 fL (ref 80.0–100.0)
MONOS PCT: 11.6 %
MPV: 9.9 fL (ref 7.5–12.5)
NEUTROS ABS: 1716 {cells}/uL (ref 1500–7800)
Neutrophils Relative %: 42.9 %
Platelets: 237 10*3/uL (ref 140–400)
RBC: 4.26 10*6/uL (ref 3.80–5.10)
RDW: 13.4 % (ref 11.0–15.0)
Total Lymphocyte: 36.1 %
WBC mixed population: 464 cells/uL (ref 200–950)
WBC: 4 10*3/uL (ref 3.8–10.8)

## 2017-05-22 NOTE — Progress Notes (Signed)
wnl

## 2017-05-23 ENCOUNTER — Telehealth: Payer: Self-pay | Admitting: Radiology

## 2017-05-23 NOTE — Telephone Encounter (Signed)
-----   Message from Bo Merino, MD sent at 05/22/2017 10:39 PM EDT ----- wnl

## 2017-05-23 NOTE — Telephone Encounter (Signed)
I have called patient to advise labs are normal  

## 2017-06-19 ENCOUNTER — Other Ambulatory Visit: Payer: Self-pay | Admitting: Rheumatology

## 2017-06-19 NOTE — Telephone Encounter (Signed)
Last visit: 01/30/2017 Next visit: 07/24/2017 Labs: 05/22/2017 WNL  Eye exam: 03/12/2017  Okay to refill per Dr. Estanislado Pandy.

## 2017-07-03 ENCOUNTER — Ambulatory Visit: Payer: Commercial Managed Care - HMO | Admitting: Rheumatology

## 2017-07-12 NOTE — Progress Notes (Deleted)
Office Visit Note  Patient: Ann Gamble             Date of Birth: 02-09-1941           MRN: [id number]             PCP: System, Pcp Not In Referring: No ref. provider found Visit Date: 07/24/2017 Occupation: @GUAROCC @    Subjective:  No chief complaint on file.   History of Present Illness: Ann Gamble is a 76 y.o. female ***   Activities of Daily Living:  Patient reports morning stiffness for *** {minute/hour:19697}.   Patient {ACTIONS;DENIES/REPORTS:21021675::"Denies"} nocturnal pain.  Difficulty dressing/grooming: {ACTIONS;DENIES/REPORTS:21021675::"Denies"} Difficulty climbing stairs: {ACTIONS;DENIES/REPORTS:21021675::"Denies"} Difficulty getting out of chair: {ACTIONS;DENIES/REPORTS:21021675::"Denies"} Difficulty using hands for taps, buttons, cutlery, and/or writing: {ACTIONS;DENIES/REPORTS:21021675::"Denies"}   No Rheumatology ROS completed.   PMFS History:  Patient Active Problem List   Diagnosis Date Noted  . Rheumatoid arthritis of multiple sites with negative rheumatoid factor ([place]) 09/04/2016  . High risk medication use 09/04/2016  . Primary osteoarthritis of right knee 09/04/2016  . History of total knee replacement, left 09/04/2016  . Primary osteoarthritis of both hands 09/04/2016  . Primary osteoarthritis of both feet 09/04/2016  . Essential hypertension 09/04/2016  . Dyslipidemia 09/04/2016    Past Medical History:  Diagnosis Date  . Arthritis   . Cataract immature    right  . Constipation    takes stool softener nightly  . Hx of colonic polyps   . Hyperlipidemia    takes Simvastatin daily  . Hypertension    takes Hyzaar and Amlodipine daily  . Seasonal allergies   . Sensitive skin    pt states she has highly sensitive skin  . Urinary leakage     Family History  Problem Relation Age of Onset  . Anesthesia problems Neg Hx   . Hypotension Neg Hx   . Pseudochol deficiency Neg Hx   . Malignant hyperthermia Neg Hx    Past  Surgical History:  Procedure Laterality Date  . CATARACT EXTRACTION Bilateral   . COLONOSCOPY    . TONSILLECTOMY  1960  . TOTAL KNEE ARTHROPLASTY  10/30/2011   Procedure: TOTAL KNEE ARTHROPLASTY;  Surgeon: Kerin Salen, MD;  Location: [facility];  Service: Orthopedics;  Laterality: Left;  DEPUY/SIGMA  . VAGINAL HYSTERECTOMY  1970's   partial   Social History   Social History Narrative  . Not on file     Objective: Vital Signs: There were no vitals taken for this visit.   Physical Exam   Musculoskeletal Exam: ***  CDAI Exam: No CDAI exam completed.    Investigation: No additional findings.PLQ eye exam: 03/12/2017 CBC Latest Ref Rng & Units 05/22/2017 12/12/2016 09/05/2016  WBC 3.8 - 10.8 Thousand/uL 4.0 4.8 5.6  Hemoglobin 11.7 - 15.5 g/dL 13.2 11.9 8.5(L)  Hematocrit 35.0 - 45.0 % 40.0 39.1 28.0(L)  Platelets 140 - 400 Thousand/uL 237 264 389   CMP Latest Ref Rng & Units 05/22/2017 12/12/2016 09/05/2016  Glucose 65 - 99 mg/dL 108(H) 81 91  BUN 7 - 25 mg/dL 15 15 17   Creatinine 0.60 - 0.93 mg/dL 0.91 0.91 0.89  Sodium 135 - 146 mmol/L 139 140 136  Potassium 3.5 - 5.3 mmol/L 4.1 3.8 4.0  Chloride 98 - 110 mmol/L 102 102 101  CO2 20 - 32 mmol/L 30 26 27   Calcium 8.6 - 10.4 mg/dL 10.0 10.0 10.1  Total Protein 6.1 - 8.1 g/dL 6.6 6.6 6.7  Total Bilirubin 0.2 - 1.2  mg/dL 0.4 0.3 0.5  Alkaline Phos 33 - 130 U/L - 63 69  AST 10 - 35 U/L 17 19 19   ALT 6 - 29 U/L 12 12 12     Imaging: No results found.  Speciality Comments: No specialty comments available.    Procedures:  No procedures performed Allergies: Antihistamines, chlorpheniramine-type   Assessment / Plan:     Visit Diagnoses: No diagnosis found.    Orders: No orders of the defined types were placed in this encounter.  No orders of the defined types were placed in this encounter.   Face-to-face time spent with patient was *** minutes. 50% of time was spent in counseling and coordination of care.  Follow-Up  Instructions: No Follow-up on file.   Earnestine Mealing, CMA  Note - This record has been created using Editor, commissioning.  Chart creation errors have been sought, but may not always  have been located. Such creation errors do not reflect on  the standard of medical care.

## 2017-07-24 ENCOUNTER — Ambulatory Visit: Payer: Commercial Managed Care - HMO | Admitting: Rheumatology

## 2017-07-27 DIAGNOSIS — D500 Iron deficiency anemia secondary to blood loss (chronic): Secondary | ICD-10-CM | POA: Diagnosis not present

## 2017-07-30 NOTE — Progress Notes (Signed)
Office Visit Note  Patient: Ann Gamble             Date of Birth: [birth year]           MRN: [id number]             PCP: Maryella Shivers, MD Referring: No ref. provider found Visit Date: 07/31/2017 Occupation: @GUAROCC @    Subjective:  Hand pain    History of Present Illness: Ann Gamble is a 76 y.o. female with history of seronegative rheumatoid arthritis and osteoarthritis.  Patient states she continues to have significant bilateral hand pain and swelling.  She states she has not noticed any improvement with plaquenil 200 mg BID M-F .She states she has decreased grip strength.    Activities of Daily Living:  Patient reports morning stiffness for 15 minutes.   Patient Denies nocturnal pain.  Difficulty dressing/grooming: Denies Difficulty climbing stairs: Denies Difficulty getting out of chair: Denies Difficulty using hands for taps, buttons, cutlery, and/or writing: Reports   Review of Systems  Constitutional: Positive for fatigue. Negative for weakness.  HENT: Positive for mouth dryness. Negative for mouth sores and nose dryness.   Eyes: Negative for redness and dryness.  Respiratory: Negative for cough, hemoptysis, shortness of breath and difficulty breathing.   Cardiovascular: Negative for chest pain, palpitations, hypertension, irregular heartbeat and swelling in legs/feet.  Gastrointestinal: Negative for blood in stool, constipation and diarrhea.  Endocrine: Negative for increased urination.  Genitourinary: Negative for painful urination.  Musculoskeletal: Positive for arthralgias, joint pain, joint swelling and morning stiffness. Negative for myalgias, muscle weakness, muscle tenderness and myalgias.  Skin: Negative for color change, pallor, rash, hair loss, nodules/bumps, redness, skin tightness, ulcers and sensitivity to sunlight.  Neurological: Negative for dizziness, numbness and headaches.  Hematological: Negative for swollen glands.    Psychiatric/Behavioral: Negative for depressed mood and sleep disturbance. The patient is not nervous/anxious.     PMFS History:  Patient Active Problem List   Diagnosis Date Noted  . Rheumatoid arthritis of multiple sites with negative rheumatoid factor ([place]) 09/04/2016  . High risk medication use 09/04/2016  . Primary osteoarthritis of right knee 09/04/2016  . History of total knee replacement, left 09/04/2016  . Primary osteoarthritis of both hands 09/04/2016  . Primary osteoarthritis of both feet 09/04/2016  . Essential hypertension 09/04/2016  . Dyslipidemia 09/04/2016    Past Medical History:  Diagnosis Date  . Arthritis   . Cataract immature    right  . Constipation    takes stool softener nightly  . Hx of colonic polyps   . Hyperlipidemia    takes Simvastatin daily  . Hypertension    takes Hyzaar and Amlodipine daily  . Seasonal allergies   . Sensitive skin    pt states she has highly sensitive skin  . Urinary leakage     Family History  Problem Relation Age of Onset  . Thyroid disease Mother   . Alzheimer's disease Mother   . Stroke Brother   . Diabetes Son   . Anesthesia problems Neg Hx   . Hypotension Neg Hx   . Pseudochol deficiency Neg Hx   . Malignant hyperthermia Neg Hx    Past Surgical History:  Procedure Laterality Date  . CATARACT EXTRACTION Bilateral   . COLONOSCOPY    . TONSILLECTOMY  1960  . TOTAL KNEE ARTHROPLASTY  10/30/2011   Procedure: TOTAL KNEE ARTHROPLASTY;  Surgeon: Kerin Salen, MD;  Location: [facility];  Service: Orthopedics;  Laterality: Left;  DEPUY/SIGMA  . VAGINAL HYSTERECTOMY  1970's   partial   Social History   Social History Narrative  . Not on file     Objective: Vital Signs: BP 126/75 (BP Location: Left Arm, Patient Position: Sitting, Cuff Size: Normal)   Pulse 84   Resp 18   Ht 5\' 1"  (1.549 m)   Wt 163 lb (73.9 kg)   BMI 30.80 kg/m    Physical Exam   Musculoskeletal Exam: C-spine, thoracic, lumbar spine good  ROM.  Shoulder joints, elbow joints, and wrist joints good ROM.  MCPs, PIPs, and DIPs good ROM with no synovitis.  CMC swelling and tenderness with palpation and ROM.  Hip joints, knee joints, and ankle joints, MTPs, PIPs, and DIPs good ROM with no synovitis.  No trochanteric bursitis.    CDAI Exam: No CDAI exam completed.    Investigation: No additional findings.PLQ eye exam: 03/12/2017 CBC Latest Ref Rng & Units 05/22/2017 12/12/2016 09/05/2016  WBC 3.8 - 10.8 Thousand/uL 4.0 4.8 5.6  Hemoglobin 11.7 - 15.5 g/dL 13.2 11.9 8.5(L)  Hematocrit 35.0 - 45.0 % 40.0 39.1 28.0(L)  Platelets 140 - 400 Thousand/uL 237 264 389   CMP Latest Ref Rng & Units 05/22/2017 12/12/2016 09/05/2016  Glucose 65 - 99 mg/dL 108(H) 81 91  BUN 7 - 25 mg/dL 15 15 17   Creatinine 0.60 - 0.93 mg/dL 0.91 0.91 0.89  Sodium 135 - 146 mmol/L 139 140 136  Potassium 3.5 - 5.3 mmol/L 4.1 3.8 4.0  Chloride 98 - 110 mmol/L 102 102 101  CO2 20 - 32 mmol/L 30 26 27   Calcium 8.6 - 10.4 mg/dL 10.0 10.0 10.1  Total Protein 6.1 - 8.1 g/dL 6.6 6.6 6.7  Total Bilirubin 0.2 - 1.2 mg/dL 0.4 0.3 0.5  Alkaline Phos 33 - 130 U/L - 63 69  AST 10 - 35 U/L 17 19 19   ALT 6 - 29 U/L 12 12 12     Imaging: No results found.  Speciality Comments: No specialty comments available.   Procedures:  No procedures performed Allergies: Antihistamines, chlorpheniramine-type   Assessment / Plan:     Visit Diagnoses: Rheumatoid arthritis of multiple sites with negative rheumatoid factor (HCC) - -RF +CCP. Ultrasound examination of hands in October 2017 did not show any synovitis there was only synovial thickening.  Patient is no synovitis on exam today.  Her hand pain is due to her osteoarthritis affecting her CMC joints bilaterally.  She will continue on Plaquenil 200 mg BID M-F.  She will continue to have routine labs every 5 months to monitor for drug toxicity.    High risk medication use - PLQeye exam: 03/12/2017.  She has her next eye exam  scheduled.  Her CBC and CMP were WNL in October 2018.  She will have repeat labs in March and every 5 months.    Primary osteoarthritis of right knee: Patient is followed by her Orthopedist.    Primary osteoarthritis of both hands - Patient has significant CMC pain and swelling, especially her right CMC.  Patient declined a [place] brace at this time.  Discussed joint protection and muscle strengthening.  She can continue to take Aleve and Tylenol as needed for pain.  Her CMP was WNL and we will continue to monitor.  A prescription for Voltaren gel was sent to the pharmacy today.  Plan: diclofenac sodium (VOLTAREN) 1 % GEL  Primary osteoarthritis of both feet: No synovitis or discomfort currently.  Discussed the importance of proper fitting shoes.  History of total knee replacement, left - by Dr. Mayer Camel, doing well  Other medical conditions are listed below:   History of hyperlipidemia  History of hypertension    Orders: No orders of the defined types were placed in this encounter.  Meds ordered this encounter  Medications  . diclofenac sodium (VOLTAREN) 1 % GEL    Sig: Apply 3 g topically to 3 large joints 3 times daily    Dispense:  3 Tube    Refill:  3      Follow-Up Instructions: Return in about 5 months (around 12/29/2017) for Rheumatoid arthritis. Bo Merino, MD  Note - This record has been created using Editor, commissioning.  Chart creation errors have been sought, but may not always  have been located. Such creation errors do not reflect on  the standard of medical care.

## 2017-07-31 ENCOUNTER — Encounter: Payer: Self-pay | Admitting: Rheumatology

## 2017-07-31 ENCOUNTER — Ambulatory Visit (INDEPENDENT_AMBULATORY_CARE_PROVIDER_SITE_OTHER): Payer: Commercial Managed Care - HMO | Admitting: Rheumatology

## 2017-07-31 VITALS — BP 126/75 | HR 84 | Resp 18 | Ht 61.0 in | Wt 163.0 lb

## 2017-07-31 DIAGNOSIS — M1711 Unilateral primary osteoarthritis, right knee: Secondary | ICD-10-CM | POA: Diagnosis not present

## 2017-07-31 DIAGNOSIS — Z96652 Presence of left artificial knee joint: Secondary | ICD-10-CM | POA: Diagnosis not present

## 2017-07-31 DIAGNOSIS — M0609 Rheumatoid arthritis without rheumatoid factor, multiple sites: Secondary | ICD-10-CM | POA: Diagnosis not present

## 2017-07-31 DIAGNOSIS — M19041 Primary osteoarthritis, right hand: Secondary | ICD-10-CM

## 2017-07-31 DIAGNOSIS — M19072 Primary osteoarthritis, left ankle and foot: Secondary | ICD-10-CM

## 2017-07-31 DIAGNOSIS — Z8639 Personal history of other endocrine, nutritional and metabolic disease: Secondary | ICD-10-CM

## 2017-07-31 DIAGNOSIS — Z8679 Personal history of other diseases of the circulatory system: Secondary | ICD-10-CM | POA: Diagnosis not present

## 2017-07-31 DIAGNOSIS — M19042 Primary osteoarthritis, left hand: Secondary | ICD-10-CM | POA: Diagnosis not present

## 2017-07-31 DIAGNOSIS — M19071 Primary osteoarthritis, right ankle and foot: Secondary | ICD-10-CM

## 2017-07-31 DIAGNOSIS — Z79899 Other long term (current) drug therapy: Secondary | ICD-10-CM

## 2017-07-31 MED ORDER — DICLOFENAC SODIUM 1 % TD GEL: TRANSDERMAL | 3 refills | Status: DC

## 2017-08-24 DIAGNOSIS — M25562 Pain in left knee: Secondary | ICD-10-CM | POA: Diagnosis not present

## 2017-08-24 DIAGNOSIS — Z96652 Presence of left artificial knee joint: Secondary | ICD-10-CM | POA: Diagnosis not present

## 2017-08-24 DIAGNOSIS — M1711 Unilateral primary osteoarthritis, right knee: Secondary | ICD-10-CM | POA: Diagnosis not present

## 2017-09-10 ENCOUNTER — Other Ambulatory Visit: Payer: Self-pay | Admitting: Rheumatology

## 2017-09-10 NOTE — Telephone Encounter (Signed)
Last Visit: 07/31/17 Next Visit: 01/01/18 Labs: 05/22/17 WNL PLQ Eye Exam: 03/12/17 WNL  Okay to refill per Dr. Estanislado Pandy

## 2017-09-24 DIAGNOSIS — E782 Mixed hyperlipidemia: Secondary | ICD-10-CM | POA: Diagnosis not present

## 2017-09-24 DIAGNOSIS — I10 Essential (primary) hypertension: Secondary | ICD-10-CM | POA: Diagnosis not present

## 2017-09-24 DIAGNOSIS — R7303 Prediabetes: Secondary | ICD-10-CM | POA: Diagnosis not present

## 2017-09-25 DIAGNOSIS — M25572 Pain in left ankle and joints of left foot: Secondary | ICD-10-CM | POA: Diagnosis not present

## 2017-10-01 DIAGNOSIS — J309 Allergic rhinitis, unspecified: Secondary | ICD-10-CM | POA: Diagnosis not present

## 2017-10-01 DIAGNOSIS — R7303 Prediabetes: Secondary | ICD-10-CM | POA: Diagnosis not present

## 2017-10-01 DIAGNOSIS — E782 Mixed hyperlipidemia: Secondary | ICD-10-CM | POA: Diagnosis not present

## 2017-10-01 DIAGNOSIS — I10 Essential (primary) hypertension: Secondary | ICD-10-CM | POA: Diagnosis not present

## 2017-10-02 DIAGNOSIS — M1711 Unilateral primary osteoarthritis, right knee: Secondary | ICD-10-CM | POA: Diagnosis not present

## 2017-10-05 DIAGNOSIS — J301 Allergic rhinitis due to pollen: Secondary | ICD-10-CM | POA: Diagnosis not present

## 2017-10-05 DIAGNOSIS — J3089 Other allergic rhinitis: Secondary | ICD-10-CM | POA: Diagnosis not present

## 2017-10-05 DIAGNOSIS — J3081 Allergic rhinitis due to animal (cat) (dog) hair and dander: Secondary | ICD-10-CM | POA: Diagnosis not present

## 2017-10-05 DIAGNOSIS — R05 Cough: Secondary | ICD-10-CM | POA: Diagnosis not present

## 2017-10-08 DIAGNOSIS — J301 Allergic rhinitis due to pollen: Secondary | ICD-10-CM | POA: Diagnosis not present

## 2017-10-08 DIAGNOSIS — J3089 Other allergic rhinitis: Secondary | ICD-10-CM | POA: Diagnosis not present

## 2017-10-08 DIAGNOSIS — M21962 Unspecified acquired deformity of left lower leg: Secondary | ICD-10-CM | POA: Diagnosis not present

## 2017-10-08 DIAGNOSIS — M21961 Unspecified acquired deformity of right lower leg: Secondary | ICD-10-CM | POA: Diagnosis not present

## 2017-10-08 DIAGNOSIS — J3081 Allergic rhinitis due to animal (cat) (dog) hair and dander: Secondary | ICD-10-CM | POA: Diagnosis not present

## 2017-10-08 DIAGNOSIS — R05 Cough: Secondary | ICD-10-CM | POA: Diagnosis not present

## 2017-10-09 DIAGNOSIS — J3081 Allergic rhinitis due to animal (cat) (dog) hair and dander: Secondary | ICD-10-CM | POA: Diagnosis not present

## 2017-10-09 DIAGNOSIS — J301 Allergic rhinitis due to pollen: Secondary | ICD-10-CM | POA: Diagnosis not present

## 2017-10-09 DIAGNOSIS — J3089 Other allergic rhinitis: Secondary | ICD-10-CM | POA: Diagnosis not present

## 2017-10-09 DIAGNOSIS — R05 Cough: Secondary | ICD-10-CM | POA: Diagnosis not present

## 2017-10-10 DIAGNOSIS — J3089 Other allergic rhinitis: Secondary | ICD-10-CM | POA: Diagnosis not present

## 2017-10-10 DIAGNOSIS — J301 Allergic rhinitis due to pollen: Secondary | ICD-10-CM | POA: Diagnosis not present

## 2017-10-10 DIAGNOSIS — R05 Cough: Secondary | ICD-10-CM | POA: Diagnosis not present

## 2017-10-10 DIAGNOSIS — J3081 Allergic rhinitis due to animal (cat) (dog) hair and dander: Secondary | ICD-10-CM | POA: Diagnosis not present

## 2017-10-11 DIAGNOSIS — R05 Cough: Secondary | ICD-10-CM | POA: Diagnosis not present

## 2017-10-11 DIAGNOSIS — J3081 Allergic rhinitis due to animal (cat) (dog) hair and dander: Secondary | ICD-10-CM | POA: Diagnosis not present

## 2017-10-11 DIAGNOSIS — M1711 Unilateral primary osteoarthritis, right knee: Secondary | ICD-10-CM | POA: Diagnosis not present

## 2017-10-11 DIAGNOSIS — J3089 Other allergic rhinitis: Secondary | ICD-10-CM | POA: Diagnosis not present

## 2017-10-11 DIAGNOSIS — J301 Allergic rhinitis due to pollen: Secondary | ICD-10-CM | POA: Diagnosis not present

## 2017-10-12 DIAGNOSIS — J301 Allergic rhinitis due to pollen: Secondary | ICD-10-CM | POA: Diagnosis not present

## 2017-10-12 DIAGNOSIS — J3089 Other allergic rhinitis: Secondary | ICD-10-CM | POA: Diagnosis not present

## 2017-10-12 DIAGNOSIS — R05 Cough: Secondary | ICD-10-CM | POA: Diagnosis not present

## 2017-10-12 DIAGNOSIS — J3081 Allergic rhinitis due to animal (cat) (dog) hair and dander: Secondary | ICD-10-CM | POA: Diagnosis not present

## 2017-10-15 DIAGNOSIS — J3089 Other allergic rhinitis: Secondary | ICD-10-CM | POA: Diagnosis not present

## 2017-10-15 DIAGNOSIS — J301 Allergic rhinitis due to pollen: Secondary | ICD-10-CM | POA: Diagnosis not present

## 2017-10-15 DIAGNOSIS — J3081 Allergic rhinitis due to animal (cat) (dog) hair and dander: Secondary | ICD-10-CM | POA: Diagnosis not present

## 2017-10-15 DIAGNOSIS — R05 Cough: Secondary | ICD-10-CM | POA: Diagnosis not present

## 2017-10-16 DIAGNOSIS — R05 Cough: Secondary | ICD-10-CM | POA: Diagnosis not present

## 2017-10-16 DIAGNOSIS — J301 Allergic rhinitis due to pollen: Secondary | ICD-10-CM | POA: Diagnosis not present

## 2017-10-16 DIAGNOSIS — J3081 Allergic rhinitis due to animal (cat) (dog) hair and dander: Secondary | ICD-10-CM | POA: Diagnosis not present

## 2017-10-16 DIAGNOSIS — J3089 Other allergic rhinitis: Secondary | ICD-10-CM | POA: Diagnosis not present

## 2017-10-16 DIAGNOSIS — M1711 Unilateral primary osteoarthritis, right knee: Secondary | ICD-10-CM | POA: Diagnosis not present

## 2017-11-13 DIAGNOSIS — M1711 Unilateral primary osteoarthritis, right knee: Secondary | ICD-10-CM | POA: Diagnosis not present

## 2017-11-27 ENCOUNTER — Encounter: Payer: Self-pay | Admitting: Rheumatology

## 2017-11-27 DIAGNOSIS — Z961 Presence of intraocular lens: Secondary | ICD-10-CM | POA: Diagnosis not present

## 2017-11-27 DIAGNOSIS — Z79899 Other long term (current) drug therapy: Secondary | ICD-10-CM | POA: Diagnosis not present

## 2017-11-27 DIAGNOSIS — H43823 Vitreomacular adhesion, bilateral: Secondary | ICD-10-CM | POA: Diagnosis not present

## 2017-11-27 DIAGNOSIS — H35369 Drusen (degenerative) of macula, unspecified eye: Secondary | ICD-10-CM | POA: Diagnosis not present

## 2017-12-03 ENCOUNTER — Other Ambulatory Visit: Payer: Self-pay | Admitting: Rheumatology

## 2017-12-03 NOTE — Telephone Encounter (Signed)
Last Visit: 07/31/17 Next Visit: 01/01/18 Labs: 05/22/17 WNL PLQ Eye Exam: 03/12/17 WNL  Attempted to contact the patient to advise she needs labs. Unable to leave a message  Okay to refill 30 day supply per Dr. Estanislado Pandy

## 2017-12-05 ENCOUNTER — Telehealth: Payer: Self-pay

## 2017-12-05 NOTE — Telephone Encounter (Signed)
Patient wants refills on Pantoprazole, would you like to give her any?

## 2017-12-05 NOTE — Telephone Encounter (Signed)
Pantoprazole 40 mg p.o. once a day 30 with 6 refills 

## 2017-12-07 MED ORDER — PANTOPRAZOLE SODIUM 40 MG PO TBEC: 40.0000 mg | DELAYED_RELEASE_TABLET | Freq: Every day | ORAL | 6 refills | Status: DC

## 2017-12-07 NOTE — Telephone Encounter (Signed)
Sent medication refill to patients pharmacy.

## 2017-12-18 ENCOUNTER — Other Ambulatory Visit: Payer: Self-pay | Admitting: *Deleted

## 2017-12-18 DIAGNOSIS — Z79899 Other long term (current) drug therapy: Secondary | ICD-10-CM

## 2017-12-18 LAB — COMPLETE METABOLIC PANEL WITH GFR
AG RATIO: 1.7 (calc) (ref 1.0–2.5)
ALKALINE PHOSPHATASE (APISO): 73 U/L (ref 33–130)
ALT: 14 U/L (ref 6–29)
AST: 20 U/L (ref 10–35)
Albumin: 4.1 g/dL (ref 3.6–5.1)
BILIRUBIN TOTAL: 0.4 mg/dL (ref 0.2–1.2)
BUN: 11 mg/dL (ref 7–25)
CHLORIDE: 100 mmol/L (ref 98–110)
CO2: 32 mmol/L (ref 20–32)
Calcium: 10.2 mg/dL (ref 8.6–10.4)
Creat: 0.9 mg/dL (ref 0.60–0.93)
GFR, Est African American: 72 mL/min/{1.73_m2} (ref >=60)
GFR, Est Non African American: 63 mL/min/{1.73_m2} (ref >=60)
Globulin: 2.4 g/dL (calc) (ref 1.9–3.7)
Glucose, Bld: 105 mg/dL — ABNORMAL HIGH (ref 65–99)
Potassium: 3.9 mmol/L (ref 3.5–5.3)
Sodium: 140 mmol/L (ref 135–146)
Total Protein: 6.5 g/dL (ref 6.1–8.1)

## 2017-12-18 LAB — CBC WITH DIFFERENTIAL/PLATELET
Basophils Absolute: 48 cells/uL (ref 0–200)
Basophils Relative: 1 %
EOS ABS: 427 {cells}/uL (ref 15–500)
Eosinophils Relative: 8.9 %
HCT: 40.3 % (ref 35.0–45.0)
Hemoglobin: 13.8 g/dL (ref 11.7–15.5)
Lymphs Abs: 1925 cells/uL (ref 850–3900)
MCH: 32.2 pg (ref 27.0–33.0)
MCHC: 34.2 g/dL (ref 32.0–36.0)
MCV: 93.9 fL (ref 80.0–100.0)
MPV: 10 fL (ref 7.5–12.5)
Monocytes Relative: 11.6 %
Neutro Abs: 1843 cells/uL (ref 1500–7800)
Neutrophils Relative %: 38.4 %
PLATELETS: 240 10*3/uL (ref 140–400)
RBC: 4.29 10*6/uL (ref 3.80–5.10)
RDW: 12.1 % (ref 11.0–15.0)
TOTAL LYMPHOCYTE: 40.1 %
WBC: 4.8 10*3/uL (ref 3.8–10.8)
WBCMIX: 557 {cells}/uL (ref 200–950)

## 2017-12-20 NOTE — Progress Notes (Signed)
Office Visit Note  Patient: Ann Gamble             Date of Birth: 08-24-1940           MRN: [id number]             PCP: Maryella Shivers, MD Referring: Maryella Shivers, MD Visit Date: 01/01/2018 Occupation: @GUAROCC @    Subjective:  Medication management.   History of Present Illness: Ann Gamble is a 77 y.o. female with history of seropositive rheumatoid arthritis and osteoarthritis.  She states arthritis seems to be quite well controlled with Plaquenil.  She denies any joint swelling.  She states she gets edema in her bilateral lower extremities at the end of the day.  He gets some stiffness in her left total knee which is been replaced.  Her right knee joint continues to cause some discomfort.  Activities of Daily Living:  Patient reports morning stiffness for 30 minutes.   Patient Reports nocturnal pain.  Difficulty dressing/grooming: Denies Difficulty climbing stairs: Reports Difficulty getting out of chair: Denies Difficulty using hands for taps, buttons, cutlery, and/or writing: Reports   Review of Systems  Constitutional: Positive for fatigue. Negative for night sweats, weight gain and weight loss.  HENT: Positive for mouth dryness. Negative for mouth sores, trouble swallowing, trouble swallowing and nose dryness.   Eyes: Positive for dryness. Negative for pain, redness and visual disturbance.  Respiratory: Negative for cough, shortness of breath, wheezing and difficulty breathing.   Cardiovascular: Negative for chest pain, palpitations, hypertension, irregular heartbeat and swelling in legs/feet.  Gastrointestinal: Negative for abdominal pain, blood in stool, constipation and diarrhea.  Endocrine: Negative for increased urination.  Genitourinary: Negative for pelvic pain and vaginal dryness.  Musculoskeletal: Positive for arthralgias, joint pain and morning stiffness. Negative for joint swelling, myalgias, muscle weakness, muscle tenderness and myalgias.    Skin: Negative for color change, rash, hair loss, skin tightness, ulcers and sensitivity to sunlight.  Allergic/Immunologic: Negative for susceptible to infections.  Neurological: Negative for dizziness, light-headedness, headaches, memory loss, night sweats and weakness.  Hematological: Negative for bruising/bleeding tendency and swollen glands.  Psychiatric/Behavioral: Negative for depressed mood, confusion and sleep disturbance. The patient is not nervous/anxious.     PMFS History:  Patient Active Problem List   Diagnosis Date Noted  . Rheumatoid arthritis of multiple sites with negative rheumatoid factor ([place]) 09/04/2016  . High risk medication use 09/04/2016  . Primary osteoarthritis of right knee 09/04/2016  . History of total knee replacement, left 09/04/2016  . Primary osteoarthritis of both hands 09/04/2016  . Primary osteoarthritis of both feet 09/04/2016  . Essential hypertension 09/04/2016  . Dyslipidemia 09/04/2016    Past Medical History:  Diagnosis Date  . Arthritis   . Cataract immature    right  . Constipation    takes stool softener nightly  . Hx of colonic polyps   . Hyperlipidemia    takes Simvastatin daily  . Hypertension    takes Hyzaar and Amlodipine daily  . Seasonal allergies   . Sensitive skin    pt states she has highly sensitive skin  . Urinary leakage     Family History  Problem Relation Age of Onset  . Thyroid disease Mother   . Alzheimer's disease Mother   . Stroke Brother   . Diabetes Son   . Anesthesia problems Neg Hx   . Hypotension Neg Hx   . Pseudochol deficiency Neg Hx   . Malignant hyperthermia Neg Hx  Past Surgical History:  Procedure Laterality Date  . CATARACT EXTRACTION Bilateral   . COLONOSCOPY    . TONSILLECTOMY  1960  . TOTAL KNEE ARTHROPLASTY  10/30/2011   Procedure: TOTAL KNEE ARTHROPLASTY;  Surgeon: Kerin Salen, MD;  Location: [facility];  Service: Orthopedics;  Laterality: Left;  DEPUY/SIGMA  . VAGINAL HYSTERECTOMY   1970's   partial   Social History   Social History Narrative  . Not on file     Objective: Vital Signs: BP 140/88 (BP Location: Left Arm, Patient Position: Sitting, Cuff Size: Normal)   Pulse 94   Ht 5\' 1"  (1.549 m)   Wt 166 lb 8 oz (75.5 kg)   BMI 31.46 kg/m    Physical Exam  Constitutional: She is oriented to person, place, and time. She appears well-developed and well-nourished.  HENT:  Head: Normocephalic and atraumatic.  Eyes: Conjunctivae and EOM are normal.  Neck: Normal range of motion.  Cardiovascular: Normal rate, regular rhythm, normal heart sounds and intact distal pulses.  Pulmonary/Chest: Effort normal and breath sounds normal.  Abdominal: Soft. Bowel sounds are normal.  Lymphadenopathy:    She has no cervical adenopathy.  Neurological: She is alert and oriented to person, place, and time.  Skin: Skin is warm and dry. Capillary refill takes less than 2 seconds.  Psychiatric: She has a normal mood and affect. Her behavior is normal.  Nursing note and vitals reviewed.    Musculoskeletal Exam: C-spine thoracic lumbar spine good range of motion.  Shoulder joints elbow joints wrist joints are good range of motion.  She has some DIP PIP thickening in her hands consistent with osteoarthritis.  She has some tenderness in her right wrist I did not appreciate any synovitis.  She had discomfort in her bilateral knee joints her left knee joint is replaced.  Right knee joint has some warmth but no effusion.  She also has some tenderness in her ankles without any warmth swelling or effusion.  CDAI Exam: CDAI Homunculus Exam:   Tenderness:  RUE: wrist RLE: tibiofemoral  Swelling:  RLE: tibiofemoral  Joint Counts:  CDAI Tender Joint count: 2 CDAI Swollen Joint count: 1  Global Assessments:  Patient Global Assessment: 4 Provider Global Assessment: 4  CDAI Calculated Score: 11    Investigation: No additional findings.PLQ eye exam: 03/12/2017 CBC Latest Ref Rng  & Units 12/18/2017 05/22/2017 12/12/2016  WBC 3.8 - 10.8 Thousand/uL 4.8 4.0 4.8  Hemoglobin 11.7 - 15.5 g/dL 13.8 13.2 11.9  Hematocrit 35.0 - 45.0 % 40.3 40.0 39.1  Platelets 140 - 400 Thousand/uL 240 237 264   CMP Latest Ref Rng & Units 12/18/2017 05/22/2017 12/12/2016  Glucose 65 - 99 mg/dL 105(H) 108(H) 81  BUN 7 - 25 mg/dL 11 15 15   Creatinine 0.60 - 0.93 mg/dL 0.90 0.91 0.91  Sodium 135 - 146 mmol/L 140 139 140  Potassium 3.5 - 5.3 mmol/L 3.9 4.1 3.8  Chloride 98 - 110 mmol/L 100 102 102  CO2 20 - 32 mmol/L 32 30 26  Calcium 8.6 - 10.4 mg/dL 10.2 10.0 10.0  Total Protein 6.1 - 8.1 g/dL 6.5 6.6 6.6  Total Bilirubin 0.2 - 1.2 mg/dL 0.4 0.4 0.3  Alkaline Phos 33 - 130 U/L - - 63  AST 10 - 35 U/L 20 17 19   ALT 6 - 29 U/L 14 12 12     Imaging: No results found.  Speciality Comments: PLQ Eye Exam: 11/27/17 WNL @ [facility] Follow up in 1 year  Procedures:  No procedures performed Allergies: Antihistamines, chlorpheniramine-type   Assessment / Plan:     Visit Diagnoses: Rheumatoid arthritis of multiple sites with negative rheumatoid factor (HCC) - -RF +CCP.  Patient reports intermittent swelling in her joints.  She has some discomfort with range of motion of her right wrist joint.  She is some warmth in her right knee no joint.  She has been taking Plaquenil 200 mg p.o. twice daily Monday through Friday.  Her ideal dose will be 377 mg of Plaquenil.  I have increased her dose to 1 tablet p.o. twice daily.  Prescription refill was given today.  High risk medication use - PLQ 200 mg po bid M-Feye exam: 03/12/2017.  Her next eye exam is coming up in July.  Primary osteoarthritis of both hands-joint protection muscle strengthening was discussed.  Primary osteoarthritis of right knee - Patient is followed by Dr. Mayer Camel.  Patient states that she had Euflexxa injections recently.  She has discussed total knee replacement in future.  History of total knee replacement, left - by Dr.  Mayer Camel  Primary osteoarthritis of both feet-she has some stiffness in her feet.  History of hypertension  History of hyperlipidemia    Orders: No orders of the defined types were placed in this encounter.  Meds ordered this encounter  Medications  . hydroxychloroquine (PLAQUENIL) 200 MG tablet    Sig: Take 1 tablet twice daily    Dispense:  60 tablet    Refill:  2    Patient will need labs before next refill.    Face-to-face time spent with patient was 30 minutes. >50% of time was spent in counseling and coordination of care.  Follow-Up Instructions: Return in about 5 months (around 06/03/2018) for Rheumatoid arthritis, Osteoarthritis.   Bo Merino, MD  Note - This record has been created using Editor, commissioning.  Chart creation errors have been sought, but may not always  have been located. Such creation errors do not reflect on  the standard of medical care.

## 2018-01-01 ENCOUNTER — Ambulatory Visit (INDEPENDENT_AMBULATORY_CARE_PROVIDER_SITE_OTHER): Payer: Medicare HMO | Admitting: Rheumatology

## 2018-01-01 ENCOUNTER — Encounter: Payer: Self-pay | Admitting: Rheumatology

## 2018-01-01 VITALS — BP 140/88 | HR 94 | Ht 61.0 in | Wt 166.5 lb

## 2018-01-01 DIAGNOSIS — M19072 Primary osteoarthritis, left ankle and foot: Secondary | ICD-10-CM

## 2018-01-01 DIAGNOSIS — Z8639 Personal history of other endocrine, nutritional and metabolic disease: Secondary | ICD-10-CM | POA: Diagnosis not present

## 2018-01-01 DIAGNOSIS — M1711 Unilateral primary osteoarthritis, right knee: Secondary | ICD-10-CM | POA: Diagnosis not present

## 2018-01-01 DIAGNOSIS — Z79899 Other long term (current) drug therapy: Secondary | ICD-10-CM | POA: Diagnosis not present

## 2018-01-01 DIAGNOSIS — Z96652 Presence of left artificial knee joint: Secondary | ICD-10-CM | POA: Diagnosis not present

## 2018-01-01 DIAGNOSIS — M0609 Rheumatoid arthritis without rheumatoid factor, multiple sites: Secondary | ICD-10-CM

## 2018-01-01 DIAGNOSIS — M19071 Primary osteoarthritis, right ankle and foot: Secondary | ICD-10-CM

## 2018-01-01 DIAGNOSIS — M19042 Primary osteoarthritis, left hand: Secondary | ICD-10-CM

## 2018-01-01 DIAGNOSIS — M19041 Primary osteoarthritis, right hand: Secondary | ICD-10-CM | POA: Diagnosis not present

## 2018-01-01 DIAGNOSIS — Z8679 Personal history of other diseases of the circulatory system: Secondary | ICD-10-CM | POA: Diagnosis not present

## 2018-01-01 MED ORDER — HYDROXYCHLOROQUINE SULFATE 200 MG PO TABS: ORAL_TABLET | ORAL | 2 refills | Status: DC

## 2018-03-01 DIAGNOSIS — Z79899 Other long term (current) drug therapy: Secondary | ICD-10-CM | POA: Diagnosis not present

## 2018-03-29 ENCOUNTER — Other Ambulatory Visit: Payer: Self-pay | Admitting: Rheumatology

## 2018-03-29 NOTE — Telephone Encounter (Signed)
Last visit: 01/01/2018 Next visit: 06/04/2018 Labs: 12/18/2017 Glucose is 105. All other lab values are WNL. Eye exam: 11/27/2017  Okay to refill per Dr. Estanislado Pandy.

## 2018-04-12 DIAGNOSIS — I10 Essential (primary) hypertension: Secondary | ICD-10-CM | POA: Diagnosis not present

## 2018-04-12 DIAGNOSIS — E782 Mixed hyperlipidemia: Secondary | ICD-10-CM | POA: Diagnosis not present

## 2018-04-12 DIAGNOSIS — R7303 Prediabetes: Secondary | ICD-10-CM | POA: Diagnosis not present

## 2018-04-26 DIAGNOSIS — I10 Essential (primary) hypertension: Secondary | ICD-10-CM | POA: Diagnosis not present

## 2018-04-26 DIAGNOSIS — K219 Gastro-esophageal reflux disease without esophagitis: Secondary | ICD-10-CM | POA: Diagnosis not present

## 2018-04-26 DIAGNOSIS — E782 Mixed hyperlipidemia: Secondary | ICD-10-CM | POA: Diagnosis not present

## 2018-04-26 DIAGNOSIS — M1711 Unilateral primary osteoarthritis, right knee: Secondary | ICD-10-CM | POA: Diagnosis not present

## 2018-04-26 DIAGNOSIS — Z23 Encounter for immunization: Secondary | ICD-10-CM | POA: Diagnosis not present

## 2018-04-30 ENCOUNTER — Other Ambulatory Visit: Payer: Self-pay | Admitting: Rheumatology

## 2018-04-30 NOTE — Telephone Encounter (Signed)
Last visit: 01/01/2018 Next visit: 06/04/2018 Labs: 12/18/2017 Glucose is 105. All other lab values are WNL. Eye exam: 11/27/2017  Okay to refill per Dr. Estanislado Pandy.

## 2018-05-07 DIAGNOSIS — M25561 Pain in right knee: Secondary | ICD-10-CM | POA: Diagnosis not present

## 2018-05-07 DIAGNOSIS — M1711 Unilateral primary osteoarthritis, right knee: Secondary | ICD-10-CM | POA: Diagnosis not present

## 2018-05-07 DIAGNOSIS — G8929 Other chronic pain: Secondary | ICD-10-CM | POA: Diagnosis not present

## 2018-05-17 DIAGNOSIS — I10 Essential (primary) hypertension: Secondary | ICD-10-CM | POA: Diagnosis not present

## 2018-05-17 DIAGNOSIS — M069 Rheumatoid arthritis, unspecified: Secondary | ICD-10-CM | POA: Diagnosis not present

## 2018-05-17 DIAGNOSIS — R7303 Prediabetes: Secondary | ICD-10-CM | POA: Diagnosis not present

## 2018-05-17 DIAGNOSIS — M1611 Unilateral primary osteoarthritis, right hip: Secondary | ICD-10-CM | POA: Diagnosis not present

## 2018-05-20 DIAGNOSIS — Z9181 History of falling: Secondary | ICD-10-CM | POA: Diagnosis not present

## 2018-05-20 DIAGNOSIS — Z0000 Encounter for general adult medical examination without abnormal findings: Secondary | ICD-10-CM | POA: Diagnosis not present

## 2018-05-20 DIAGNOSIS — Z139 Encounter for screening, unspecified: Secondary | ICD-10-CM | POA: Diagnosis not present

## 2018-05-20 DIAGNOSIS — Z1331 Encounter for screening for depression: Secondary | ICD-10-CM | POA: Diagnosis not present

## 2018-05-28 NOTE — Progress Notes (Signed)
Office Visit Note  Patient: Ann Gamble             Date of Birth: 15-Aug-1940           MRN: [id number]             PCP: Maryella Shivers, MD Referring: Maryella Shivers, MD Visit Date: 06/11/2018 Occupation: @GUAROCC @  Subjective:  Right knee pain   History of Present Illness: Ann Gamble is a 77 y.o. female with history of seronegative rheumatoid arthritis and osteoarthritis.  Patient is on Plaquenil 200 mg 1 tablet twice daily.  She reports intermittent discomfort in bilateral hands.  She states that she performs hand exercises as well as massage on a regular basis.  States that she has noticed some decreased grip strength as well as weakness in bilateral wrist.  She states she drops things periodically.  She reports she experiences intermittent numbness in her left index finger.  She states that she has had nerve conduction studies in the past.  She reports that she will be having a right knee replacement on 08/05/2018 performed by Dr. Ronnie Derby.  She states that her right knee swells intermittently.  She denies any pain of the left knee replacment at this time.  She states that her bilateral arches have fallen and she needs custom orthotics but cannot afford them at this time.    Activities of Daily Living:  Patient reports morning stiffness for 2  minutes.   Patient Denies nocturnal pain.  Difficulty dressing/grooming: Denies Difficulty climbing stairs: Reports Difficulty getting out of chair: Reports Difficulty using hands for taps, buttons, cutlery, and/or writing: Reports  Review of Systems  Constitutional: Positive for fatigue.  HENT: Positive for mouth dryness. Negative for mouth sores and nose dryness.   Eyes: Negative for pain, visual disturbance and dryness.  Respiratory: Negative for cough, hemoptysis, shortness of breath and difficulty breathing.   Cardiovascular: Negative for chest pain, palpitations, hypertension and swelling in legs/feet.    Gastrointestinal: Positive for heartburn. Negative for blood in stool, constipation and diarrhea.  Endocrine: Negative for increased urination.  Genitourinary: Negative for painful urination.  Musculoskeletal: Positive for arthralgias, joint pain and morning stiffness. Negative for joint swelling, myalgias, muscle weakness, muscle tenderness and myalgias.  Skin: Negative for color change, pallor, rash, hair loss, nodules/bumps, skin tightness, ulcers and sensitivity to sunlight.  Allergic/Immunologic: Negative for susceptible to infections.  Neurological: Negative for dizziness, numbness, headaches and weakness.  Hematological: Negative for swollen glands.  Psychiatric/Behavioral: Negative for depressed mood and sleep disturbance. The patient is nervous/anxious.     PMFS History:  Patient Active Problem List   Diagnosis Date Noted  . Rheumatoid arthritis of multiple sites with negative rheumatoid factor ([place]) 09/04/2016  . High risk medication use 09/04/2016  . Primary osteoarthritis of right knee 09/04/2016  . History of total knee replacement, left 09/04/2016  . Primary osteoarthritis of both hands 09/04/2016  . Primary osteoarthritis of both feet 09/04/2016  . Essential hypertension 09/04/2016  . Dyslipidemia 09/04/2016    Past Medical History:  Diagnosis Date  . Arthritis   . Cataract immature    right  . Constipation    takes stool softener nightly  . Hx of colonic polyps   . Hyperlipidemia    takes Simvastatin daily  . Hypertension    takes Hyzaar and Amlodipine daily  . Seasonal allergies   . Sensitive skin    pt states she has highly sensitive skin  . Urinary leakage  Family History  Problem Relation Age of Onset  . Thyroid disease Mother   . Alzheimer's disease Mother   . Stroke Brother   . Diabetes Son   . Anesthesia problems Neg Hx   . Hypotension Neg Hx   . Pseudochol deficiency Neg Hx   . Malignant hyperthermia Neg Hx    Past Surgical History:   Procedure Laterality Date  . CATARACT EXTRACTION Bilateral   . COLONOSCOPY    . TONSILLECTOMY  1960  . TOTAL KNEE ARTHROPLASTY  10/30/2011   Procedure: TOTAL KNEE ARTHROPLASTY;  Surgeon: Kerin Salen, MD;  Location: [place];  Service: Orthopedics;  Laterality: Left;  DEPUY/SIGMA  . VAGINAL HYSTERECTOMY  1970's   partial   Social History   Social History Narrative  . Not on file    Objective: Vital Signs: BP 105/63 (BP Location: Left Arm, Patient Position: Sitting, Cuff Size: Normal)   Pulse 89   Resp 14   Ht 4' 11.5" (1.511 m)   Wt 163 lb 12.8 oz (74.3 kg)   BMI 32.53 kg/m    Physical Exam  Constitutional: She is oriented to person, place, and time. She appears well-developed and well-nourished.  HENT:  Head: Normocephalic and atraumatic.  Eyes: Conjunctivae and EOM are normal.  Neck: Normal range of motion.  Cardiovascular: Normal rate, regular rhythm, normal heart sounds and intact distal pulses.  Pulmonary/Chest: Effort normal and breath sounds normal.  Abdominal: Soft. Bowel sounds are normal.  Lymphadenopathy:    She has no cervical adenopathy.  Neurological: She is alert and oriented to person, place, and time.  Skin: Skin is warm and dry. Capillary refill takes less than 2 seconds.  Psychiatric: She has a normal mood and affect. Her behavior is normal.  Nursing note and vitals reviewed.    Musculoskeletal Exam: C-spine,thoracic spine, and lumbar spine good ROM.  No midline spinal tenderness.  No SI joint tenderness.  Shoulder joints, elbow joints, wrist joints, MCPs, PIPs, and DIPs good ROM with no synovitis. PIP and DIP synovial thickening consistent with osteoarthritis.  Hip joints good ROM with no discomfort.  Right knee discomfort with ROM.  Left knee full ROM with no discomfort. No tenderness or swelling of ankle joints.    CDAI Exam: CDAI Score: 1  Patient Global Assessment: 5 (mm); Provider Global Assessment: 5 (mm) Swollen: 0 ; Tender: 0  Joint Exam    Not documented   There is currently no information documented on the homunculus. Go to the Rheumatology activity and complete the homunculus joint exam.  Investigation: No additional findings.  Imaging: No results found.  Recent Labs: Lab Results  Component Value Date   WBC 4.8 12/18/2017   HGB 13.8 12/18/2017   PLT 240 12/18/2017   NA 140 12/18/2017   K 3.9 12/18/2017   CL 100 12/18/2017   CO2 32 12/18/2017   GLUCOSE 105 (H) 12/18/2017   BUN 11 12/18/2017   CREATININE 0.90 12/18/2017   BILITOT 0.4 12/18/2017   ALKPHOS 63 12/12/2016   AST 20 12/18/2017   ALT 14 12/18/2017   PROT 6.5 12/18/2017   ALBUMIN 4.0 12/12/2016   CALCIUM 10.2 12/18/2017   GFRAA 72 12/18/2017    Speciality Comments: PLQ Eye Exam: 11/27/17 WNL @ [facility] Follow up in 1 year  Procedures:  No procedures performed Allergies: Antihistamines, chlorpheniramine-type   Assessment / Plan:     Visit Diagnoses: 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  She has not had any recent rheumatoid arthritis flares.  She has intermittent discomfort in bilateral hands.  She is clinically doing well on Plaquenil 200 mg 1 tablet by mouth twice daily.  She does not need any refills at this time.  She was advised to notify us if she develops increased joint pain or joint swelling.  She will follow-up in the office in 5 months.  High risk medication use - PLQ. eye exam: 11/27/2017.  CBC and CMP will be drawn today to monitor for drug toxicity.- Plan: CBC with Differential/Platelet, COMPLETE METABOLIC PANEL WITH GFR  Primary osteoarthritis of both hands: She has PIP and DIP synovial thickening consistent with osteoarthritis of bilateral hands.  She has complete fist formation bilaterally.  Joint protection and muscle strengthening were discussed.  Primary osteoarthritis of right knee -no warmth or effusion.  She has discomfort with range of  motion.  She will be having a right knee replacement performed by Dr. Ronnie Derby on 08/05/2018.  History of total knee replacement, left - by Dr. Mayer Camel: No warmth or effusion.  Good range of motion.  Primary osteoarthritis of both feet: Bilateral arches have fallen.  She needs custom orthotics but cannot afford them at this time.  Other medical conditions are listed as follows:  History of hyperlipidemia  History of hypertension   Orders: Orders Placed This Encounter  Procedures  . CBC with Differential/Platelet  . COMPLETE METABOLIC PANEL WITH GFR   No orders of the defined types were placed in this encounter.    Follow-Up Instructions: Return in about 5 months (around 11/10/2018) for Rheumatoid arthritis.   Ofilia Neas, PA-C I examined and evaluated the patient with Hazel Sams PA.  Patient had no synovitis on examination today.  She is doing well on Plaquenil.  She continues to have some discomfort in her knee joints due to underlying osteoarthritis.  Left total knee replacement is doing well.  The plan of care was discussed as noted above.  Bo Merino, MD Note - This record has been created using Editor, commissioning.  Chart creation errors have been sought, but may not always  have been located. Such creation errors do not reflect on  the standard of medical care.

## 2018-06-04 ENCOUNTER — Ambulatory Visit: Payer: Medicare HMO | Admitting: Rheumatology

## 2018-06-11 ENCOUNTER — Encounter: Payer: Self-pay | Admitting: Rheumatology

## 2018-06-11 ENCOUNTER — Ambulatory Visit (INDEPENDENT_AMBULATORY_CARE_PROVIDER_SITE_OTHER): Payer: Medicare HMO | Admitting: Rheumatology

## 2018-06-11 VITALS — BP 105/63 | HR 89 | Resp 14 | Ht 59.5 in | Wt 163.8 lb

## 2018-06-11 DIAGNOSIS — M1711 Unilateral primary osteoarthritis, right knee: Secondary | ICD-10-CM | POA: Diagnosis not present

## 2018-06-11 DIAGNOSIS — Z96652 Presence of left artificial knee joint: Secondary | ICD-10-CM

## 2018-06-11 DIAGNOSIS — Z8679 Personal history of other diseases of the circulatory system: Secondary | ICD-10-CM | POA: Diagnosis not present

## 2018-06-11 DIAGNOSIS — M19072 Primary osteoarthritis, left ankle and foot: Secondary | ICD-10-CM

## 2018-06-11 DIAGNOSIS — Z79899 Other long term (current) drug therapy: Secondary | ICD-10-CM

## 2018-06-11 DIAGNOSIS — M19042 Primary osteoarthritis, left hand: Secondary | ICD-10-CM | POA: Diagnosis not present

## 2018-06-11 DIAGNOSIS — M19041 Primary osteoarthritis, right hand: Secondary | ICD-10-CM

## 2018-06-11 DIAGNOSIS — M19071 Primary osteoarthritis, right ankle and foot: Secondary | ICD-10-CM

## 2018-06-11 DIAGNOSIS — M0609 Rheumatoid arthritis without rheumatoid factor, multiple sites: Secondary | ICD-10-CM | POA: Diagnosis not present

## 2018-06-11 DIAGNOSIS — Z8639 Personal history of other endocrine, nutritional and metabolic disease: Secondary | ICD-10-CM | POA: Diagnosis not present

## 2018-06-12 LAB — COMPLETE METABOLIC PANEL WITH GFR
AG RATIO: 1.5 (calc) (ref 1.0–2.5)
ALT: 14 U/L (ref 6–29)
AST: 24 U/L (ref 10–35)
Albumin: 4.4 g/dL (ref 3.6–5.1)
Alkaline phosphatase (APISO): 78 U/L (ref 33–130)
BUN: 18 mg/dL (ref 7–25)
CALCIUM: 10.8 mg/dL — AB (ref 8.6–10.4)
CHLORIDE: 103 mmol/L (ref 98–110)
CO2: 28 mmol/L (ref 20–32)
Creat: 0.84 mg/dL (ref 0.60–0.93)
GFR, EST NON AFRICAN AMERICAN: 68 mL/min/{1.73_m2} (ref >=60)
GFR, Est African American: 78 mL/min/{1.73_m2} (ref >=60)
Globulin: 2.9 g/dL (calc) (ref 1.9–3.7)
Glucose, Bld: 82 mg/dL (ref 65–99)
POTASSIUM: 4 mmol/L (ref 3.5–5.3)
Sodium: 142 mmol/L (ref 135–146)
Total Bilirubin: 0.4 mg/dL (ref 0.2–1.2)
Total Protein: 7.3 g/dL (ref 6.1–8.1)

## 2018-06-12 LAB — CBC WITH DIFFERENTIAL/PLATELET
BASOS ABS: 52 {cells}/uL (ref 0–200)
Basophils Relative: 1 %
EOS ABS: 510 {cells}/uL — AB (ref 15–500)
Eosinophils Relative: 9.8 %
HCT: 42 % (ref 35.0–45.0)
Hemoglobin: 14.2 g/dL (ref 11.7–15.5)
Lymphs Abs: 1888 cells/uL (ref 850–3900)
MCH: 31.8 pg (ref 27.0–33.0)
MCHC: 33.8 g/dL (ref 32.0–36.0)
MCV: 94 fL (ref 80.0–100.0)
MONOS PCT: 11.9 %
MPV: 10.2 fL (ref 7.5–12.5)
NEUTROS PCT: 41 %
Neutro Abs: 2132 cells/uL (ref 1500–7800)
PLATELETS: 252 10*3/uL (ref 140–400)
RBC: 4.47 10*6/uL (ref 3.80–5.10)
RDW: 12.1 % (ref 11.0–15.0)
TOTAL LYMPHOCYTE: 36.3 %
WBC mixed population: 619 cells/uL (ref 200–950)
WBC: 5.2 10*3/uL (ref 3.8–10.8)

## 2018-06-12 NOTE — Progress Notes (Signed)
CBC WNL. Calcium elevated. Please advise patient to avoid taking calcium supplement at this time.

## 2018-07-15 ENCOUNTER — Other Ambulatory Visit: Payer: Self-pay | Admitting: Orthopedic Surgery

## 2018-07-26 ENCOUNTER — Encounter (HOSPITAL_COMMUNITY): Payer: Self-pay

## 2018-07-26 NOTE — Patient Instructions (Addendum)
Your procedure is scheduled on: Monday, Dec. 23, 2019   Surgery Time: 8:00AM-9:15AM   Report to [facility] Main  Entrance    Report to admitting 5:30 at AM   Call this number if you have problems the morning of surgery [phone]   Do not eat food or drink liquids :After Midnight.   Brush your teeth the morning of surgery.   Do NOT smoke after Midnight   Take these medicines the morning of surgery with A SIP OF WATER: Pantoprazole                               You may not have any metal on your body including hair pins, jewelry, and body piercings             Do not wear make-up, lotions, powders, perfumes/cologne, or deodorant             Do not wear nail polish.  Do not shave  48 hours prior to surgery.                Do not bring valuables to the hospital. [place].   Contacts, dentures or bridgework may not be worn into surgery.   Leave suitcase in the car. After surgery it may be brought to your room.    Special Instructions: Bring a copy of your healthcare power of attorney and living will documents         the day of surgery if you haven't scanned them in before.              Please read over the following fact sheets you were given:  [facility] - Preparing for Surgery Before surgery, you can play an important role.  Because skin is not sterile, your skin needs to be as free of germs as possible.  You can reduce the number of germs on your skin by washing with CHG (chlorahexidine gluconate) soap before surgery.  CHG is an antiseptic cleaner which kills germs and bonds with the skin to continue killing germs even after washing. Please DO NOT use if you have an allergy to CHG or antibacterial soaps.  If your skin becomes reddened/irritated stop using the CHG and inform your nurse when you arrive at Short Stay. Do not shave (including legs and underarms) for at least 48 hours prior to the first CHG shower.   You may shave your face/neck.  Please follow these instructions carefully:  1.  Shower with CHG Soap the night before surgery and the  morning of surgery.  2.  If you choose to wash your hair, wash your hair first as usual with your normal  shampoo.  3.  After you shampoo, rinse your hair and body thoroughly to remove the shampoo.                             4.  Use CHG as you would any other liquid soap.  You can apply chg directly to the skin and wash.  Gently with a scrungie or clean washcloth.  5.  Apply the CHG Soap to your body ONLY FROM THE NECK DOWN.   Do   not use on face/ open  Wound or open sores. Avoid contact with eyes, ears mouth and   genitals (private parts).                       Wash face,  Genitals (private parts) with your normal soap.             6.  Wash thoroughly, paying special attention to the area where your    surgery  will be performed.  7.  Thoroughly rinse your body with warm water from the neck down.  8.  DO NOT shower/wash with your normal soap after using and rinsing off the CHG Soap.                9.  Pat yourself dry with a clean towel.            10.  Wear clean pajamas.            11.  Place clean sheets on your bed the night of your first shower and do not  sleep with pets. Day of Surgery : Do not apply any lotions/deodorants the morning of surgery.  Please wear clean clothes to the hospital/surgery center.  FAILURE TO FOLLOW THESE INSTRUCTIONS MAY RESULT IN THE CANCELLATION OF YOUR SURGERY  PATIENT SIGNATURE_________________________________  NURSE SIGNATURE__________________________________  ________________________________________________________________________

## 2018-07-29 ENCOUNTER — Other Ambulatory Visit: Payer: Self-pay | Admitting: Rheumatology

## 2018-07-29 NOTE — Telephone Encounter (Signed)
Last Visit: 06/11/18 Next Visit: 11/12/18 Labs: 06/11/18 CBC WNL. Calcium elevated Eye exam:  11/27/17 WNL  Okay to refill per Dr. Estanislado Pandy

## 2018-07-30 ENCOUNTER — Encounter (HOSPITAL_COMMUNITY): Payer: Self-pay

## 2018-07-30 ENCOUNTER — Other Ambulatory Visit: Payer: Self-pay

## 2018-07-30 ENCOUNTER — Encounter (HOSPITAL_COMMUNITY)
Admission: RE | Admit: 2018-07-30 | Discharge: 2018-07-30 | Disposition: A | Discharge status: Home / Self Care | Payer: Medicare HMO | Source: Ambulatory Visit | Attending: Orthopedic Surgery | Admitting: Orthopedic Surgery

## 2018-07-30 DIAGNOSIS — Z01818 Encounter for other preprocedural examination: Secondary | ICD-10-CM | POA: Insufficient documentation

## 2018-07-30 DIAGNOSIS — M1711 Unilateral primary osteoarthritis, right knee: Secondary | ICD-10-CM | POA: Insufficient documentation

## 2018-07-30 DIAGNOSIS — R9431 Abnormal electrocardiogram [ECG] [EKG]: Secondary | ICD-10-CM | POA: Diagnosis not present

## 2018-07-30 HISTORY — DX: Unspecified acquired deformity of left lower leg: M21.962

## 2018-07-30 HISTORY — DX: Carpal tunnel syndrome, bilateral upper limbs: G56.03

## 2018-07-30 HISTORY — DX: Unspecified acquired deformity of left lower leg: M21.961

## 2018-07-30 HISTORY — DX: Gastro-esophageal reflux disease without esophagitis: K21.9

## 2018-07-30 HISTORY — DX: Rheumatoid arthritis, unspecified: M06.9

## 2018-07-30 HISTORY — DX: Unspecified urinary incontinence: R32

## 2018-07-30 HISTORY — DX: Unspecified osteoarthritis, unspecified site: M19.90

## 2018-07-30 LAB — CBC WITH DIFFERENTIAL/PLATELET
ABS IMMATURE GRANULOCYTES: 0.01 10*3/uL (ref 0.00–0.07)
Basophils Absolute: 0.1 10*3/uL (ref 0.0–0.1)
Basophils Relative: 1 %
Eosinophils Absolute: 0.4 10*3/uL (ref 0.0–0.5)
Eosinophils Relative: 8 %
HEMATOCRIT: 42.5 % (ref 36.0–46.0)
Hemoglobin: 14 g/dL (ref 12.0–15.0)
Immature Granulocytes: 0 %
LYMPHS ABS: 1.8 10*3/uL (ref 0.7–4.0)
LYMPHS PCT: 32 %
MCH: 32.7 pg (ref 26.0–34.0)
MCHC: 32.9 g/dL (ref 30.0–36.0)
MCV: 99.3 fL (ref 80.0–100.0)
Monocytes Absolute: 0.6 10*3/uL (ref 0.1–1.0)
Monocytes Relative: 11 %
NEUTROS ABS: 2.8 10*3/uL (ref 1.7–7.7)
Neutrophils Relative %: 48 %
Platelets: 234 10*3/uL (ref 150–400)
RBC: 4.28 MIL/uL (ref 3.87–5.11)
RDW: 11.8 % (ref 11.5–15.5)
WBC: 5.7 10*3/uL (ref 4.0–10.5)
nRBC: 0 % (ref 0.0–0.2)

## 2018-07-30 LAB — COMPREHENSIVE METABOLIC PANEL
ALT: 18 U/L (ref 0–44)
AST: 24 U/L (ref 15–41)
Albumin: 4.3 g/dL (ref 3.5–5.0)
Alkaline Phosphatase: 66 U/L (ref 38–126)
Anion gap: 9 (ref 5–15)
BUN: 18 mg/dL (ref 8–23)
CO2: 27 mmol/L (ref 22–32)
Calcium: 10.1 mg/dL (ref 8.9–10.3)
Chloride: 103 mmol/L (ref 98–111)
Creatinine, Ser: 0.89 mg/dL (ref 0.44–1.00)
GFR calc Af Amer: >60 mL/min (ref >60)
GFR calc non Af Amer: >60 mL/min (ref >60)
GLUCOSE: 89 mg/dL (ref 70–99)
Potassium: 3.6 mmol/L (ref 3.5–5.1)
Sodium: 139 mmol/L (ref 135–145)
Total Bilirubin: 0.9 mg/dL (ref 0.3–1.2)
Total Protein: 7.2 g/dL (ref 6.5–8.1)

## 2018-07-30 LAB — SURGICAL PCR SCREEN
MRSA, PCR: NEGATIVE
Staphylococcus aureus: NEGATIVE

## 2018-08-04 ENCOUNTER — Encounter (HOSPITAL_COMMUNITY): Payer: Self-pay | Admitting: Certified Registered Nurse Anesthetist

## 2018-08-04 MED ORDER — BUPIVACAINE LIPOSOME 1.3 % IJ SUSP
20.0000 mL | INTRAMUSCULAR | Status: DC
  Filled 2018-08-04: qty 20

## 2018-08-04 NOTE — Anesthesia Preprocedure Evaluation (Addendum)
Anesthesia Evaluation  Patient identified by MRN, date of birth, ID band Patient awake    Reviewed: Allergy & Precautions, NPO status , Patient's Chart, lab work & pertinent test results  Airway Mallampati: II  TM Distance: >3 FB Neck ROM: Full    Dental  (+) Edentulous Upper,    Pulmonary neg pulmonary ROS, former smoker,    breath sounds clear to auscultation       Cardiovascular hypertension, Pt. on medications negative cardio ROS   Rhythm:Regular Rate:Normal     Neuro/Psych negative neurological ROS  negative psych ROS   GI/Hepatic Neg liver ROS, GERD  Medicated,  Endo/Other  negative endocrine ROS  Renal/GU negative Renal ROS  negative genitourinary   Musculoskeletal  (+) Arthritis , Osteoarthritis and Rheumatoid disorders,  On plaquenil    Abdominal   Peds  Hematology negative hematology ROS (+)   Anesthesia Other Findings   Reproductive/Obstetrics                            Anesthesia Physical Anesthesia Plan  ASA: II  Anesthesia Plan: Spinal and Regional   Post-op Pain Management:  Regional for Post-op pain   Induction: Intravenous  PONV Risk Score and Plan: 2 and Treatment may vary due to age or medical condition and Propofol infusion  Airway Management Planned: Natural Airway  Additional Equipment:   Intra-op Plan:   Post-operative Plan:   Informed Consent: I have reviewed the patients History and Physical, chart, labs and discussed the procedure including the risks, benefits and alternatives for the proposed anesthesia with the patient or authorized representative who has indicated his/her understanding and acceptance.   Dental advisory given  Plan Discussed with: CRNA  Anesthesia Plan Comments:         Anesthesia Quick Evaluation

## 2018-08-05 ENCOUNTER — Ambulatory Visit (HOSPITAL_COMMUNITY): Payer: Medicare HMO | Admitting: Certified Registered Nurse Anesthetist

## 2018-08-05 ENCOUNTER — Other Ambulatory Visit: Payer: Self-pay

## 2018-08-05 ENCOUNTER — Observation Stay (HOSPITAL_COMMUNITY)
Admission: RE | Admit: 2018-08-05 | Discharge: 2018-08-06 | Disposition: A | Discharge status: Home / Self Care | Payer: Medicare HMO | Source: Other Acute Inpatient Hospital | Attending: Orthopedic Surgery | Admitting: Orthopedic Surgery

## 2018-08-05 ENCOUNTER — Encounter (HOSPITAL_COMMUNITY): Payer: Self-pay | Admitting: Emergency Medicine

## 2018-08-05 ENCOUNTER — Encounter (HOSPITAL_COMMUNITY)
Admission: RE | Disposition: A | Discharge status: Home / Self Care | Payer: Self-pay | Source: Other Acute Inpatient Hospital | Attending: Orthopedic Surgery

## 2018-08-05 DIAGNOSIS — E119 Type 2 diabetes mellitus without complications: Secondary | ICD-10-CM | POA: Diagnosis not present

## 2018-08-05 DIAGNOSIS — I10 Essential (primary) hypertension: Secondary | ICD-10-CM | POA: Insufficient documentation

## 2018-08-05 DIAGNOSIS — Z90710 Acquired absence of both cervix and uterus: Secondary | ICD-10-CM | POA: Diagnosis not present

## 2018-08-05 DIAGNOSIS — Z823 Family history of stroke: Secondary | ICD-10-CM | POA: Insufficient documentation

## 2018-08-05 DIAGNOSIS — G5603 Carpal tunnel syndrome, bilateral upper limbs: Secondary | ICD-10-CM | POA: Insufficient documentation

## 2018-08-05 DIAGNOSIS — Z9842 Cataract extraction status, left eye: Secondary | ICD-10-CM | POA: Diagnosis not present

## 2018-08-05 DIAGNOSIS — Z8349 Family history of other endocrine, nutritional and metabolic diseases: Secondary | ICD-10-CM | POA: Insufficient documentation

## 2018-08-05 DIAGNOSIS — M1711 Unilateral primary osteoarthritis, right knee: Principal | ICD-10-CM | POA: Insufficient documentation

## 2018-08-05 DIAGNOSIS — M19042 Primary osteoarthritis, left hand: Secondary | ICD-10-CM | POA: Diagnosis not present

## 2018-08-05 DIAGNOSIS — Z833 Family history of diabetes mellitus: Secondary | ICD-10-CM | POA: Insufficient documentation

## 2018-08-05 DIAGNOSIS — M19041 Primary osteoarthritis, right hand: Secondary | ICD-10-CM | POA: Diagnosis not present

## 2018-08-05 DIAGNOSIS — Z79899 Other long term (current) drug therapy: Secondary | ICD-10-CM | POA: Diagnosis not present

## 2018-08-05 DIAGNOSIS — Z820 Family history of epilepsy and other diseases of the nervous system: Secondary | ICD-10-CM | POA: Insufficient documentation

## 2018-08-05 DIAGNOSIS — I219 Acute myocardial infarction, unspecified: Secondary | ICD-10-CM | POA: Diagnosis not present

## 2018-08-05 DIAGNOSIS — Z888 Allergy status to other drugs, medicaments and biological substances status: Secondary | ICD-10-CM | POA: Diagnosis not present

## 2018-08-05 DIAGNOSIS — M069 Rheumatoid arthritis, unspecified: Secondary | ICD-10-CM | POA: Diagnosis not present

## 2018-08-05 DIAGNOSIS — G8918 Other acute postprocedural pain: Secondary | ICD-10-CM | POA: Diagnosis not present

## 2018-08-05 DIAGNOSIS — E785 Hyperlipidemia, unspecified: Secondary | ICD-10-CM | POA: Insufficient documentation

## 2018-08-05 DIAGNOSIS — M19071 Primary osteoarthritis, right ankle and foot: Secondary | ICD-10-CM | POA: Insufficient documentation

## 2018-08-05 DIAGNOSIS — Z86010 Personal history of colonic polyps: Secondary | ICD-10-CM | POA: Diagnosis not present

## 2018-08-05 DIAGNOSIS — M19072 Primary osteoarthritis, left ankle and foot: Secondary | ICD-10-CM | POA: Insufficient documentation

## 2018-08-05 DIAGNOSIS — Z7982 Long term (current) use of aspirin: Secondary | ICD-10-CM | POA: Diagnosis not present

## 2018-08-05 DIAGNOSIS — I110 Hypertensive heart disease with heart failure: Secondary | ICD-10-CM | POA: Insufficient documentation

## 2018-08-05 DIAGNOSIS — D649 Anemia, unspecified: Secondary | ICD-10-CM | POA: Diagnosis not present

## 2018-08-05 DIAGNOSIS — Z87891 Personal history of nicotine dependence: Secondary | ICD-10-CM | POA: Diagnosis not present

## 2018-08-05 DIAGNOSIS — K219 Gastro-esophageal reflux disease without esophagitis: Secondary | ICD-10-CM | POA: Insufficient documentation

## 2018-08-05 DIAGNOSIS — Z9841 Cataract extraction status, right eye: Secondary | ICD-10-CM | POA: Diagnosis not present

## 2018-08-05 DIAGNOSIS — Z96659 Presence of unspecified artificial knee joint: Secondary | ICD-10-CM

## 2018-08-05 HISTORY — PX: TOTAL KNEE ARTHROPLASTY: SHX125

## 2018-08-05 SURGERY — ARTHROPLASTY, KNEE, TOTAL
Anesthesia: Regional | Site: Knee | Laterality: Right

## 2018-08-05 MED ORDER — TRANEXAMIC ACID-NACL 1000-0.7 MG/100ML-% IV SOLN
1000.0000 mg | INTRAVENOUS | Status: AC
  Administered 2018-08-05: 1000 mg via INTRAVENOUS
  Filled 2018-08-05: qty 100

## 2018-08-05 MED ORDER — BUPIVACAINE LIPOSOME 1.3 % IJ SUSP
INTRAMUSCULAR | Status: DC | PRN
  Administered 2018-08-05: 20 mL

## 2018-08-05 MED ORDER — BUPIVACAINE HCL (PF) 0.75 % IJ SOLN
INTRAMUSCULAR | Status: DC | PRN
  Administered 2018-08-05: 1.4 mL via INTRATHECAL

## 2018-08-05 MED ORDER — MIDAZOLAM HCL 5 MG/5ML IJ SOLN
INTRAMUSCULAR | Status: DC | PRN
  Administered 2018-08-05 (×2): 1 mg via INTRAVENOUS

## 2018-08-05 MED ORDER — BUPIVACAINE-EPINEPHRINE (PF) 0.5% -1:200000 IJ SOLN
INTRAMUSCULAR | Status: DC | PRN
  Administered 2018-08-05: 20 mL via PERINEURAL

## 2018-08-05 MED ORDER — LACTATED RINGERS IV SOLN
INTRAVENOUS | Status: DC
  Administered 2018-08-05 (×2): via INTRAVENOUS

## 2018-08-05 MED ORDER — SENNOSIDES-DOCUSATE SODIUM 8.6-50 MG PO TABS: 1.0000 | ORAL_TABLET | Freq: Every evening | ORAL | Status: DC | PRN

## 2018-08-05 MED ORDER — PROPOFOL 500 MG/50ML IV EMUL
INTRAVENOUS | Status: DC | PRN
  Administered 2018-08-05: 50 ug/kg/min via INTRAVENOUS

## 2018-08-05 MED ORDER — LIP MEDEX EX OINT
TOPICAL_OINTMENT | CUTANEOUS | Status: AC
  Administered 2018-08-05: 12:00:00
  Filled 2018-08-05: qty 7

## 2018-08-05 MED ORDER — ONDANSETRON HCL 4 MG/2ML IJ SOLN: 4.0000 mg | Freq: Four times a day (QID) | INTRAMUSCULAR | Status: DC | PRN

## 2018-08-05 MED ORDER — HYDROMORPHONE HCL 1 MG/ML IJ SOLN: 0.5000 mg | INTRAMUSCULAR | Status: DC | PRN

## 2018-08-05 MED ORDER — ZOLPIDEM TARTRATE 5 MG PO TABS: 5.0000 mg | ORAL_TABLET | Freq: Every evening | ORAL | Status: DC | PRN

## 2018-08-05 MED ORDER — MENTHOL 3 MG MT LOZG: 1.0000 | LOZENGE | OROMUCOSAL | Status: DC | PRN

## 2018-08-05 MED ORDER — PANTOPRAZOLE SODIUM 40 MG PO TBEC
40.0000 mg | DELAYED_RELEASE_TABLET | Freq: Every day | ORAL | Status: DC
  Administered 2018-08-06: 40 mg via ORAL
  Filled 2018-08-05: qty 1

## 2018-08-05 MED ORDER — DEXAMETHASONE SODIUM PHOSPHATE 10 MG/ML IJ SOLN
8.0000 mg | Freq: Once | INTRAMUSCULAR | Status: AC
  Administered 2018-08-05: 8 mg via INTRAVENOUS

## 2018-08-05 MED ORDER — SODIUM CHLORIDE (PF) 0.9 % IJ SOLN
INTRAMUSCULAR | Status: AC
  Filled 2018-08-05: qty 50

## 2018-08-05 MED ORDER — DOCUSATE SODIUM 100 MG PO CAPS
100.0000 mg | ORAL_CAPSULE | Freq: Two times a day (BID) | ORAL | Status: DC
  Administered 2018-08-05 – 2018-08-06 (×2): 100 mg via ORAL
  Filled 2018-08-05 (×2): qty 1

## 2018-08-05 MED ORDER — CHLORHEXIDINE GLUCONATE 4 % EX LIQD: 60.0000 mL | Freq: Once | CUTANEOUS | Status: DC

## 2018-08-05 MED ORDER — AMLODIPINE BESYLATE 10 MG PO TABS
10.0000 mg | ORAL_TABLET | Freq: Every evening | ORAL | Status: DC
  Administered 2018-08-05: 10 mg via ORAL
  Filled 2018-08-05: qty 1

## 2018-08-05 MED ORDER — FLEET ENEMA 7-19 GM/118ML RE ENEM: 1.0000 | ENEMA | Freq: Once | RECTAL | Status: DC | PRN

## 2018-08-05 MED ORDER — CEFAZOLIN SODIUM-DEXTROSE 2-4 GM/100ML-% IV SOLN
2.0000 g | Freq: Four times a day (QID) | INTRAVENOUS | Status: AC
  Administered 2018-08-05 (×2): 2 g via INTRAVENOUS
  Filled 2018-08-05 (×2): qty 100

## 2018-08-05 MED ORDER — FENTANYL CITRATE (PF) 100 MCG/2ML IJ SOLN: 25.0000 ug | INTRAMUSCULAR | Status: DC | PRN

## 2018-08-05 MED ORDER — PROPOFOL 10 MG/ML IV BOLUS
INTRAVENOUS | Status: DC | PRN
  Administered 2018-08-05: 20 mg via INTRAVENOUS

## 2018-08-05 MED ORDER — HYDROXYCHLOROQUINE SULFATE 200 MG PO TABS
200.0000 mg | ORAL_TABLET | Freq: Two times a day (BID) | ORAL | Status: DC
  Administered 2018-08-05 – 2018-08-06 (×2): 200 mg via ORAL
  Filled 2018-08-05 (×3): qty 1

## 2018-08-05 MED ORDER — SODIUM CHLORIDE 0.9 % IV SOLN
INTRAVENOUS | Status: DC
  Administered 2018-08-05 – 2018-08-06 (×2): via INTRAVENOUS

## 2018-08-05 MED ORDER — DIPHENHYDRAMINE HCL 12.5 MG/5ML PO ELIX: 12.5000 mg | ORAL_SOLUTION | ORAL | Status: DC | PRN

## 2018-08-05 MED ORDER — METHOCARBAMOL 500 MG IVPB - SIMPLE MED
500.0000 mg | Freq: Four times a day (QID) | INTRAVENOUS | Status: DC | PRN
  Filled 2018-08-05: qty 50

## 2018-08-05 MED ORDER — PANTOPRAZOLE SODIUM 40 MG PO TBEC: 40.0000 mg | DELAYED_RELEASE_TABLET | Freq: Every day | ORAL | Status: DC

## 2018-08-05 MED ORDER — MIDAZOLAM HCL 2 MG/2ML IJ SOLN: 1.0000 mg | INTRAMUSCULAR | Status: DC

## 2018-08-05 MED ORDER — MIDAZOLAM HCL 2 MG/2ML IJ SOLN
INTRAMUSCULAR | Status: AC
  Filled 2018-08-05: qty 2

## 2018-08-05 MED ORDER — BUPIVACAINE-EPINEPHRINE (PF) 0.25% -1:200000 IJ SOLN
INTRAMUSCULAR | Status: DC | PRN
  Administered 2018-08-05: 20 mL

## 2018-08-05 MED ORDER — CEFAZOLIN SODIUM-DEXTROSE 2-4 GM/100ML-% IV SOLN
2.0000 g | INTRAVENOUS | Status: AC
  Administered 2018-08-05: 2 g via INTRAVENOUS
  Filled 2018-08-05: qty 100

## 2018-08-05 MED ORDER — METHOCARBAMOL 500 MG PO TABS
500.0000 mg | ORAL_TABLET | Freq: Four times a day (QID) | ORAL | Status: DC | PRN
  Administered 2018-08-05 – 2018-08-06 (×3): 500 mg via ORAL
  Filled 2018-08-05 (×3): qty 1

## 2018-08-05 MED ORDER — ASPIRIN EC 325 MG PO TBEC
325.0000 mg | DELAYED_RELEASE_TABLET | Freq: Two times a day (BID) | ORAL | Status: DC
  Administered 2018-08-06: 325 mg via ORAL
  Filled 2018-08-05: qty 1

## 2018-08-05 MED ORDER — ONDANSETRON HCL 4 MG/2ML IJ SOLN
INTRAMUSCULAR | Status: AC
  Filled 2018-08-05: qty 2

## 2018-08-05 MED ORDER — TRANEXAMIC ACID-NACL 1000-0.7 MG/100ML-% IV SOLN
1000.0000 mg | Freq: Once | INTRAVENOUS | Status: AC
  Administered 2018-08-05: 1000 mg via INTRAVENOUS
  Filled 2018-08-05: qty 100

## 2018-08-05 MED ORDER — ONDANSETRON HCL 4 MG PO TABS
4.0000 mg | ORAL_TABLET | Freq: Four times a day (QID) | ORAL | Status: DC | PRN
  Filled 2018-08-05: qty 1

## 2018-08-05 MED ORDER — PROPOFOL 10 MG/ML IV BOLUS
INTRAVENOUS | Status: AC
  Filled 2018-08-05: qty 60

## 2018-08-05 MED ORDER — DEXAMETHASONE SODIUM PHOSPHATE 10 MG/ML IJ SOLN
10.0000 mg | Freq: Once | INTRAMUSCULAR | Status: AC
  Administered 2018-08-06: 10 mg via INTRAVENOUS
  Filled 2018-08-05: qty 1

## 2018-08-05 MED ORDER — FENTANYL CITRATE (PF) 100 MCG/2ML IJ SOLN
50.0000 ug | INTRAMUSCULAR | Status: DC
  Administered 2018-08-05: 50 ug via INTRAVENOUS

## 2018-08-05 MED ORDER — OXYCODONE HCL 5 MG PO TABS
5.0000 mg | ORAL_TABLET | ORAL | Status: DC | PRN
  Administered 2018-08-05 – 2018-08-06 (×2): 10 mg via ORAL
  Filled 2018-08-05 (×2): qty 2

## 2018-08-05 MED ORDER — ACETAMINOPHEN 500 MG PO TABS
1000.0000 mg | ORAL_TABLET | Freq: Once | ORAL | Status: AC
  Administered 2018-08-05: 1000 mg via ORAL
  Filled 2018-08-05: qty 2

## 2018-08-05 MED ORDER — FERROUS SULFATE 325 (65 FE) MG PO TABS
325.0000 mg | ORAL_TABLET | Freq: Three times a day (TID) | ORAL | Status: DC
  Administered 2018-08-06: 325 mg via ORAL
  Filled 2018-08-05 (×2): qty 1

## 2018-08-05 MED ORDER — STERILE WATER FOR IRRIGATION IR SOLN
Status: DC | PRN
  Administered 2018-08-05: 2000 mL

## 2018-08-05 MED ORDER — FENTANYL CITRATE (PF) 100 MCG/2ML IJ SOLN
INTRAMUSCULAR | Status: AC
  Administered 2018-08-05: 50 ug via INTRAVENOUS
  Filled 2018-08-05: qty 2

## 2018-08-05 MED ORDER — BISACODYL 5 MG PO TBEC: 5.0000 mg | DELAYED_RELEASE_TABLET | Freq: Every day | ORAL | Status: DC | PRN

## 2018-08-05 MED ORDER — HYDROCHLOROTHIAZIDE 25 MG PO TABS
25.0000 mg | ORAL_TABLET | Freq: Every day | ORAL | Status: DC
  Administered 2018-08-05 – 2018-08-06 (×2): 25 mg via ORAL
  Filled 2018-08-05 (×3): qty 1

## 2018-08-05 MED ORDER — LIDOCAINE 2% (20 MG/ML) 5 ML SYRINGE
INTRAMUSCULAR | Status: AC
  Filled 2018-08-05: qty 5

## 2018-08-05 MED ORDER — ALUM & MAG HYDROXIDE-SIMETH 200-200-20 MG/5ML PO SUSP: 30.0000 mL | ORAL | Status: DC | PRN

## 2018-08-05 MED ORDER — METOCLOPRAMIDE HCL 5 MG PO TABS: 5.0000 mg | ORAL_TABLET | Freq: Three times a day (TID) | ORAL | Status: DC | PRN

## 2018-08-05 MED ORDER — SIMVASTATIN 20 MG PO TABS
10.0000 mg | ORAL_TABLET | Freq: Every evening | ORAL | Status: DC
  Administered 2018-08-05: 10 mg via ORAL
  Filled 2018-08-05: qty 1

## 2018-08-05 MED ORDER — BUPIVACAINE-EPINEPHRINE (PF) 0.25% -1:200000 IJ SOLN
INTRAMUSCULAR | Status: AC
  Filled 2018-08-05: qty 30

## 2018-08-05 MED ORDER — GABAPENTIN 300 MG PO CAPS
300.0000 mg | ORAL_CAPSULE | Freq: Once | ORAL | Status: AC
  Administered 2018-08-05: 300 mg via ORAL
  Filled 2018-08-05: qty 1

## 2018-08-05 MED ORDER — SODIUM CHLORIDE 0.9 % IR SOLN
Status: DC | PRN
  Administered 2018-08-05: 1000 mL

## 2018-08-05 MED ORDER — SODIUM CHLORIDE 0.9% FLUSH
INTRAVENOUS | Status: DC | PRN
  Administered 2018-08-05: 20 mL

## 2018-08-05 MED ORDER — METOCLOPRAMIDE HCL 5 MG/ML IJ SOLN: 5.0000 mg | Freq: Three times a day (TID) | INTRAMUSCULAR | Status: DC | PRN

## 2018-08-05 MED ORDER — 0.9 % SODIUM CHLORIDE (POUR BTL) OPTIME
TOPICAL | Status: DC | PRN
  Administered 2018-08-05: 1000 mL

## 2018-08-05 MED ORDER — TRAMADOL HCL 50 MG PO TABS
50.0000 mg | ORAL_TABLET | Freq: Four times a day (QID) | ORAL | Status: DC
  Administered 2018-08-05 – 2018-08-06 (×4): 50 mg via ORAL
  Filled 2018-08-05 (×5): qty 1

## 2018-08-05 MED ORDER — GABAPENTIN 300 MG PO CAPS
300.0000 mg | ORAL_CAPSULE | Freq: Three times a day (TID) | ORAL | Status: DC
  Administered 2018-08-05 – 2018-08-06 (×3): 300 mg via ORAL
  Filled 2018-08-05 (×3): qty 1

## 2018-08-05 MED ORDER — ACETAMINOPHEN 500 MG PO TABS
1000.0000 mg | ORAL_TABLET | Freq: Four times a day (QID) | ORAL | Status: AC
  Administered 2018-08-05 – 2018-08-06 (×4): 1000 mg via ORAL
  Filled 2018-08-05 (×4): qty 2

## 2018-08-05 MED ORDER — PHENOL 1.4 % MT LIQD: 1.0000 | OROMUCOSAL | Status: DC | PRN

## 2018-08-05 MED ORDER — LOSARTAN POTASSIUM 50 MG PO TABS
100.0000 mg | ORAL_TABLET | Freq: Every day | ORAL | Status: DC
  Administered 2018-08-05 – 2018-08-06 (×2): 100 mg via ORAL
  Filled 2018-08-05 (×2): qty 2

## 2018-08-05 MED ORDER — ONDANSETRON HCL 4 MG/2ML IJ SOLN
INTRAMUSCULAR | Status: DC | PRN
  Administered 2018-08-05: 4 mg via INTRAVENOUS

## 2018-08-05 SURGICAL SUPPLY — 60 items
ARTISURF 12M PLY R 6-9CD KNEE (Knees) ×3 IMPLANT
BAG ZIPLOCK 12X15 (MISCELLANEOUS) ×3 IMPLANT
BANDAGE ACE 6X5 VEL STRL LF (GAUZE/BANDAGES/DRESSINGS) ×3 IMPLANT
BANDAGE ELASTIC 6 VELCRO ST LF (GAUZE/BANDAGES/DRESSINGS) ×3 IMPLANT
BLADE SAGITTAL 13X1.27X60 (BLADE) ×2 IMPLANT
BLADE SAGITTAL 13X1.27X60MM (BLADE) ×1
BLADE SAW SGTL 83.5X18.5 (BLADE) ×3 IMPLANT
BLADE SURG 15 STRL LF DISP TIS (BLADE) ×1 IMPLANT
BLADE SURG 15 STRL SS (BLADE) ×2
BLADE SURG SZ10 CARB STEEL (BLADE) ×6 IMPLANT
BOWL SMART MIX CTS (DISPOSABLE) ×3 IMPLANT
CEMENT BONE SIMPLEX SPEEDSET (Cement) ×6 IMPLANT
CLOSURE WOUND 1/2 X4 (GAUZE/BANDAGES/DRESSINGS) ×1
COVER SURGICAL LIGHT HANDLE (MISCELLANEOUS) ×3 IMPLANT
COVER WAND RF STERILE (DRAPES) IMPLANT
CUFF TOURN SGL QUICK 34 (TOURNIQUET CUFF) ×2
CUFF TRNQT CYL 34X4X40X1 (TOURNIQUET CUFF) ×1 IMPLANT
DECANTER SPIKE VIAL GLASS SM (MISCELLANEOUS) ×6 IMPLANT
DERMABOND ADVANCED (GAUZE/BANDAGES/DRESSINGS) ×2
DERMABOND ADVANCED .7 DNX12 (GAUZE/BANDAGES/DRESSINGS) ×1 IMPLANT
DRAPE INCISE IOBAN 66X45 STRL (DRAPES) ×6 IMPLANT
DRAPE U-SHAPE 47X51 STRL (DRAPES) ×3 IMPLANT
DRSG AQUACEL AG ADV 3.5X10 (GAUZE/BANDAGES/DRESSINGS) ×3 IMPLANT
DURAPREP 26ML APPLICATOR (WOUND CARE) ×6 IMPLANT
ELECT REM PT RETURN 15FT ADLT (MISCELLANEOUS) ×3 IMPLANT
FEMUR  CMT CCR STD SZ8 R KNEE (Knees) ×2 IMPLANT
FEMUR CMT CCR STD SZ8 R KNEE (Knees) ×1 IMPLANT
FEMUR CMTD CCR STD SZ8 R KNEE (Knees) ×1 IMPLANT
GLOVE BIOGEL M STRL SZ7.5 (GLOVE) ×3 IMPLANT
GLOVE BIOGEL PI IND STRL 7.5 (GLOVE) ×1 IMPLANT
GLOVE BIOGEL PI IND STRL 8.5 (GLOVE) ×2 IMPLANT
GLOVE BIOGEL PI INDICATOR 7.5 (GLOVE) ×2
GLOVE BIOGEL PI INDICATOR 8.5 (GLOVE) ×4
GLOVE SURG ORTHO 8.0 STRL STRW (GLOVE) ×9 IMPLANT
GOWN STRL REUS W/ TWL XL LVL3 (GOWN DISPOSABLE) ×2 IMPLANT
GOWN STRL REUS W/TWL 2XL LVL3 (GOWN DISPOSABLE) ×3 IMPLANT
GOWN STRL REUS W/TWL XL LVL3 (GOWN DISPOSABLE) ×4
HANDPIECE INTERPULSE COAX TIP (DISPOSABLE) ×2
HOLDER FOLEY CATH W/STRAP (MISCELLANEOUS) ×3 IMPLANT
HOOD PEEL AWAY FLYTE STAYCOOL (MISCELLANEOUS) ×9 IMPLANT
MANIFOLD NEPTUNE II (INSTRUMENTS) ×3 IMPLANT
NS IRRIG 1000ML POUR BTL (IV SOLUTION) ×3 IMPLANT
PACK TOTAL KNEE CUSTOM (KITS) ×3 IMPLANT
PROTECTOR NERVE ULNAR (MISCELLANEOUS) ×3 IMPLANT
SET HNDPC FAN SPRY TIP SCT (DISPOSABLE) ×1 IMPLANT
STEM POLY PAT PLY 32M KNEE (Knees) ×3 IMPLANT
STEM TIBIA 5 DEG SZ D R KNEE (Knees) ×1 IMPLANT
STRIP CLOSURE SKIN 1/2X4 (GAUZE/BANDAGES/DRESSINGS) ×2 IMPLANT
SUT BONE WAX W31G (SUTURE) ×3 IMPLANT
SUT MNCRL AB 3-0 PS2 18 (SUTURE) ×3 IMPLANT
SUT STRATAFIX 0 PDS 27 VIOLET (SUTURE) ×3
SUT STRATAFIX PDS+ 0 24IN (SUTURE) ×3 IMPLANT
SUT VIC AB 1 CT1 36 (SUTURE) ×3 IMPLANT
SUTURE STRATFX 0 PDS 27 VIOLET (SUTURE) ×1 IMPLANT
SYR CONTROL 10ML LL (SYRINGE) ×6 IMPLANT
TIBIA STEM 5 DEG SZ D R KNEE (Knees) ×3 IMPLANT
TRAY FOLEY MTR SLVR 16FR STAT (SET/KITS/TRAYS/PACK) ×3 IMPLANT
WATER STERILE IRR 1000ML POUR (IV SOLUTION) ×6 IMPLANT
WRAP KNEE MAXI GEL POST OP (GAUZE/BANDAGES/DRESSINGS) ×3 IMPLANT
YANKAUER SUCT BULB TIP 10FT TU (MISCELLANEOUS) ×3 IMPLANT

## 2018-08-05 NOTE — Anesthesia Postprocedure Evaluation (Signed)
Anesthesia Post Note  Patient: Ann Gamble  Procedure(s) Performed: TOTAL KNEE ARTHROPLASTY (Right Knee)     Patient location during evaluation: PACU Anesthesia Type: Regional and Spinal Level of consciousness: oriented and awake and alert Pain management: pain level controlled Vital Signs Assessment: post-procedure vital signs reviewed and stable Respiratory status: spontaneous breathing, respiratory function stable and patient connected to nasal cannula oxygen Cardiovascular status: blood pressure returned to baseline and stable Postop Assessment: no headache, no backache and no apparent nausea or vomiting Anesthetic complications: no    Last Vitals:  Vitals:   08/05/18 1351 08/05/18 1440  BP: 135/73 125/83  Pulse: 86 86  Resp: 16 16  Temp: 36.5 C 36.7 C  SpO2: 96% 91%    Last Pain:  Vitals:   08/05/18 1440  TempSrc: Oral  PainSc:                  [name] [name]

## 2018-08-05 NOTE — H&P (Signed)
NIGEL WESSMAN MRN:  [id number] DOB/SEX:  [birth year]/female  CHIEF COMPLAINT:  Painful right Knee  HISTORY: Patient is a 77 y.o. female presented with a history of pain in the right knee. Onset of symptoms was gradual starting a few years ago with gradually worsening course since that time. Patient has been treated conservatively with over-the-counter NSAIDs and activity modification. Patient currently rates pain in the knee at 10 out of 10 with activity. There is pain at night.  PAST MEDICAL HISTORY: Patient Active Problem List   Diagnosis Date Noted  . Rheumatoid arthritis of multiple sites with negative rheumatoid factor ([place]) 09/04/2016  . High risk medication use 09/04/2016  . Primary osteoarthritis of right knee 09/04/2016  . History of total knee replacement, left 09/04/2016  . Primary osteoarthritis of both hands 09/04/2016  . Primary osteoarthritis of both feet 09/04/2016  . Essential hypertension 09/04/2016  . Dyslipidemia 09/04/2016   Past Medical History:  Diagnosis Date  . Bilateral carpal tunnel syndrome   . Cataract immature    right  . Constipation    takes stool softener nightly  . Foot deformity, bilateral   . GERD (gastroesophageal reflux disease)   . Hx of colonic polyps   . Hyperlipidemia    takes Simvastatin daily  . Hypertension    takes Hyzaar and Amlodipine daily  . OA (osteoarthritis)   . RA (rheumatoid arthritis) ([place])   . Seasonal allergies   . Sensitive skin    pt states she has highly sensitive skin  . Urinary incontinence    Past Surgical History:  Procedure Laterality Date  . CATARACT EXTRACTION Bilateral   . COLONOSCOPY    . DENTAL SURGERY    . TONSILLECTOMY  1960  . TOTAL KNEE ARTHROPLASTY  10/30/2011   Procedure: TOTAL KNEE ARTHROPLASTY;  Surgeon: Kerin Salen, MD;  Location: [place];  Service: Orthopedics;  Laterality: Left;  DEPUY/SIGMA  . VAGINAL HYSTERECTOMY  1970's   partial     MEDICATIONS:   Medications Prior to Admission   Medication Sig Dispense Refill Last Dose  . amLODipine (NORVASC) 10 MG tablet Take 10 mg by mouth every evening.    08/04/2018 at Unknown time  . aspirin 81 MG tablet Take 81 mg by mouth daily.   07/29/2018  . Fiber POWD Take 1 Scoop by mouth daily.    08/04/2018 at Unknown time  . hydrochlorothiazide (HYDRODIURIL) 25 MG tablet Take 25 mg by mouth daily.    08/04/2018 at Unknown time  . hydroxychloroquine (PLAQUENIL) 200 MG tablet TAKE 1 TABLET BY MOUTH TWICE DAILY. 180 tablet 0 08/04/2018 at Unknown time  . losartan (COZAAR) 100 MG tablet Take 100 mg by mouth daily.    08/04/2018 at Unknown time  . MULTIPLE VITAMIN PO Take 1 tablet by mouth daily.    08/04/2018 at Unknown time  . pantoprazole (PROTONIX) 40 MG tablet Take 1 tablet (40 mg total) by mouth daily. 30 tablet 6 08/05/2018 at 0300  . simvastatin (ZOCOR) 10 MG tablet Take 10 mg by mouth every evening.    08/04/2018 at Unknown time  . sodium chloride (OCEAN) 0.65 % nasal spray Place 2 sprays into the nose as needed for congestion.    Past Week at Unknown time  . diclofenac sodium (VOLTAREN) 1 % GEL Apply 3 g topically to 3 large joints 3 times daily (Patient not taking: Reported on 01/01/2018) 3 Tube 3 More than a month at Unknown time    ALLERGIES:   Allergies  Allergen Reactions  . Lactose Intolerance (Gi) Other (See Comments)    Gas pain  . Antihistamines, Chlorpheniramine-Type Other (See Comments)    "Made her real dry in her throat, nausea"   . Other Rash    Wool and Sun Ultraviolet rays    REVIEW OF SYSTEMS:  A comprehensive review of systems was negative except for: Musculoskeletal: positive for arthralgias and bone pain   FAMILY HISTORY:   Family History  Problem Relation Age of Onset  . Thyroid disease Mother   . Alzheimer's disease Mother   . Stroke Brother   . Diabetes Son   . Anesthesia problems Neg Hx   . Hypotension Neg Hx   . Pseudochol deficiency Neg Hx   . Malignant hyperthermia Neg Hx     SOCIAL  HISTORY:   Social History   Tobacco Use  . Smoking status: Former Smoker    Packs/day: 0.50    Years: 43.00    Pack years: 21.50    Types: Cigarettes    Last attempt to quit: 08/15/2003    Years since quitting: 14.9  . Smokeless tobacco: Never Used  Substance Use Topics  . Alcohol use: Yes    Comment: 10/31/11 "beer or wine rarely; last drink ages ago"     EXAMINATION:  Vital signs in last 24 hours: Temp:  [98.2 F (36.8 C)] 98.2 F (36.8 C) (12/23 0551) Pulse Rate:  [97] 97 (12/23 0551) Resp:  [15] 15 (12/23 0551) BP: (130)/(74) 130/74 (12/23 0551) SpO2:  [95 %] 95 % (12/23 0551)  BP 130/74   Pulse 97   Temp 98.2 F (36.8 C) (Oral)   Resp 15   SpO2 95%   General Appearance:    Alert, cooperative, no distress, appears stated age  Head:    Normocephalic, without obvious abnormality, atraumatic  Eyes:    PERRL, conjunctiva/corneas clear, EOM's intact, fundi    benign, both eyes  Ears:    Normal TM's and external ear canals, both ears  Nose:   Nares normal, septum midline, mucosa normal, no drainage    or sinus tenderness  Throat:   Lips, mucosa, and tongue normal; teeth and gums normal  Neck:   Supple, symmetrical, trachea midline, no adenopathy;    thyroid:  no enlargement/tenderness/nodules; no carotid   bruit or JVD  Back:     Symmetric, no curvature, ROM normal, no CVA tenderness  Lungs:     Clear to auscultation bilaterally, respirations unlabored  Chest Wall:    No tenderness or deformity   Heart:    Regular rate and rhythm, S1 and S2 normal, no murmur, rub   or gallop  Breast Exam:    No tenderness, masses, or nipple abnormality  Abdomen:     Soft, non-tender, bowel sounds active all four quadrants,    no masses, no organomegaly  Genitalia:    Normal female without lesion, discharge or tenderness  Rectal:    Normal tone, no masses or tenderness;   guaiac negative stool  Extremities:   Extremities normal, atraumatic, no cyanosis or edema  Pulses:   2+ and  symmetric all extremities  Skin:   Skin color, texture, turgor normal, no rashes or lesions  Lymph nodes:   Cervical, supraclavicular, and axillary nodes normal  Neurologic:   CNII-XII intact, normal strength, sensation and reflexes    throughout    Musculoskeletal:  ROM 0-120, Ligaments intact,  Imaging Review Plain radiographs demonstrate severe degenerative joint disease of the right knee. The  overall alignment is neutral. The bone quality appears to be good for age and reported activity level.  Assessment/Plan: Primary osteoarthritis, right knee   The patient history, physical examination and imaging studies are consistent with advanced degenerative joint disease of the right knee. The patient has failed conservative treatment.  The clearance notes were reviewed.  After discussion with the patient it was felt that Total Knee Replacement was indicated. The procedure,  risks, and benefits of total knee arthroplasty were presented and reviewed. The risks including but not limited to aseptic loosening, infection, blood clots, vascular injury, stiffness, patella tracking problems complications among others were discussed. The patient acknowledged the explanation, agreed to proceed with the plan.  Preoperative templating of the joint replacement has been completed, documented, and submitted to the Operating Room personnel in order to optimize intra-operative equipment management.    Patient's anticipated LOS is less than 2 midnights, meeting these requirements: - Lives within 1 hour of care - Has a competent adult at home to recover with post-op recover - NO history of  - Chronic pain requiring opiods  - Diabetes  - Coronary Artery Disease  - Heart failure  - Heart attack  - Stroke  - DVT/VTE  - Cardiac arrhythmia  - Respiratory Failure/COPD  - Renal failure  - Anemia  - Advanced Liver disease       Donia Ast 08/05/2018, 6:21 AM

## 2018-08-05 NOTE — Anesthesia Procedure Notes (Signed)
Anesthesia Regional Block: Adductor canal block   Pre-Anesthetic Checklist: ,, timeout performed, Correct Patient, Correct Site, Correct Laterality, Correct Procedure, Correct Position, site marked, Risks and benefits discussed,  Surgical consent,  Pre-op evaluation,  At surgeon's request and post-op pain management  Laterality: Right  Prep: Maximum Sterile Barrier Precautions used, chloraprep       Needles:  Injection technique: Single-shot  Needle Type: Echogenic Stimulator Needle     Needle Length: 9cm  Needle Gauge: 22     Additional Needles:   Procedures:,,,, ultrasound used (permanent image in chart),,,,  Narrative:  Start time: 08/05/2018 7:47 AM End time: 08/05/2018 7:57 AM Injection made incrementally with aspirations every 5 mL.  Performed by: Personally  Anesthesiologist: Freddrick March, MD  Additional Notes: Monitors applied. No increased pain on injection. No increased resistance to injection. Injection made in 5cc increments. Good needle visualization. Patient tolerated procedure well.

## 2018-08-05 NOTE — Transfer of Care (Signed)
Immediate Anesthesia Transfer of Care Note  Patient: Ann Gamble  Procedure(s) Performed: TOTAL KNEE ARTHROPLASTY (Right Knee)  Patient Location: PACU  Anesthesia Type:Spinal  Level of Consciousness: awake, alert , oriented and patient cooperative  Airway & Oxygen Therapy: Patient Spontanous Breathing and Patient connected to nasal cannula oxygen  Post-op Assessment: Report given to RN and Post -op Vital signs reviewed and stable  Post vital signs: Reviewed and stable  Last Vitals:  Vitals Value Taken Time  BP    Temp    Pulse 88 08/05/2018 10:35 AM  Resp    SpO2 93 % 08/05/2018 10:35 AM  Vitals shown include unvalidated device data.  Last Pain:  Vitals:   08/05/18 0818  TempSrc:   PainSc: 0-No pain      Patients Stated Pain Goal: 4 (14/23/95 3202)  Complications: No apparent anesthesia complications

## 2018-08-05 NOTE — Evaluation (Signed)
Physical Therapy Evaluation Patient Details Name: Ann Gamble MRN: [id number] DOB: 10-19-1940 Today's Date: 08/05/2018   History of Present Illness  77 yo female s/p R TKR on 08/05/18. PMH includes RA, OA, L TKR, HTN, carpal tunnel, GERD, HLD.   Clinical Impression  Pt presents with R knee pain exacerbated with mobility, decreased R knee ROM, increased time and effort to perform mobility tasks, and decreased activity tolerance due to R knee pain. Pt to benefit from acute PT to address deficits. Pt ambulated 74 ft with RW with min guard assist, verbal cuing provided throughout. Pt educated on ankle pumps (20/hour) to perform this afternoon/evening to increase circulation, to pt's tolerance and limited by pain. PT to progress mobility as tolerated, and will continue to follow acutely.      Follow Up Recommendations Follow surgeon's recommendation for DC plan and follow-up therapies;Supervision for mobility/OOB(HHPT )    Equipment Recommendations  Rolling walker with 5" wheels    Recommendations for Other Services       Precautions / Restrictions Precautions Precautions: Fall Restrictions Weight Bearing Restrictions: No Other Position/Activity Restrictions: WBAT      Mobility  Bed Mobility Overal bed mobility: Needs Assistance Bed Mobility: Supine to Sit     Supine to sit: Min guard;HOB elevated     General bed mobility comments: Min guard for safety. Verbal cuing for sequencing, increased time and effort to perform.   Transfers Overall transfer level: Needs assistance Equipment used: Rolling walker (2 wheeled) Transfers: Sit to/from Stand Sit to Stand: Min guard;From elevated surface         General transfer comment: Min guard for safety. No R knee instability noted in WB. Verbal cuing for hand placement.   Ambulation/Gait Ambulation/Gait assistance: Min guard Gait Distance (Feet): 40 Feet Assistive device: Rolling walker (2 wheeled) Gait Pattern/deviations:  Step-to pattern;Decreased stance time - right;Decreased weight shift to right;Antalgic Gait velocity: decr    General Gait Details: Min guard for safety. No R knee instability/buckling noted during ambulation. Verbal cuing for placement in RW, sequencing.   Stairs            Wheelchair Mobility    Modified Rankin (Stroke Patients Only)       Balance Overall balance assessment: Mild deficits observed, not formally tested                                           Pertinent Vitals/Pain Pain Assessment: 0-10 Pain Score: 5  Pain Location: R knee  Pain Descriptors / Indicators: Sore Pain Intervention(s): Limited activity within patient's tolerance;Repositioned;Monitored during session;Ice applied    Home Living Family/patient expects to be discharged to:: Private residence Living Arrangements: Other relatives;Children(niece and nephew) Available Help at Discharge: Family;Available 24 hours/day Type of Home: Mobile home Home Access: Ramped entrance     Home Layout: One level Home Equipment: Cane - single point;Crutches;Grab bars - toilet;Walker - 4 wheels      Prior Function Level of Independence: Independent with assistive device(s)         Comments: pt reports using cane for ambulation PTA.      Hand Dominance   Dominant Hand: Right    Extremity/Trunk Assessment   Upper Extremity Assessment Upper Extremity Assessment: Overall WFL for tasks assessed    Lower Extremity Assessment Lower Extremity Assessment: Overall WFL for tasks assessed;RLE deficits/detail RLE Deficits / Details: suspected post surgical  weakness; able to perform ankle pumps, quad sets, SLR with <10* quad lag  RLE Sensation: WNL    Cervical / Trunk Assessment Cervical / Trunk Assessment: Normal  Communication   Communication: No difficulties  Cognition Arousal/Alertness: Awake/alert Behavior During Therapy: WFL for tasks assessed/performed Overall Cognitive Status:  Within Functional Limits for tasks assessed                                        General Comments      Exercises Total Joint Exercises Goniometric ROM: knee AAROM 5-80*, limited by pain    Assessment/Plan    PT Assessment Patient needs continued PT services  PT Problem List Decreased strength;Pain;Decreased range of motion;Decreased activity tolerance;Decreased knowledge of use of DME;Decreased balance;Decreased mobility       PT Treatment Interventions DME instruction;Therapeutic activities;Gait training;Therapeutic exercise;Patient/family education;Balance training;Functional mobility training    PT Goals (Current goals can be found in the Care Plan section)  Acute Rehab PT Goals Patient Stated Goal: go home, decrease R knee pain  PT Goal Formulation: With patient Time For Goal Achievement: 08/12/18 Potential to Achieve Goals: Good    Frequency 7X/week   Barriers to discharge        Co-evaluation               AM-PAC PT "6 Clicks" Mobility  Outcome Measure Help needed turning from your back to your side while in a flat bed without using bedrails?: A Little Help needed moving from lying on your back to sitting on the side of a flat bed without using bedrails?: A Little Help needed moving to and from a bed to a chair (including a wheelchair)?: A Little Help needed standing up from a chair using your arms (e.g., wheelchair or bedside chair)?: A Little Help needed to walk in hospital room?: A Little Help needed climbing 3-5 steps with a railing? : A Little 6 Click Score: 18    End of Session Equipment Utilized During Treatment: Gait belt Activity Tolerance: Patient tolerated treatment well;Patient limited by pain Patient left: in chair;with chair alarm set;with call bell/phone within reach;with family/visitor present;with SCD's reapplied Nurse Communication: Mobility status PT Visit Diagnosis: Other abnormalities of gait and mobility  (R26.89);Difficulty in walking, not elsewhere classified (R26.2)    Time: 1700-1749 PT Time Calculation (min) (ACUTE ONLY): 28 min   Charges:   PT Evaluation $PT Eval Low Complexity: 1 Low PT Treatments $Gait Training: 8-22 mins        Julien Girt, PT Acute Rehabilitation Services Pager [phone]  Office [phone]   Roxine Caddy D Elonda Husky 08/05/2018, 6:35 PM

## 2018-08-05 NOTE — Progress Notes (Signed)
Assisted Dr. Lanetta Inch with right, ultrasound guided, adductor canal block. Side rails up, monitors on throughout procedure. See vital signs in flow sheet. Tolerated Procedure well.

## 2018-08-05 NOTE — Anesthesia Procedure Notes (Signed)
Date/Time: 08/05/2018 8:35 AM Performed by: Claudia Desanctis, CRNA Pre-anesthesia Checklist: Patient identified, Emergency Drugs available, Suction available and Patient being monitored Oxygen Delivery Method: Simple face mask

## 2018-08-05 NOTE — Anesthesia Procedure Notes (Signed)
Spinal  Patient location during procedure: OR Start time: 08/05/2018 8:40 AM End time: 08/05/2018 8:50 AM Staffing Anesthesiologist: Freddrick March, MD Performed: anesthesiologist  Preanesthetic Checklist Completed: patient identified, surgical consent, pre-op evaluation, timeout performed, IV checked, risks and benefits discussed and monitors and equipment checked Spinal Block Patient position: sitting Prep: site prepped and draped and DuraPrep Patient monitoring: cardiac monitor, continuous pulse ox and blood pressure Approach: midline Location: L3-4 Injection technique: single-shot Needle Needle type: Whitacre  Needle gauge: 22 G Needle length: 9 cm Assessment Sensory level: T6 Additional Notes Functioning IV was confirmed and monitors were applied. Sterile prep and drape, including hand hygiene and sterile gloves were used. The patient was positioned and the spine was prepped. The skin was anesthetized with lidocaine.  Free flow of clear CSF was obtained prior to injecting local anesthetic into the CSF.  The spinal needle aspirated freely following injection.  The needle was carefully withdrawn.  The patient tolerated the procedure well.

## 2018-08-06 DIAGNOSIS — M19041 Primary osteoarthritis, right hand: Secondary | ICD-10-CM | POA: Diagnosis not present

## 2018-08-06 DIAGNOSIS — M069 Rheumatoid arthritis, unspecified: Secondary | ICD-10-CM | POA: Diagnosis not present

## 2018-08-06 DIAGNOSIS — M1711 Unilateral primary osteoarthritis, right knee: Secondary | ICD-10-CM | POA: Diagnosis not present

## 2018-08-06 DIAGNOSIS — K219 Gastro-esophageal reflux disease without esophagitis: Secondary | ICD-10-CM | POA: Diagnosis not present

## 2018-08-06 DIAGNOSIS — M19042 Primary osteoarthritis, left hand: Secondary | ICD-10-CM | POA: Diagnosis not present

## 2018-08-06 DIAGNOSIS — E785 Hyperlipidemia, unspecified: Secondary | ICD-10-CM | POA: Diagnosis not present

## 2018-08-06 DIAGNOSIS — M19072 Primary osteoarthritis, left ankle and foot: Secondary | ICD-10-CM | POA: Diagnosis not present

## 2018-08-06 DIAGNOSIS — Z96651 Presence of right artificial knee joint: Secondary | ICD-10-CM | POA: Diagnosis not present

## 2018-08-06 DIAGNOSIS — M19071 Primary osteoarthritis, right ankle and foot: Secondary | ICD-10-CM | POA: Diagnosis not present

## 2018-08-06 DIAGNOSIS — I10 Essential (primary) hypertension: Secondary | ICD-10-CM | POA: Diagnosis not present

## 2018-08-06 LAB — BASIC METABOLIC PANEL
ANION GAP: 8 (ref 5–15)
BUN: 16 mg/dL (ref 8–23)
CO2: 26 mmol/L (ref 22–32)
Calcium: 8.8 mg/dL — ABNORMAL LOW (ref 8.9–10.3)
Chloride: 103 mmol/L (ref 98–111)
Creatinine, Ser: 0.7 mg/dL (ref 0.44–1.00)
GFR calc Af Amer: >60 mL/min (ref >60)
GFR calc non Af Amer: >60 mL/min (ref >60)
GLUCOSE: 140 mg/dL — AB (ref 70–99)
Potassium: 4 mmol/L (ref 3.5–5.1)
Sodium: 137 mmol/L (ref 135–145)

## 2018-08-06 LAB — CBC
HCT: 38.5 % (ref 36.0–46.0)
Hemoglobin: 12.7 g/dL (ref 12.0–15.0)
MCH: 31.9 pg (ref 26.0–34.0)
MCHC: 33 g/dL (ref 30.0–36.0)
MCV: 96.7 fL (ref 80.0–100.0)
NRBC: 0 % (ref 0.0–0.2)
PLATELETS: 212 10*3/uL (ref 150–400)
RBC: 3.98 MIL/uL (ref 3.87–5.11)
RDW: 11.4 % — ABNORMAL LOW (ref 11.5–15.5)
WBC: 10.9 10*3/uL — ABNORMAL HIGH (ref 4.0–10.5)

## 2018-08-06 MED ORDER — ASPIRIN 325 MG PO TBEC: 325.0000 mg | DELAYED_RELEASE_TABLET | Freq: Two times a day (BID) | ORAL | 0 refills | Status: DC

## 2018-08-06 MED ORDER — OXYCODONE HCL 5 MG PO TABS: 5.0000 mg | ORAL_TABLET | Freq: Four times a day (QID) | ORAL | 0 refills | Status: DC | PRN

## 2018-08-06 MED ORDER — METHOCARBAMOL 500 MG PO TABS: 500.0000 mg | ORAL_TABLET | Freq: Four times a day (QID) | ORAL | 0 refills | Status: DC | PRN

## 2018-08-06 NOTE — Progress Notes (Signed)
SPORTS MEDICINE AND JOINT REPLACEMENT  Lara Mulch, MD    Carlyon Shadow, PA-C [address], [place], [place]  [address]                             [phone]   PROGRESS NOTE  Subjective:  negative for Chest Pain  negative for Shortness of Breath  negative for Nausea/Vomiting   negative for Calf Pain  negative for Bowel Movement   Tolerating Diet: yes         Patient reports pain as 3 on 0-10 scale.    Objective: Vital signs in last 24 hours:    Patient Vitals for the past 24 hrs:  BP Temp Temp src Pulse Resp SpO2  08/06/18 0646 136/80 97.8 F (36.6 C) Oral 84 12 100 %  08/06/18 0253 122/69 97.7 F (36.5 C) Oral 68 12 96 %  08/05/18 2208 110/70 97.8 F (36.6 C) Oral 77 14 96 %  08/05/18 1838 114/66 97.8 F (36.6 C) Oral 77 16 100 %  08/05/18 1440 125/83 98.1 F (36.7 C) Oral 86 16 91 %  08/05/18 1351 135/73 97.7 F (36.5 C) Oral 86 16 96 %  08/05/18 1242 134/68 97.7 F (36.5 C) Oral 72 16 96 %  08/05/18 1145 118/65 97.9 F (36.6 C) Oral 70 16 93 %  08/05/18 1115 110/67 98 F (36.7 C) - 75 17 93 %  08/05/18 1100 121/67 - - 76 15 92 %  08/05/18 1045 115/69 - - 81 16 98 %  08/05/18 1035 117/62 97.8 F (36.6 C) - 88 14 93 %  08/05/18 0818 114/60 - - 80 15 98 %  08/05/18 0813 130/61 - - 85 14 99 %  08/05/18 0808 (!) 121/59 - - 83 14 97 %  08/05/18 0803 125/62 - - 84 15 99 %  08/05/18 0802 - - - 85 14 100 %  08/05/18 0801 - - - 82 13 99 %  08/05/18 0800 - - - 83 11 97 %  08/05/18 0759 - - - 87 12 99 %  08/05/18 0758 99/87 - - 91 14 98 %  08/05/18 0757 - - - 84 12 98 %  08/05/18 0756 - - - 85 12 97 %  08/05/18 0755 - - - 86 14 98 %  08/05/18 0754 - - - 84 17 97 %  08/05/18 0753 135/72 - - 86 18 99 %  08/05/18 0752 - - - 89 18 99 %    @flow {1959:LAST@   Intake/Output from previous day:   12/23 0701 - 12/24 0700 In: 2564 [P.O.:420; I.V.:1744] Out: 1215 [Urine:1165]   Intake/Output this shift:   12/23 1901 - 12/24 0700 In: 180 [P.O.:180] Out:  800 [Urine:800]   Intake/Output      12/23 0701 - 12/24 0700   P.O. 420   I.V. (mL/kg) 1744 (24)   IV Piggyback 400   Total Intake(mL/kg) 2564 (35.3)   Urine (mL/kg/hr) 1165 (0.7)   Blood 50   Total Output 1215   Net +1349          LABORATORY DATA: Recent Labs    07/30/18 1137 08/06/18 0535  WBC 5.7 10.9*  HGB 14.0 12.7  HCT 42.5 38.5  PLT 234 212   Recent Labs    07/30/18 1137 08/06/18 0535  NA 139 137  K 3.6 4.0  CL 103 103  CO2 27 26  BUN 18 16  CREATININE  0.89 0.70  GLUCOSE 89 140*  CALCIUM 10.1 8.8*   Lab Results  Component Value Date   INR 1.33 11/02/2011   INR 1.41 11/01/2011   INR 1.22 10/31/2011    Examination:  General appearance: alert, cooperative and no distress Extremities: extremities normal, atraumatic, no cyanosis or edema  Wound Exam: clean, dry, intact   Drainage:  None: wound tissue dry  Motor Exam: Quadriceps and Hamstrings Intact  Sensory Exam: Superficial Peroneal, Deep Peroneal and Tibial normal   Assessment:    1 Day Post-Op  Procedure(s) (LRB): TOTAL KNEE ARTHROPLASTY (Right)  ADDITIONAL DIAGNOSIS:  Active Problems:   S/P total knee replacement     Plan: Physical Therapy as ordered Weight Bearing as Tolerated (WBAT)  DVT Prophylaxis:  Aspirin  DISCHARGE PLAN: Home  DISCHARGE NEEDS: HHPT       Patient's anticipated LOS is less than 2 midnights, meeting these requirements: - Lives within 1 hour of care - Has a competent adult at home to recover with post-op recover - NO history of  - Chronic pain requiring opiods  - Diabetes  - Coronary Artery Disease  - Heart failure  - Heart attack  - Stroke  - DVT/VTE  - Cardiac arrhythmia  - Respiratory Failure/COPD  - Renal failure  - Anemia  - Advanced Liver disease       Donia Ast 08/06/2018, 6:58 AM

## 2018-08-06 NOTE — Care Management Note (Signed)
Case Management Note  Patient Details  Name: Ann Gamble MRN: [id number] Date of Birth: Oct 11, 1940  Subjective/Objective:                    Action/Plan:Home with [facility], no DME needs   Expected Discharge Date:  08/06/18               Expected Discharge Plan:  [place]  In-House Referral:     Discharge planning Services  CM Consult  Post Acute Care Choice:    Choice offered to:  Patient  DME Arranged:    DME Agency:     HH Arranged:  PT [place]:  [facility] (now [facility])  Status of Service:  Completed, signed off  If discussed at [employer] of Stay Meetings, dates discussed:    Additional CommentsPurcell Mouton, RN 08/06/2018, 12:12 PM

## 2018-08-06 NOTE — Op Note (Signed)
TOTAL KNEE REPLACEMENT OPERATIVE NOTE:  08/05/2018  6:46 AM  PATIENT:  Ann Gamble  77 y.o. female  PRE-OPERATIVE DIAGNOSIS:  RT. KNEE OSTEOARTHRITS  POST-OPERATIVE DIAGNOSIS:  RT. KNEE OSTEOARTHRITS  PROCEDURE:  Procedure(s): TOTAL KNEE ARTHROPLASTY  SURGEON:  Surgeon(s): Vickey Huger, MD  PHYSICIAN ASSISTANT: Nehemiah Massed, PA-C  ANESTHESIA:   spinal  SPECIMEN: None  COUNTS:  Correct  TOURNIQUET:   Total Tourniquet Time Documented: Thigh (Right) - 38 minutes Total: Thigh (Right) - 38 minutes   DICTATION:  Indication for procedure:    The patient is a 77 y.o. female who has failed conservative treatment for RT. KNEE OSTEOARTHRITS.  Informed consent was obtained prior to anesthesia. The risks versus benefits of the operation were explain and in a way the patient can, and did, understand.   On the implant demand matching protocol, this patient scored 8.  Therefore, this patient was not receive a polyethylene insert with vitamin E which is a high demand implant.  Description of procedure:     The patient was taken to the operating room and placed under anesthesia.  The patient was positioned in the usual fashion taking care that all body parts were adequately padded and/or protected.  A tourniquet was applied and the leg prepped and draped in the usual sterile fashion.  The extremity was exsanguinated with the esmarch and tourniquet inflated to 350 mmHg.  Pre-operative range of motion was normal.  The knee was in 5 degree of mild varus.  A midline incision approximately 6-7 inches long was made with a #10 blade.  A new blade was used to make a parapatellar arthrotomy going 2-3 cm into the quadriceps tendon, over the patella, and alongside the medial aspect of the patellar tendon.  A synovectomy was then performed with the #10 blade and forceps. I then elevated the deep MCL off the medial tibial metaphysis subperiosteally around to the semimembranosus attachment.    I  everted the patella and used calipers to measure patellar thickness.  I used the reamer to ream down to appropriate thickness to recreate the native thickness.  I then removed excess bone with the rongeur and sagittal saw.  I used the appropriately sized template and drilled the three lug holes.  I then put the trial in place and measured the thickness with the calipers to ensure recreation of the native thickness.  The trial was then removed and the patella subluxed and the knee brought into flexion.  A homan retractor was place to retract and protect the patella and lateral structures.  A Z-retractor was place medially to protect the medial structures.  The extra-medullary alignment system was used to make cut the tibial articular surface perpendicular to the anamotic axis of the tibia and in 3 degrees of posterior slope.  The cut surface and alignment jig was removed.  I then used the intramedullary alignment guide to make a 6 valgus cut on the distal femur.  I then marked out the epicondylar axis on the distal femur.  The posterior condylar axis measured 3 degrees.  I then used the anterior referencing sizer and measured the femur to be a size 8.  The 4-In-1 cutting block was screwed into place in external rotation matching the posterior condylar angle, making our cuts perpendicular to the epicondylar axis.  Anterior, posterior and chamfer cuts were made with the sagittal saw.  The cutting block and cut pieces were removed.  A lamina spreader was placed in 90 degrees of flexion.  The ACL, PCL, menisci, and posterior condylar osteophytes were removed.  A 12 mm spacer blocked was found to offer good flexion and extension gap balance after mild in degree releasing.   The scoop retractor was then placed and the femoral finishing block was pinned in place.  The small sagittal saw was used as well as the lug drill to finish the femur.  The block and cut surfaces were removed and the medullary canal hole filled  with autograft bone from the cut pieces.  The tibia was delivered forward in deep flexion and external rotation.  A size D tray was selected and pinned into place centered on the medial 1/3 of the tibial tubercle.  The reamer and keel was used to prepare the tibia through the tray.    I then trialed with the size 8 femur, size D tibia, a 12 mm insert and the 32 patella.  I had excellent flexion/extension gap balance, excellent patella tracking.  Flexion was full and beyond 120 degrees; extension was zero.  These components were chosen and the staff opened them to me on the back table while the knee was lavaged copiously and the cement mixed.  The soft tissue was infiltrated with 60cc of exparel 1.3% through a 21 gauge needle.  I cemented in the components and removed all excess cement.  The polyethylene tibial component was snapped into place and the knee placed in extension while cement was hardening.  The capsule was infilltrated with a 60cc exparel/marcaine/saline mixture.   Once the cement was hard, the tourniquet was let down.  Hemostasis was obtained.  The arthrotomy was closed using a #1 stratofix running suture.  The deep soft tissues were closed with #0 vicryls and the subcuticular layer closed with #2-0 vicryl.  The skin was reapproximated and closed with 3.0 Monocryl.  The wound was covered with steristrips, aquacel dressing, and a TED stocking.   The patient was then awakened, extubated, and taken to the recovery room in stable condition.  BLOOD LOSS:  492EF COMPLICATIONS:  None.  PLAN OF CARE: Admit for overnight observation  PATIENT DISPOSITION:  PACU - hemodynamically stable.   Delay start of Pharmacological VTE agent (>24hrs) due to surgical blood loss or risk of bleeding:  not applicable  Please fax a copy of this op note to my office at [phone] (please only include page 1 and 2 of the Case Information op note)

## 2018-08-06 NOTE — Progress Notes (Signed)
Physical Therapy Treatment Patient Details Name: Ann Gamble MRN: [id number] DOB: Jul 21, 1941 Today's Date: 08/06/2018    History of Present Illness 77 yo female s/p R TKR on 08/05/18. PMH includes RA, OA, L TKR, HTN, carpal tunnel, GERD, HLD.     PT Comments    Pt  Doing well today; would benefit from intermittent supervision at home/supervision for mobility; pt is meetin goals and ready for d/c from PT standpoint  Follow Up Recommendations  Follow surgeon's recommendation for DC plan and follow-up therapies;Supervision for mobility/OOB     Equipment Recommendations  Rolling walker with 5" wheels    Recommendations for Other Services       Precautions / Restrictions Precautions Precautions: Fall Restrictions Weight Bearing Restrictions: No Other Position/Activity Restrictions: WBAT    Mobility  Bed Mobility Overal bed mobility: Needs Assistance Bed Mobility: Supine to Sit;Sit to Supine     Supine to sit: Supervision Sit to supine: Supervision   General bed mobility comments: for safety  Transfers Overall transfer level: Needs assistance Equipment used: Rolling walker (2 wheeled) Transfers: Sit to/from Stand Sit to Stand: Supervision         General transfer comment: cues for hand placement   Ambulation/Gait Ambulation/Gait assistance: Supervision Gait Distance (Feet): 160 Feet Assistive device: Rolling walker (2 wheeled) Gait Pattern/deviations: Step-through pattern     General Gait Details: cues for gait progression   Stairs             Wheelchair Mobility    Modified Rankin (Stroke Patients Only)       Balance                                            Cognition Arousal/Alertness: Awake/alert Behavior During Therapy: WFL for tasks assessed/performed Overall Cognitive Status: Within Functional Limits for tasks assessed                                        Exercises Total Joint  Exercises Ankle Circles/Pumps: AROM;Both;10 reps Quad Sets: AROM;Both;10 reps Heel Slides: AROM;AAROM;Right;10 reps Hip ABduction/ADduction: AROM;Right;10 reps Straight Leg Raises: AROM;Right;10 reps Goniometric ROM: 5* to 95*    General Comments        Pertinent Vitals/Pain Pain Assessment: 0-10 Pain Score: 2  Pain Location: R knee  Pain Descriptors / Indicators: Sore Pain Intervention(s): Limited activity within patient's tolerance;Monitored during session;Ice applied    Home Living                      Prior Function            PT Goals (current goals can now be found in the care plan section) Acute Rehab PT Goals Patient Stated Goal: go home, decrease R knee pain  PT Goal Formulation: With patient Time For Goal Achievement: 08/12/18 Potential to Achieve Goals: Good Progress towards PT goals: Progressing toward goals    Frequency    7X/week      PT Plan Current plan remains appropriate    Co-evaluation              AM-PAC PT "6 Clicks" Mobility   Outcome Measure  Help needed turning from your back to your side while in a flat bed without using bedrails?: A Little Help needed moving  from lying on your back to sitting on the side of a flat bed without using bedrails?: A Little Help needed moving to and from a bed to a chair (including a wheelchair)?: A Little Help needed standing up from a chair using your arms (e.g., wheelchair or bedside chair)?: A Little Help needed to walk in hospital room?: A Little Help needed climbing 3-5 steps with a railing? : A Little 6 Click Score: 18    End of Session Equipment Utilized During Treatment: Gait belt Activity Tolerance: Patient tolerated treatment well Patient left: with call bell/phone within reach;in bed;with bed alarm set Nurse Communication: Mobility status PT Visit Diagnosis: Other abnormalities of gait and mobility (R26.89);Difficulty in walking, not elsewhere classified (R26.2)     Time:  2103-1281 PT Time Calculation (min) (ACUTE ONLY): 26 min  Charges:  $Gait Training: 8-22 mins $Therapeutic Exercise: 8-22 mins                     Kenyon Ana, PT  Pager: [phone] Acute Rehab Dept [facility]): [phone]   08/06/2018    [facility] 08/06/2018, 11:37 AM

## 2018-08-06 NOTE — Discharge Summary (Signed)
SPORTS MEDICINE & JOINT REPLACEMENT   Lara Mulch, MD   Carlyon Shadow, PA-C [place], [place], [place]  [id number]                             [phone]  PATIENT ID: Ann Gamble        MRN:  [id number]          DOB/AGE: [birth year] / 77 y.o.    DISCHARGE SUMMARY  ADMISSION DATE:    08/05/2018 DISCHARGE DATE:   08/06/2018   ADMISSION DIAGNOSIS: RT. KNEE OSTEOARTHRITS    DISCHARGE DIAGNOSIS:  RT. KNEE OSTEOARTHRITS    ADDITIONAL DIAGNOSIS: Active Problems:   S/P total knee replacement  Past Medical History:  Diagnosis Date  . Bilateral carpal tunnel syndrome   . Cataract immature    right  . Constipation    takes stool softener nightly  . Foot deformity, bilateral   . GERD (gastroesophageal reflux disease)   . Hx of colonic polyps   . Hyperlipidemia    takes Simvastatin daily  . Hypertension    takes Hyzaar and Amlodipine daily  . OA (osteoarthritis)   . RA (rheumatoid arthritis) ([place])   . Seasonal allergies   . Sensitive skin    pt states she has highly sensitive skin  . Urinary incontinence     PROCEDURE: Procedure(s): TOTAL KNEE ARTHROPLASTY on 08/05/2018  CONSULTS:    HISTORY:  See H&P in chart  HOSPITAL COURSE:  Ann Gamble is a 77 y.o. admitted on 08/05/2018 and found to have a diagnosis of RT. KNEE OSTEOARTHRITS.  After appropriate laboratory studies were obtained  they were taken to the operating room on 08/05/2018 and underwent Procedure(s): TOTAL KNEE ARTHROPLASTY.   They were given perioperative antibiotics:  Anti-infectives (From admission, onward)   Start     Dose/Rate Route Frequency Ordered Stop   08/05/18 2200  hydroxychloroquine (PLAQUENIL) tablet 200 mg     200 mg Oral 2 times daily 08/05/18 1145     08/05/18 1430  ceFAZolin (ANCEF) IVPB 2g/100 mL premix     2 g 200 mL/hr over 30 Minutes Intravenous Every 6 hours 08/05/18 1144 08/05/18 2131   08/05/18 0600  ceFAZolin (ANCEF) IVPB 2g/100 mL premix     2 g 200  mL/hr over 30 Minutes Intravenous On call to O.R. 08/05/18 5956 08/05/18 0837    .  Patient given tranexamic acid IV or topical and exparel intra-operatively.  Tolerated the procedure well.    POD# 1: Vital signs were stable.  Patient denied Chest pain, shortness of breath, or calf pain.  Patient was started on Aspirin twice daily at 8am.  Consults to PT, OT, and care management were made.  The patient was weight bearing as tolerated.  CPM was placed on the operative leg 0-90 degrees for 6-8 hours a day. When out of the CPM, patient was placed in the foam block to achieve full extension. Incentive spirometry was taught.  Dressing was changed.       POD #2, Continued  PT for ambulation and exercise program.  IV saline locked.  O2 discontinued.    The remainder of the hospital course was dedicated to ambulation and strengthening.   The patient was discharged on 1 Day Post-Op in  Good condition.  Blood products given:none  DIAGNOSTIC STUDIES: Recent vital signs:  Patient Vitals for the past 24 hrs:  BP Temp Temp src Pulse Resp SpO2  08/06/18 0646 136/80 97.8 F (36.6 C) Oral 84 12 100 %  08/06/18 0253 122/69 97.7 F (36.5 C) Oral 68 12 96 %  08/05/18 2208 110/70 97.8 F (36.6 C) Oral 77 14 96 %  08/05/18 1838 114/66 97.8 F (36.6 C) Oral 77 16 100 %  08/05/18 1440 125/83 98.1 F (36.7 C) Oral 86 16 91 %  08/05/18 1351 135/73 97.7 F (36.5 C) Oral 86 16 96 %  08/05/18 1242 134/68 97.7 F (36.5 C) Oral 72 16 96 %  08/05/18 1145 118/65 97.9 F (36.6 C) Oral 70 16 93 %  08/05/18 1115 110/67 98 F (36.7 C) - 75 17 93 %  08/05/18 1100 121/67 - - 76 15 92 %  08/05/18 1045 115/69 - - 81 16 98 %  08/05/18 1035 117/62 97.8 F (36.6 C) - 88 14 93 %  08/05/18 0818 114/60 - - 80 15 98 %  08/05/18 0813 130/61 - - 85 14 99 %  08/05/18 0808 (!) 121/59 - - 83 14 97 %  08/05/18 0803 125/62 - - 84 15 99 %  08/05/18 0802 - - - 85 14 100 %  08/05/18 0801 - - - 82 13 99 %  08/05/18 0800 - -  - 83 11 97 %  08/05/18 0759 - - - 87 12 99 %  08/05/18 0758 99/87 - - 91 14 98 %  08/05/18 0757 - - - 84 12 98 %  08/05/18 0756 - - - 85 12 97 %  08/05/18 0755 - - - 86 14 98 %  08/05/18 0754 - - - 84 17 97 %  08/05/18 0753 135/72 - - 86 18 99 %  08/05/18 0752 - - - 89 18 99 %       Recent laboratory studies: Recent Labs    07/30/18 1137 08/06/18 0535  WBC 5.7 10.9*  HGB 14.0 12.7  HCT 42.5 38.5  PLT 234 212   Recent Labs    07/30/18 1137 08/06/18 0535  NA 139 137  K 3.6 4.0  CL 103 103  CO2 27 26  BUN 18 16  CREATININE 0.89 0.70  GLUCOSE 89 140*  CALCIUM 10.1 8.8*   Lab Results  Component Value Date   INR 1.33 11/02/2011   INR 1.41 11/01/2011   INR 1.22 10/31/2011     Recent Radiographic Studies :  No results found.  DISCHARGE INSTRUCTIONS: Discharge Instructions    Call MD / Call 911   Complete by:  As directed    If you experience chest pain or shortness of breath, CALL 911 and be transported to the hospital emergency room.  If you develope a fever above 101 F, pus (white drainage) or increased drainage or redness at the wound, or calf pain, call your surgeon's office.   Constipation Prevention   Complete by:  As directed    Drink plenty of fluids.  Prune juice may be helpful.  You may use a stool softener, such as Colace (over the counter) 100 mg twice a day.  Use MiraLax (over the counter) for constipation as needed.   Diet - low sodium heart healthy   Complete by:  As directed    Discharge instructions   Complete by:  As directed    INSTRUCTIONS AFTER JOINT REPLACEMENT   Remove items at home which could result in a fall. This includes throw rugs or furniture in walking pathways ICE to the affected joint every three hours while awake for  30 minutes at a time, for at least the first 3-5 days, and then as needed for pain and swelling.  Continue to use ice for pain and swelling. You may notice swelling that will progress down to the foot and ankle.  This is  normal after surgery.  Elevate your leg when you are not up walking on it.   Continue to use the breathing machine you got in the hospital (incentive spirometer) which will help keep your temperature down.  It is common for your temperature to cycle up and down following surgery, especially at night when you are not up moving around and exerting yourself.  The breathing machine keeps your lungs expanded and your temperature down.   DIET:  As you were doing prior to hospitalization, we recommend a well-balanced diet.  DRESSING / WOUND CARE / SHOWERING  Keep the surgical dressing until follow up.  The dressing is water proof, so you can shower without any extra covering.  IF THE DRESSING FALLS OFF or the wound gets wet inside, change the dressing with sterile gauze.  Please use good hand washing techniques before changing the dressing.  Do not use any lotions or creams on the incision until instructed by your surgeon.    ACTIVITY  Increase activity slowly as tolerated, but follow the weight bearing instructions below.   No driving for 6 weeks or until further direction given by your physician.  You cannot drive while taking narcotics.  No lifting or carrying greater than 10 lbs. until further directed by your surgeon. Avoid periods of inactivity such as sitting longer than an hour when not asleep. This helps prevent blood clots.  You may return to work once you are authorized by your doctor.     WEIGHT BEARING   Weight bearing as tolerated with assist device (walker, cane, etc) as directed, use it as long as suggested by your surgeon or therapist, typically at least 4-6 weeks.   EXERCISES  Results after joint replacement surgery are often greatly improved when you follow the exercise, range of motion and muscle strengthening exercises prescribed by your doctor. Safety measures are also important to protect the joint from further injury. Any time any of these exercises cause you to have  increased pain or swelling, decrease what you are doing until you are comfortable again and then slowly increase them. If you have problems or questions, call your caregiver or physical therapist for advice.   Rehabilitation is important following a joint replacement. After just a few days of immobilization, the muscles of the leg can become weakened and shrink (atrophy).  These exercises are designed to build up the tone and strength of the thigh and leg muscles and to improve motion. Often times heat used for twenty to thirty minutes before working out will loosen up your tissues and help with improving the range of motion but do not use heat for the first two weeks following surgery (sometimes heat can increase post-operative swelling).   These exercises can be done on a training (exercise) mat, on the floor, on a table or on a bed. Use whatever works the best and is most comfortable for you.    Use music or television while you are exercising so that the exercises are a pleasant break in your day. This will make your life better with the exercises acting as a break in your routine that you can look forward to.   Perform all exercises about fifteen times, three times per day  or as directed.  You should exercise both the operative leg and the other leg as well.   Exercises include:   Quad Sets - Tighten up the muscle on the front of the thigh (Quad) and hold for 5-10 seconds.   Straight Leg Raises - With your knee straight (if you were given a brace, keep it on), lift the leg to 60 degrees, hold for 3 seconds, and slowly lower the leg.  Perform this exercise against resistance later as your leg gets stronger.  Leg Slides: Lying on your back, slowly slide your foot toward your buttocks, bending your knee up off the floor (only go as far as is comfortable). Then slowly slide your foot back down until your leg is flat on the floor again.  Angel Wings: Lying on your back spread your legs to the side as far  apart as you can without causing discomfort.  Hamstring Strength:  Lying on your back, push your heel against the floor with your leg straight by tightening up the muscles of your buttocks.  Repeat, but this time bend your knee to a comfortable angle, and push your heel against the floor.  You may put a pillow under the heel to make it more comfortable if necessary.   A rehabilitation program following joint replacement surgery can speed recovery and prevent re-injury in the future due to weakened muscles. Contact your doctor or a physical therapist for more information on knee rehabilitation.    CONSTIPATION  Constipation is defined medically as fewer than three stools per week and severe constipation as less than one stool per week.  Even if you have a regular bowel pattern at home, your normal regimen is likely to be disrupted due to multiple reasons following surgery.  Combination of anesthesia, postoperative narcotics, change in appetite and fluid intake all can affect your bowels.   YOU MUST use at least one of the following options; they are listed in order of increasing strength to get the job done.  They are all available over the counter, and you may need to use some, POSSIBLY even all of these options:    Drink plenty of fluids (prune juice may be helpful) and high fiber foods Colace 100 mg by mouth twice a day  Senokot for constipation as directed and as needed Dulcolax (bisacodyl), take with full glass of water  Miralax (polyethylene glycol) once or twice a day as needed.  If you have tried all these things and are unable to have a bowel movement in the first 3-4 days after surgery call either your surgeon or your primary doctor.    If you experience loose stools or diarrhea, hold the medications until you stool forms back up.  If your symptoms do not get better within 1 week or if they get worse, check with your doctor.  If you experience "the worst abdominal pain ever" or develop  nausea or vomiting, please contact the office immediately for further recommendations for treatment.   ITCHING:  If you experience itching with your medications, try taking only a single pain pill, or even half a pain pill at a time.  You can also use Benadryl over the counter for itching or also to help with sleep.   TED HOSE STOCKINGS:  Use stockings on both legs until for at least 2 weeks or as directed by physician office. They may be removed at night for sleeping.  MEDICATIONS:  See your medication summary on the "After Visit Summary" that  nursing will review with you.  You may have some home medications which will be placed on hold until you complete the course of blood thinner medication.  It is important for you to complete the blood thinner medication as prescribed.  PRECAUTIONS:  If you experience chest pain or shortness of breath - call 911 immediately for transfer to the hospital emergency department.   If you develop a fever greater that 101 F, purulent drainage from wound, increased redness or drainage from wound, foul odor from the wound/dressing, or calf pain - CONTACT YOUR SURGEON.                                                   FOLLOW-UP APPOINTMENTS:  If you do not already have a post-op appointment, please call the office for an appointment to be seen by your surgeon.  Guidelines for how soon to be seen are listed in your "After Visit Summary", but are typically between 1-4 weeks after surgery.  OTHER INSTRUCTIONS:   Knee Replacement:  Do not place pillow under knee, focus on keeping the knee straight while resting. CPM instructions: 0-90 degrees, 2 hours in the morning, 2 hours in the afternoon, and 2 hours in the evening. Place foam block, curve side up under heel at all times except when in CPM or when walking.  DO NOT modify, tear, cut, or change the foam block in any way.  MAKE SURE YOU:  Understand these instructions.  Get help right away if you are not doing well or  get worse.    Thank you for letting us be a part of your medical care team.  It is a privilege we respect greatly.  We hope these instructions will help you stay on track for a fast and full recovery!   Increase activity slowly as tolerated   Complete by:  As directed       DISCHARGE MEDICATIONS:   Allergies as of 08/06/2018      Reactions   Lactose Intolerance (gi) Other (See Comments)   Gas pain   Antihistamines, Chlorpheniramine-type Other (See Comments)   "Made her real dry in her throat, nausea"    Other Rash   Wool and Sun Ultraviolet rays   Tape Rash      Medication List    STOP taking these medications   aspirin 81 MG tablet Replaced by:  aspirin 325 MG EC tablet   diclofenac sodium 1 % Gel Commonly known as:  VOLTAREN     TAKE these medications   amLODipine 10 MG tablet Commonly known as:  NORVASC Take 10 mg by mouth every evening.   aspirin 325 MG EC tablet Take 1 tablet (325 mg total) by mouth 2 (two) times daily. Replaces:  aspirin 81 MG tablet   Fiber Powd Take 1 Scoop by mouth daily.   hydrochlorothiazide 25 MG tablet Commonly known as:  HYDRODIURIL Take 25 mg by mouth daily.   hydroxychloroquine 200 MG tablet Commonly known as:  PLAQUENIL TAKE 1 TABLET BY MOUTH TWICE DAILY.   losartan 100 MG tablet Commonly known as:  COZAAR Take 100 mg by mouth daily.   methocarbamol 500 MG tablet Commonly known as:  ROBAXIN Take 1-2 tablets (500-1,000 mg total) by mouth every 6 (six) hours as needed for muscle spasms.   MULTIPLE VITAMIN PO Take 1 tablet  by mouth daily.   oxyCODONE 5 MG immediate release tablet Commonly known as:  Oxy IR/ROXICODONE Take 1-2 tablets (5-10 mg total) by mouth every 6 (six) hours as needed for moderate pain (pain score 4-6).   pantoprazole 40 MG tablet Commonly known as:  PROTONIX Take 1 tablet (40 mg total) by mouth daily.   simvastatin 10 MG tablet Commonly known as:  ZOCOR Take 10 mg by mouth every evening.    sodium chloride 0.65 % nasal spray Commonly known as:  OCEAN Place 2 sprays into the nose as needed for congestion.            Durable Medical Equipment  (From admission, onward)         Start     Ordered   08/05/18 1143  DME Walker rolling  Once    Question:  Patient needs a walker to treat with the following condition  Answer:  S/P total knee replacement   08/05/18 1144   08/05/18 1143  DME 3 n 1  Once     08/05/18 1144   08/05/18 1143  DME Bedside commode  Once    Question:  Patient needs a bedside commode to treat with the following condition  Answer:  S/P total knee replacement   08/05/18 1144          FOLLOW UP VISIT:    DISPOSITION: HOME VS. SNF  CONDITION:  Good   Donia Ast 08/06/2018, 7:02 AM

## 2018-08-07 DIAGNOSIS — M0609 Rheumatoid arthritis without rheumatoid factor, multiple sites: Secondary | ICD-10-CM | POA: Diagnosis not present

## 2018-08-07 DIAGNOSIS — M19042 Primary osteoarthritis, left hand: Secondary | ICD-10-CM | POA: Diagnosis not present

## 2018-08-07 DIAGNOSIS — M19041 Primary osteoarthritis, right hand: Secondary | ICD-10-CM | POA: Diagnosis not present

## 2018-08-07 DIAGNOSIS — I10 Essential (primary) hypertension: Secondary | ICD-10-CM | POA: Diagnosis not present

## 2018-08-07 DIAGNOSIS — Z471 Aftercare following joint replacement surgery: Secondary | ICD-10-CM | POA: Diagnosis not present

## 2018-08-07 DIAGNOSIS — E782 Mixed hyperlipidemia: Secondary | ICD-10-CM | POA: Diagnosis not present

## 2018-08-07 DIAGNOSIS — M1611 Unilateral primary osteoarthritis, right hip: Secondary | ICD-10-CM | POA: Diagnosis not present

## 2018-08-07 DIAGNOSIS — K5900 Constipation, unspecified: Secondary | ICD-10-CM | POA: Diagnosis not present

## 2018-08-07 DIAGNOSIS — K219 Gastro-esophageal reflux disease without esophagitis: Secondary | ICD-10-CM | POA: Diagnosis not present

## 2018-08-08 ENCOUNTER — Encounter (HOSPITAL_COMMUNITY): Payer: Self-pay | Admitting: Orthopedic Surgery

## 2018-08-08 DIAGNOSIS — K5900 Constipation, unspecified: Secondary | ICD-10-CM | POA: Diagnosis not present

## 2018-08-08 DIAGNOSIS — M19041 Primary osteoarthritis, right hand: Secondary | ICD-10-CM | POA: Diagnosis not present

## 2018-08-08 DIAGNOSIS — K219 Gastro-esophageal reflux disease without esophagitis: Secondary | ICD-10-CM | POA: Diagnosis not present

## 2018-08-08 DIAGNOSIS — Z471 Aftercare following joint replacement surgery: Secondary | ICD-10-CM | POA: Diagnosis not present

## 2018-08-08 DIAGNOSIS — I10 Essential (primary) hypertension: Secondary | ICD-10-CM | POA: Diagnosis not present

## 2018-08-08 DIAGNOSIS — M19042 Primary osteoarthritis, left hand: Secondary | ICD-10-CM | POA: Diagnosis not present

## 2018-08-08 DIAGNOSIS — M0609 Rheumatoid arthritis without rheumatoid factor, multiple sites: Secondary | ICD-10-CM | POA: Diagnosis not present

## 2018-08-08 DIAGNOSIS — M1611 Unilateral primary osteoarthritis, right hip: Secondary | ICD-10-CM | POA: Diagnosis not present

## 2018-08-08 DIAGNOSIS — E782 Mixed hyperlipidemia: Secondary | ICD-10-CM | POA: Diagnosis not present

## 2018-08-10 DIAGNOSIS — K5900 Constipation, unspecified: Secondary | ICD-10-CM | POA: Diagnosis not present

## 2018-08-10 DIAGNOSIS — K219 Gastro-esophageal reflux disease without esophagitis: Secondary | ICD-10-CM | POA: Diagnosis not present

## 2018-08-10 DIAGNOSIS — Z471 Aftercare following joint replacement surgery: Secondary | ICD-10-CM | POA: Diagnosis not present

## 2018-08-10 DIAGNOSIS — M0609 Rheumatoid arthritis without rheumatoid factor, multiple sites: Secondary | ICD-10-CM | POA: Diagnosis not present

## 2018-08-10 DIAGNOSIS — M19041 Primary osteoarthritis, right hand: Secondary | ICD-10-CM | POA: Diagnosis not present

## 2018-08-10 DIAGNOSIS — I10 Essential (primary) hypertension: Secondary | ICD-10-CM | POA: Diagnosis not present

## 2018-08-10 DIAGNOSIS — E782 Mixed hyperlipidemia: Secondary | ICD-10-CM | POA: Diagnosis not present

## 2018-08-10 DIAGNOSIS — M1611 Unilateral primary osteoarthritis, right hip: Secondary | ICD-10-CM | POA: Diagnosis not present

## 2018-08-10 DIAGNOSIS — M19042 Primary osteoarthritis, left hand: Secondary | ICD-10-CM | POA: Diagnosis not present

## 2018-08-13 DIAGNOSIS — M19042 Primary osteoarthritis, left hand: Secondary | ICD-10-CM | POA: Diagnosis not present

## 2018-08-13 DIAGNOSIS — K219 Gastro-esophageal reflux disease without esophagitis: Secondary | ICD-10-CM | POA: Diagnosis not present

## 2018-08-13 DIAGNOSIS — K5900 Constipation, unspecified: Secondary | ICD-10-CM | POA: Diagnosis not present

## 2018-08-13 DIAGNOSIS — M0609 Rheumatoid arthritis without rheumatoid factor, multiple sites: Secondary | ICD-10-CM | POA: Diagnosis not present

## 2018-08-13 DIAGNOSIS — I10 Essential (primary) hypertension: Secondary | ICD-10-CM | POA: Diagnosis not present

## 2018-08-13 DIAGNOSIS — M1611 Unilateral primary osteoarthritis, right hip: Secondary | ICD-10-CM | POA: Diagnosis not present

## 2018-08-13 DIAGNOSIS — Z471 Aftercare following joint replacement surgery: Secondary | ICD-10-CM | POA: Diagnosis not present

## 2018-08-13 DIAGNOSIS — M19041 Primary osteoarthritis, right hand: Secondary | ICD-10-CM | POA: Diagnosis not present

## 2018-08-13 DIAGNOSIS — E782 Mixed hyperlipidemia: Secondary | ICD-10-CM | POA: Diagnosis not present

## 2018-08-14 DIAGNOSIS — K219 Gastro-esophageal reflux disease without esophagitis: Secondary | ICD-10-CM | POA: Diagnosis not present

## 2018-08-14 DIAGNOSIS — I10 Essential (primary) hypertension: Secondary | ICD-10-CM | POA: Diagnosis not present

## 2018-08-14 DIAGNOSIS — M1711 Unilateral primary osteoarthritis, right knee: Secondary | ICD-10-CM | POA: Diagnosis not present

## 2018-08-14 DIAGNOSIS — M1611 Unilateral primary osteoarthritis, right hip: Secondary | ICD-10-CM | POA: Diagnosis not present

## 2018-08-14 DIAGNOSIS — E782 Mixed hyperlipidemia: Secondary | ICD-10-CM | POA: Diagnosis not present

## 2018-08-14 DIAGNOSIS — M19041 Primary osteoarthritis, right hand: Secondary | ICD-10-CM | POA: Diagnosis not present

## 2018-08-14 DIAGNOSIS — M0609 Rheumatoid arthritis without rheumatoid factor, multiple sites: Secondary | ICD-10-CM | POA: Diagnosis not present

## 2018-08-14 DIAGNOSIS — K5900 Constipation, unspecified: Secondary | ICD-10-CM | POA: Diagnosis not present

## 2018-08-14 DIAGNOSIS — M19042 Primary osteoarthritis, left hand: Secondary | ICD-10-CM | POA: Diagnosis not present

## 2018-08-14 DIAGNOSIS — Z471 Aftercare following joint replacement surgery: Secondary | ICD-10-CM | POA: Diagnosis not present

## 2018-08-14 DIAGNOSIS — Z96651 Presence of right artificial knee joint: Secondary | ICD-10-CM | POA: Diagnosis not present

## 2018-08-15 DIAGNOSIS — Z96651 Presence of right artificial knee joint: Secondary | ICD-10-CM | POA: Diagnosis not present

## 2018-08-15 DIAGNOSIS — Z471 Aftercare following joint replacement surgery: Secondary | ICD-10-CM | POA: Diagnosis not present

## 2018-08-16 DIAGNOSIS — M25661 Stiffness of right knee, not elsewhere classified: Secondary | ICD-10-CM | POA: Diagnosis not present

## 2018-08-16 DIAGNOSIS — M25561 Pain in right knee: Secondary | ICD-10-CM | POA: Diagnosis not present

## 2018-08-16 DIAGNOSIS — M6281 Muscle weakness (generalized): Secondary | ICD-10-CM | POA: Diagnosis not present

## 2018-08-16 DIAGNOSIS — R262 Difficulty in walking, not elsewhere classified: Secondary | ICD-10-CM | POA: Diagnosis not present

## 2018-08-19 DIAGNOSIS — M6281 Muscle weakness (generalized): Secondary | ICD-10-CM | POA: Diagnosis not present

## 2018-08-19 DIAGNOSIS — R262 Difficulty in walking, not elsewhere classified: Secondary | ICD-10-CM | POA: Diagnosis not present

## 2018-08-19 DIAGNOSIS — M25561 Pain in right knee: Secondary | ICD-10-CM | POA: Diagnosis not present

## 2018-08-19 DIAGNOSIS — M25661 Stiffness of right knee, not elsewhere classified: Secondary | ICD-10-CM | POA: Diagnosis not present

## 2018-08-21 DIAGNOSIS — M6281 Muscle weakness (generalized): Secondary | ICD-10-CM | POA: Diagnosis not present

## 2018-08-21 DIAGNOSIS — M25661 Stiffness of right knee, not elsewhere classified: Secondary | ICD-10-CM | POA: Diagnosis not present

## 2018-08-21 DIAGNOSIS — M25561 Pain in right knee: Secondary | ICD-10-CM | POA: Diagnosis not present

## 2018-08-21 DIAGNOSIS — R262 Difficulty in walking, not elsewhere classified: Secondary | ICD-10-CM | POA: Diagnosis not present

## 2018-08-23 DIAGNOSIS — R262 Difficulty in walking, not elsewhere classified: Secondary | ICD-10-CM | POA: Diagnosis not present

## 2018-08-23 DIAGNOSIS — M25561 Pain in right knee: Secondary | ICD-10-CM | POA: Diagnosis not present

## 2018-08-23 DIAGNOSIS — M25661 Stiffness of right knee, not elsewhere classified: Secondary | ICD-10-CM | POA: Diagnosis not present

## 2018-08-23 DIAGNOSIS — M6281 Muscle weakness (generalized): Secondary | ICD-10-CM | POA: Diagnosis not present

## 2018-08-26 DIAGNOSIS — M25661 Stiffness of right knee, not elsewhere classified: Secondary | ICD-10-CM | POA: Diagnosis not present

## 2018-08-26 DIAGNOSIS — M25561 Pain in right knee: Secondary | ICD-10-CM | POA: Diagnosis not present

## 2018-08-26 DIAGNOSIS — M6281 Muscle weakness (generalized): Secondary | ICD-10-CM | POA: Diagnosis not present

## 2018-08-26 DIAGNOSIS — R262 Difficulty in walking, not elsewhere classified: Secondary | ICD-10-CM | POA: Diagnosis not present

## 2018-08-30 DIAGNOSIS — M25661 Stiffness of right knee, not elsewhere classified: Secondary | ICD-10-CM | POA: Diagnosis not present

## 2018-08-30 DIAGNOSIS — R262 Difficulty in walking, not elsewhere classified: Secondary | ICD-10-CM | POA: Diagnosis not present

## 2018-08-30 DIAGNOSIS — M6281 Muscle weakness (generalized): Secondary | ICD-10-CM | POA: Diagnosis not present

## 2018-08-30 DIAGNOSIS — M25561 Pain in right knee: Secondary | ICD-10-CM | POA: Diagnosis not present

## 2018-09-02 DIAGNOSIS — M25661 Stiffness of right knee, not elsewhere classified: Secondary | ICD-10-CM | POA: Diagnosis not present

## 2018-09-02 DIAGNOSIS — M25561 Pain in right knee: Secondary | ICD-10-CM | POA: Diagnosis not present

## 2018-09-02 DIAGNOSIS — R262 Difficulty in walking, not elsewhere classified: Secondary | ICD-10-CM | POA: Diagnosis not present

## 2018-09-02 DIAGNOSIS — M6281 Muscle weakness (generalized): Secondary | ICD-10-CM | POA: Diagnosis not present

## 2018-09-06 DIAGNOSIS — R262 Difficulty in walking, not elsewhere classified: Secondary | ICD-10-CM | POA: Diagnosis not present

## 2018-09-06 DIAGNOSIS — M25561 Pain in right knee: Secondary | ICD-10-CM | POA: Diagnosis not present

## 2018-09-06 DIAGNOSIS — M25661 Stiffness of right knee, not elsewhere classified: Secondary | ICD-10-CM | POA: Diagnosis not present

## 2018-09-06 DIAGNOSIS — M6281 Muscle weakness (generalized): Secondary | ICD-10-CM | POA: Diagnosis not present

## 2018-09-11 DIAGNOSIS — M25561 Pain in right knee: Secondary | ICD-10-CM | POA: Diagnosis not present

## 2018-09-11 DIAGNOSIS — M25661 Stiffness of right knee, not elsewhere classified: Secondary | ICD-10-CM | POA: Diagnosis not present

## 2018-09-11 DIAGNOSIS — M6281 Muscle weakness (generalized): Secondary | ICD-10-CM | POA: Diagnosis not present

## 2018-09-11 DIAGNOSIS — R262 Difficulty in walking, not elsewhere classified: Secondary | ICD-10-CM | POA: Diagnosis not present

## 2018-09-13 DIAGNOSIS — M25661 Stiffness of right knee, not elsewhere classified: Secondary | ICD-10-CM | POA: Diagnosis not present

## 2018-09-13 DIAGNOSIS — M6281 Muscle weakness (generalized): Secondary | ICD-10-CM | POA: Diagnosis not present

## 2018-09-13 DIAGNOSIS — R262 Difficulty in walking, not elsewhere classified: Secondary | ICD-10-CM | POA: Diagnosis not present

## 2018-09-13 DIAGNOSIS — M25561 Pain in right knee: Secondary | ICD-10-CM | POA: Diagnosis not present

## 2018-09-18 DIAGNOSIS — M6281 Muscle weakness (generalized): Secondary | ICD-10-CM | POA: Diagnosis not present

## 2018-09-18 DIAGNOSIS — M25561 Pain in right knee: Secondary | ICD-10-CM | POA: Diagnosis not present

## 2018-09-18 DIAGNOSIS — M25661 Stiffness of right knee, not elsewhere classified: Secondary | ICD-10-CM | POA: Diagnosis not present

## 2018-09-18 DIAGNOSIS — R262 Difficulty in walking, not elsewhere classified: Secondary | ICD-10-CM | POA: Diagnosis not present

## 2018-09-20 DIAGNOSIS — M25661 Stiffness of right knee, not elsewhere classified: Secondary | ICD-10-CM | POA: Diagnosis not present

## 2018-09-20 DIAGNOSIS — R262 Difficulty in walking, not elsewhere classified: Secondary | ICD-10-CM | POA: Diagnosis not present

## 2018-09-20 DIAGNOSIS — M25561 Pain in right knee: Secondary | ICD-10-CM | POA: Diagnosis not present

## 2018-09-20 DIAGNOSIS — M6281 Muscle weakness (generalized): Secondary | ICD-10-CM | POA: Diagnosis not present

## 2018-09-27 DIAGNOSIS — M25561 Pain in right knee: Secondary | ICD-10-CM | POA: Diagnosis not present

## 2018-09-27 DIAGNOSIS — M6281 Muscle weakness (generalized): Secondary | ICD-10-CM | POA: Diagnosis not present

## 2018-09-27 DIAGNOSIS — R262 Difficulty in walking, not elsewhere classified: Secondary | ICD-10-CM | POA: Diagnosis not present

## 2018-09-27 DIAGNOSIS — M25661 Stiffness of right knee, not elsewhere classified: Secondary | ICD-10-CM | POA: Diagnosis not present

## 2018-10-02 DIAGNOSIS — R262 Difficulty in walking, not elsewhere classified: Secondary | ICD-10-CM | POA: Diagnosis not present

## 2018-10-02 DIAGNOSIS — M25661 Stiffness of right knee, not elsewhere classified: Secondary | ICD-10-CM | POA: Diagnosis not present

## 2018-10-02 DIAGNOSIS — M6281 Muscle weakness (generalized): Secondary | ICD-10-CM | POA: Diagnosis not present

## 2018-10-02 DIAGNOSIS — M25561 Pain in right knee: Secondary | ICD-10-CM | POA: Diagnosis not present

## 2018-10-04 DIAGNOSIS — M25561 Pain in right knee: Secondary | ICD-10-CM | POA: Diagnosis not present

## 2018-10-04 DIAGNOSIS — M25661 Stiffness of right knee, not elsewhere classified: Secondary | ICD-10-CM | POA: Diagnosis not present

## 2018-10-04 DIAGNOSIS — R262 Difficulty in walking, not elsewhere classified: Secondary | ICD-10-CM | POA: Diagnosis not present

## 2018-10-04 DIAGNOSIS — M6281 Muscle weakness (generalized): Secondary | ICD-10-CM | POA: Diagnosis not present

## 2018-10-09 DIAGNOSIS — R262 Difficulty in walking, not elsewhere classified: Secondary | ICD-10-CM | POA: Diagnosis not present

## 2018-10-09 DIAGNOSIS — M25561 Pain in right knee: Secondary | ICD-10-CM | POA: Diagnosis not present

## 2018-10-09 DIAGNOSIS — M6281 Muscle weakness (generalized): Secondary | ICD-10-CM | POA: Diagnosis not present

## 2018-10-09 DIAGNOSIS — M25661 Stiffness of right knee, not elsewhere classified: Secondary | ICD-10-CM | POA: Diagnosis not present

## 2018-10-11 DIAGNOSIS — M6281 Muscle weakness (generalized): Secondary | ICD-10-CM | POA: Diagnosis not present

## 2018-10-11 DIAGNOSIS — M25561 Pain in right knee: Secondary | ICD-10-CM | POA: Diagnosis not present

## 2018-10-11 DIAGNOSIS — R262 Difficulty in walking, not elsewhere classified: Secondary | ICD-10-CM | POA: Diagnosis not present

## 2018-10-11 DIAGNOSIS — M25661 Stiffness of right knee, not elsewhere classified: Secondary | ICD-10-CM | POA: Diagnosis not present

## 2018-10-18 DIAGNOSIS — R262 Difficulty in walking, not elsewhere classified: Secondary | ICD-10-CM | POA: Diagnosis not present

## 2018-10-18 DIAGNOSIS — M6281 Muscle weakness (generalized): Secondary | ICD-10-CM | POA: Diagnosis not present

## 2018-10-18 DIAGNOSIS — M25661 Stiffness of right knee, not elsewhere classified: Secondary | ICD-10-CM | POA: Diagnosis not present

## 2018-10-18 DIAGNOSIS — M25561 Pain in right knee: Secondary | ICD-10-CM | POA: Diagnosis not present

## 2018-10-31 ENCOUNTER — Other Ambulatory Visit: Payer: Self-pay | Admitting: Rheumatology

## 2018-10-31 NOTE — Telephone Encounter (Signed)
Last Visit: 06/11/18 Next Visit: 11/12/18 Labs: 07/30/18 WNL Eye exam:  11/27/17 WNL  Okay to refill per Dr. Estanislado Pandy

## 2018-11-04 DIAGNOSIS — M1611 Unilateral primary osteoarthritis, right hip: Secondary | ICD-10-CM | POA: Diagnosis not present

## 2018-11-04 DIAGNOSIS — K219 Gastro-esophageal reflux disease without esophagitis: Secondary | ICD-10-CM | POA: Diagnosis not present

## 2018-11-04 DIAGNOSIS — I10 Essential (primary) hypertension: Secondary | ICD-10-CM | POA: Diagnosis not present

## 2018-11-04 DIAGNOSIS — R7303 Prediabetes: Secondary | ICD-10-CM | POA: Diagnosis not present

## 2018-11-04 DIAGNOSIS — M069 Rheumatoid arthritis, unspecified: Secondary | ICD-10-CM | POA: Diagnosis not present

## 2018-11-12 ENCOUNTER — Ambulatory Visit: Payer: Medicare HMO | Admitting: Rheumatology

## 2018-11-12 DIAGNOSIS — R7303 Prediabetes: Secondary | ICD-10-CM | POA: Diagnosis not present

## 2018-11-12 DIAGNOSIS — I10 Essential (primary) hypertension: Secondary | ICD-10-CM | POA: Diagnosis not present

## 2018-11-12 DIAGNOSIS — M069 Rheumatoid arthritis, unspecified: Secondary | ICD-10-CM | POA: Diagnosis not present

## 2018-11-12 DIAGNOSIS — M1611 Unilateral primary osteoarthritis, right hip: Secondary | ICD-10-CM | POA: Diagnosis not present

## 2018-11-14 DIAGNOSIS — Z471 Aftercare following joint replacement surgery: Secondary | ICD-10-CM | POA: Diagnosis not present

## 2018-11-14 DIAGNOSIS — Z96651 Presence of right artificial knee joint: Secondary | ICD-10-CM | POA: Diagnosis not present

## 2018-11-29 DIAGNOSIS — H43823 Vitreomacular adhesion, bilateral: Secondary | ICD-10-CM | POA: Diagnosis not present

## 2018-11-29 DIAGNOSIS — Z79899 Other long term (current) drug therapy: Secondary | ICD-10-CM | POA: Diagnosis not present

## 2018-11-29 DIAGNOSIS — Z961 Presence of intraocular lens: Secondary | ICD-10-CM | POA: Diagnosis not present

## 2018-12-06 DIAGNOSIS — R7303 Prediabetes: Secondary | ICD-10-CM | POA: Diagnosis not present

## 2018-12-06 DIAGNOSIS — I10 Essential (primary) hypertension: Secondary | ICD-10-CM | POA: Diagnosis not present

## 2018-12-06 DIAGNOSIS — E782 Mixed hyperlipidemia: Secondary | ICD-10-CM | POA: Diagnosis not present

## 2018-12-12 DIAGNOSIS — R7303 Prediabetes: Secondary | ICD-10-CM | POA: Diagnosis not present

## 2018-12-12 DIAGNOSIS — E782 Mixed hyperlipidemia: Secondary | ICD-10-CM | POA: Diagnosis not present

## 2018-12-12 DIAGNOSIS — I10 Essential (primary) hypertension: Secondary | ICD-10-CM | POA: Diagnosis not present

## 2018-12-12 DIAGNOSIS — M069 Rheumatoid arthritis, unspecified: Secondary | ICD-10-CM | POA: Diagnosis not present

## 2018-12-13 DIAGNOSIS — I10 Essential (primary) hypertension: Secondary | ICD-10-CM | POA: Diagnosis not present

## 2018-12-13 DIAGNOSIS — R7303 Prediabetes: Secondary | ICD-10-CM | POA: Diagnosis not present

## 2018-12-13 DIAGNOSIS — M069 Rheumatoid arthritis, unspecified: Secondary | ICD-10-CM | POA: Diagnosis not present

## 2018-12-13 DIAGNOSIS — E782 Mixed hyperlipidemia: Secondary | ICD-10-CM | POA: Diagnosis not present

## 2019-01-10 DIAGNOSIS — R7303 Prediabetes: Secondary | ICD-10-CM | POA: Diagnosis not present

## 2019-01-10 DIAGNOSIS — E782 Mixed hyperlipidemia: Secondary | ICD-10-CM | POA: Diagnosis not present

## 2019-01-10 DIAGNOSIS — M069 Rheumatoid arthritis, unspecified: Secondary | ICD-10-CM | POA: Diagnosis not present

## 2019-01-10 DIAGNOSIS — I10 Essential (primary) hypertension: Secondary | ICD-10-CM | POA: Diagnosis not present

## 2019-02-11 DIAGNOSIS — I10 Essential (primary) hypertension: Secondary | ICD-10-CM | POA: Diagnosis not present

## 2019-02-11 DIAGNOSIS — E782 Mixed hyperlipidemia: Secondary | ICD-10-CM | POA: Diagnosis not present

## 2019-02-11 DIAGNOSIS — R7303 Prediabetes: Secondary | ICD-10-CM | POA: Diagnosis not present

## 2019-02-11 DIAGNOSIS — M069 Rheumatoid arthritis, unspecified: Secondary | ICD-10-CM | POA: Diagnosis not present

## 2019-03-14 DIAGNOSIS — E782 Mixed hyperlipidemia: Secondary | ICD-10-CM | POA: Diagnosis not present

## 2019-03-14 DIAGNOSIS — R7303 Prediabetes: Secondary | ICD-10-CM | POA: Diagnosis not present

## 2019-03-14 DIAGNOSIS — I10 Essential (primary) hypertension: Secondary | ICD-10-CM | POA: Diagnosis not present

## 2019-03-21 DIAGNOSIS — M069 Rheumatoid arthritis, unspecified: Secondary | ICD-10-CM | POA: Diagnosis not present

## 2019-03-21 DIAGNOSIS — Z139 Encounter for screening, unspecified: Secondary | ICD-10-CM | POA: Diagnosis not present

## 2019-03-21 DIAGNOSIS — Z0000 Encounter for general adult medical examination without abnormal findings: Secondary | ICD-10-CM | POA: Diagnosis not present

## 2019-03-21 DIAGNOSIS — R7303 Prediabetes: Secondary | ICD-10-CM | POA: Diagnosis not present

## 2019-03-21 DIAGNOSIS — I10 Essential (primary) hypertension: Secondary | ICD-10-CM | POA: Diagnosis not present

## 2019-03-21 DIAGNOSIS — N3946 Mixed incontinence: Secondary | ICD-10-CM | POA: Diagnosis not present

## 2019-03-21 DIAGNOSIS — E782 Mixed hyperlipidemia: Secondary | ICD-10-CM | POA: Diagnosis not present

## 2019-03-21 DIAGNOSIS — Z1331 Encounter for screening for depression: Secondary | ICD-10-CM | POA: Diagnosis not present

## 2019-04-03 DIAGNOSIS — M25562 Pain in left knee: Secondary | ICD-10-CM | POA: Diagnosis not present

## 2019-04-03 DIAGNOSIS — M7989 Other specified soft tissue disorders: Secondary | ICD-10-CM | POA: Diagnosis not present

## 2019-04-03 DIAGNOSIS — Z96653 Presence of artificial knee joint, bilateral: Secondary | ICD-10-CM | POA: Diagnosis not present

## 2019-04-03 DIAGNOSIS — M25561 Pain in right knee: Secondary | ICD-10-CM | POA: Diagnosis not present

## 2019-04-14 DIAGNOSIS — I10 Essential (primary) hypertension: Secondary | ICD-10-CM | POA: Diagnosis not present

## 2019-04-14 DIAGNOSIS — R7303 Prediabetes: Secondary | ICD-10-CM | POA: Diagnosis not present

## 2019-04-14 DIAGNOSIS — M069 Rheumatoid arthritis, unspecified: Secondary | ICD-10-CM | POA: Diagnosis not present

## 2019-04-14 DIAGNOSIS — E782 Mixed hyperlipidemia: Secondary | ICD-10-CM | POA: Diagnosis not present

## 2019-04-25 DIAGNOSIS — Z139 Encounter for screening, unspecified: Secondary | ICD-10-CM | POA: Diagnosis not present

## 2019-04-25 DIAGNOSIS — Z7189 Other specified counseling: Secondary | ICD-10-CM | POA: Diagnosis not present

## 2019-04-25 DIAGNOSIS — Z0000 Encounter for general adult medical examination without abnormal findings: Secondary | ICD-10-CM | POA: Diagnosis not present

## 2019-04-25 DIAGNOSIS — Z1339 Encounter for screening examination for other mental health and behavioral disorders: Secondary | ICD-10-CM | POA: Diagnosis not present

## 2019-04-25 DIAGNOSIS — Z1331 Encounter for screening for depression: Secondary | ICD-10-CM | POA: Diagnosis not present

## 2019-04-29 ENCOUNTER — Other Ambulatory Visit: Payer: Self-pay | Admitting: Rheumatology

## 2019-05-14 DIAGNOSIS — R7303 Prediabetes: Secondary | ICD-10-CM | POA: Diagnosis not present

## 2019-05-14 DIAGNOSIS — I10 Essential (primary) hypertension: Secondary | ICD-10-CM | POA: Diagnosis not present

## 2019-05-14 DIAGNOSIS — E782 Mixed hyperlipidemia: Secondary | ICD-10-CM | POA: Diagnosis not present

## 2019-05-14 DIAGNOSIS — M069 Rheumatoid arthritis, unspecified: Secondary | ICD-10-CM | POA: Diagnosis not present

## 2019-06-13 DIAGNOSIS — E782 Mixed hyperlipidemia: Secondary | ICD-10-CM | POA: Diagnosis not present

## 2019-06-13 DIAGNOSIS — M069 Rheumatoid arthritis, unspecified: Secondary | ICD-10-CM | POA: Diagnosis not present

## 2019-06-13 DIAGNOSIS — R7303 Prediabetes: Secondary | ICD-10-CM | POA: Diagnosis not present

## 2019-06-13 DIAGNOSIS — I10 Essential (primary) hypertension: Secondary | ICD-10-CM | POA: Diagnosis not present

## 2019-07-14 DIAGNOSIS — M069 Rheumatoid arthritis, unspecified: Secondary | ICD-10-CM | POA: Diagnosis not present

## 2019-07-14 DIAGNOSIS — I10 Essential (primary) hypertension: Secondary | ICD-10-CM | POA: Diagnosis not present

## 2019-07-14 DIAGNOSIS — E782 Mixed hyperlipidemia: Secondary | ICD-10-CM | POA: Diagnosis not present

## 2019-07-14 DIAGNOSIS — R7303 Prediabetes: Secondary | ICD-10-CM | POA: Diagnosis not present

## 2019-08-10 DIAGNOSIS — J1289 Other viral pneumonia: Secondary | ICD-10-CM | POA: Diagnosis not present

## 2019-08-10 DIAGNOSIS — R0600 Dyspnea, unspecified: Secondary | ICD-10-CM | POA: Diagnosis not present

## 2019-08-10 DIAGNOSIS — N179 Acute kidney failure, unspecified: Secondary | ICD-10-CM | POA: Diagnosis not present

## 2019-08-10 DIAGNOSIS — R7303 Prediabetes: Secondary | ICD-10-CM | POA: Diagnosis not present

## 2019-08-10 DIAGNOSIS — E785 Hyperlipidemia, unspecified: Secondary | ICD-10-CM | POA: Diagnosis not present

## 2019-08-10 DIAGNOSIS — R0602 Shortness of breath: Secondary | ICD-10-CM | POA: Diagnosis not present

## 2019-08-10 DIAGNOSIS — E782 Mixed hyperlipidemia: Secondary | ICD-10-CM | POA: Diagnosis not present

## 2019-08-10 DIAGNOSIS — M0609 Rheumatoid arthritis without rheumatoid factor, multiple sites: Secondary | ICD-10-CM | POA: Diagnosis not present

## 2019-08-10 DIAGNOSIS — U071 COVID-19: Secondary | ICD-10-CM | POA: Diagnosis not present

## 2019-08-10 DIAGNOSIS — Z96653 Presence of artificial knee joint, bilateral: Secondary | ICD-10-CM | POA: Diagnosis not present

## 2019-08-10 DIAGNOSIS — J9601 Acute respiratory failure with hypoxia: Secondary | ICD-10-CM | POA: Diagnosis not present

## 2019-08-10 DIAGNOSIS — Z87891 Personal history of nicotine dependence: Secondary | ICD-10-CM | POA: Diagnosis not present

## 2019-08-10 DIAGNOSIS — I10 Essential (primary) hypertension: Secondary | ICD-10-CM | POA: Diagnosis not present

## 2019-08-10 DIAGNOSIS — M069 Rheumatoid arthritis, unspecified: Secondary | ICD-10-CM | POA: Diagnosis not present

## 2019-08-13 DIAGNOSIS — E782 Mixed hyperlipidemia: Secondary | ICD-10-CM | POA: Diagnosis not present

## 2019-08-13 DIAGNOSIS — R7303 Prediabetes: Secondary | ICD-10-CM | POA: Diagnosis not present

## 2019-08-13 DIAGNOSIS — I10 Essential (primary) hypertension: Secondary | ICD-10-CM | POA: Diagnosis not present

## 2019-08-13 DIAGNOSIS — M069 Rheumatoid arthritis, unspecified: Secondary | ICD-10-CM | POA: Diagnosis not present

## 2019-08-19 DIAGNOSIS — U071 COVID-19: Secondary | ICD-10-CM | POA: Diagnosis not present

## 2019-08-19 DIAGNOSIS — J1282 Pneumonia due to coronavirus disease 2019: Secondary | ICD-10-CM | POA: Diagnosis not present

## 2019-08-19 DIAGNOSIS — Z7901 Long term (current) use of anticoagulants: Secondary | ICD-10-CM | POA: Diagnosis not present

## 2019-08-19 DIAGNOSIS — N3281 Overactive bladder: Secondary | ICD-10-CM | POA: Diagnosis not present

## 2019-08-19 DIAGNOSIS — K219 Gastro-esophageal reflux disease without esophagitis: Secondary | ICD-10-CM | POA: Diagnosis not present

## 2019-08-19 DIAGNOSIS — M0579 Rheumatoid arthritis with rheumatoid factor of multiple sites without organ or systems involvement: Secondary | ICD-10-CM | POA: Diagnosis not present

## 2019-08-19 DIAGNOSIS — D649 Anemia, unspecified: Secondary | ICD-10-CM | POA: Diagnosis not present

## 2019-08-19 DIAGNOSIS — E785 Hyperlipidemia, unspecified: Secondary | ICD-10-CM | POA: Diagnosis not present

## 2019-08-19 DIAGNOSIS — I10 Essential (primary) hypertension: Secondary | ICD-10-CM | POA: Diagnosis not present

## 2019-08-25 DIAGNOSIS — U071 COVID-19: Secondary | ICD-10-CM | POA: Diagnosis not present

## 2019-08-25 DIAGNOSIS — K219 Gastro-esophageal reflux disease without esophagitis: Secondary | ICD-10-CM | POA: Diagnosis not present

## 2019-08-25 DIAGNOSIS — I10 Essential (primary) hypertension: Secondary | ICD-10-CM | POA: Diagnosis not present

## 2019-08-25 DIAGNOSIS — E785 Hyperlipidemia, unspecified: Secondary | ICD-10-CM | POA: Diagnosis not present

## 2019-08-25 DIAGNOSIS — Z7901 Long term (current) use of anticoagulants: Secondary | ICD-10-CM | POA: Diagnosis not present

## 2019-08-25 DIAGNOSIS — J1282 Pneumonia due to coronavirus disease 2019: Secondary | ICD-10-CM | POA: Diagnosis not present

## 2019-08-25 DIAGNOSIS — N3281 Overactive bladder: Secondary | ICD-10-CM | POA: Diagnosis not present

## 2019-08-25 DIAGNOSIS — M0579 Rheumatoid arthritis with rheumatoid factor of multiple sites without organ or systems involvement: Secondary | ICD-10-CM | POA: Diagnosis not present

## 2019-08-25 DIAGNOSIS — D649 Anemia, unspecified: Secondary | ICD-10-CM | POA: Diagnosis not present

## 2019-08-26 DIAGNOSIS — I10 Essential (primary) hypertension: Secondary | ICD-10-CM | POA: Diagnosis not present

## 2019-08-26 DIAGNOSIS — K219 Gastro-esophageal reflux disease without esophagitis: Secondary | ICD-10-CM | POA: Diagnosis not present

## 2019-08-26 DIAGNOSIS — M0579 Rheumatoid arthritis with rheumatoid factor of multiple sites without organ or systems involvement: Secondary | ICD-10-CM | POA: Diagnosis not present

## 2019-08-26 DIAGNOSIS — N3281 Overactive bladder: Secondary | ICD-10-CM | POA: Diagnosis not present

## 2019-08-26 DIAGNOSIS — E785 Hyperlipidemia, unspecified: Secondary | ICD-10-CM | POA: Diagnosis not present

## 2019-08-26 DIAGNOSIS — D649 Anemia, unspecified: Secondary | ICD-10-CM | POA: Diagnosis not present

## 2019-08-26 DIAGNOSIS — Z7901 Long term (current) use of anticoagulants: Secondary | ICD-10-CM | POA: Diagnosis not present

## 2019-08-26 DIAGNOSIS — U071 COVID-19: Secondary | ICD-10-CM | POA: Diagnosis not present

## 2019-08-26 DIAGNOSIS — J1282 Pneumonia due to coronavirus disease 2019: Secondary | ICD-10-CM | POA: Diagnosis not present

## 2019-08-29 DIAGNOSIS — R0602 Shortness of breath: Secondary | ICD-10-CM | POA: Diagnosis not present

## 2019-08-29 DIAGNOSIS — J1282 Pneumonia due to coronavirus disease 2019: Secondary | ICD-10-CM | POA: Diagnosis not present

## 2019-08-29 DIAGNOSIS — U071 COVID-19: Secondary | ICD-10-CM | POA: Diagnosis not present

## 2019-09-01 DIAGNOSIS — Z7901 Long term (current) use of anticoagulants: Secondary | ICD-10-CM | POA: Diagnosis not present

## 2019-09-01 DIAGNOSIS — U071 COVID-19: Secondary | ICD-10-CM | POA: Diagnosis not present

## 2019-09-01 DIAGNOSIS — D649 Anemia, unspecified: Secondary | ICD-10-CM | POA: Diagnosis not present

## 2019-09-01 DIAGNOSIS — K219 Gastro-esophageal reflux disease without esophagitis: Secondary | ICD-10-CM | POA: Diagnosis not present

## 2019-09-01 DIAGNOSIS — M0579 Rheumatoid arthritis with rheumatoid factor of multiple sites without organ or systems involvement: Secondary | ICD-10-CM | POA: Diagnosis not present

## 2019-09-01 DIAGNOSIS — I10 Essential (primary) hypertension: Secondary | ICD-10-CM | POA: Diagnosis not present

## 2019-09-01 DIAGNOSIS — N3281 Overactive bladder: Secondary | ICD-10-CM | POA: Diagnosis not present

## 2019-09-01 DIAGNOSIS — E785 Hyperlipidemia, unspecified: Secondary | ICD-10-CM | POA: Diagnosis not present

## 2019-09-01 DIAGNOSIS — J1282 Pneumonia due to coronavirus disease 2019: Secondary | ICD-10-CM | POA: Diagnosis not present

## 2019-09-03 DIAGNOSIS — D649 Anemia, unspecified: Secondary | ICD-10-CM | POA: Diagnosis not present

## 2019-09-03 DIAGNOSIS — Z7901 Long term (current) use of anticoagulants: Secondary | ICD-10-CM | POA: Diagnosis not present

## 2019-09-03 DIAGNOSIS — J1282 Pneumonia due to coronavirus disease 2019: Secondary | ICD-10-CM | POA: Diagnosis not present

## 2019-09-03 DIAGNOSIS — U071 COVID-19: Secondary | ICD-10-CM | POA: Diagnosis not present

## 2019-09-03 DIAGNOSIS — M0579 Rheumatoid arthritis with rheumatoid factor of multiple sites without organ or systems involvement: Secondary | ICD-10-CM | POA: Diagnosis not present

## 2019-09-03 DIAGNOSIS — I10 Essential (primary) hypertension: Secondary | ICD-10-CM | POA: Diagnosis not present

## 2019-09-03 DIAGNOSIS — E785 Hyperlipidemia, unspecified: Secondary | ICD-10-CM | POA: Diagnosis not present

## 2019-09-03 DIAGNOSIS — N3281 Overactive bladder: Secondary | ICD-10-CM | POA: Diagnosis not present

## 2019-09-03 DIAGNOSIS — K219 Gastro-esophageal reflux disease without esophagitis: Secondary | ICD-10-CM | POA: Diagnosis not present

## 2019-09-04 DIAGNOSIS — E785 Hyperlipidemia, unspecified: Secondary | ICD-10-CM | POA: Diagnosis not present

## 2019-09-04 DIAGNOSIS — M0579 Rheumatoid arthritis with rheumatoid factor of multiple sites without organ or systems involvement: Secondary | ICD-10-CM | POA: Diagnosis not present

## 2019-09-04 DIAGNOSIS — Z7901 Long term (current) use of anticoagulants: Secondary | ICD-10-CM | POA: Diagnosis not present

## 2019-09-04 DIAGNOSIS — N3281 Overactive bladder: Secondary | ICD-10-CM | POA: Diagnosis not present

## 2019-09-04 DIAGNOSIS — J1282 Pneumonia due to coronavirus disease 2019: Secondary | ICD-10-CM | POA: Diagnosis not present

## 2019-09-04 DIAGNOSIS — D649 Anemia, unspecified: Secondary | ICD-10-CM | POA: Diagnosis not present

## 2019-09-04 DIAGNOSIS — K219 Gastro-esophageal reflux disease without esophagitis: Secondary | ICD-10-CM | POA: Diagnosis not present

## 2019-09-04 DIAGNOSIS — U071 COVID-19: Secondary | ICD-10-CM | POA: Diagnosis not present

## 2019-09-04 DIAGNOSIS — I10 Essential (primary) hypertension: Secondary | ICD-10-CM | POA: Diagnosis not present

## 2019-09-08 DIAGNOSIS — K219 Gastro-esophageal reflux disease without esophagitis: Secondary | ICD-10-CM | POA: Diagnosis not present

## 2019-09-08 DIAGNOSIS — Z7901 Long term (current) use of anticoagulants: Secondary | ICD-10-CM | POA: Diagnosis not present

## 2019-09-08 DIAGNOSIS — J1282 Pneumonia due to coronavirus disease 2019: Secondary | ICD-10-CM | POA: Diagnosis not present

## 2019-09-08 DIAGNOSIS — I10 Essential (primary) hypertension: Secondary | ICD-10-CM | POA: Diagnosis not present

## 2019-09-08 DIAGNOSIS — N3281 Overactive bladder: Secondary | ICD-10-CM | POA: Diagnosis not present

## 2019-09-08 DIAGNOSIS — M0579 Rheumatoid arthritis with rheumatoid factor of multiple sites without organ or systems involvement: Secondary | ICD-10-CM | POA: Diagnosis not present

## 2019-09-08 DIAGNOSIS — D649 Anemia, unspecified: Secondary | ICD-10-CM | POA: Diagnosis not present

## 2019-09-08 DIAGNOSIS — E785 Hyperlipidemia, unspecified: Secondary | ICD-10-CM | POA: Diagnosis not present

## 2019-09-08 DIAGNOSIS — U071 COVID-19: Secondary | ICD-10-CM | POA: Diagnosis not present

## 2019-09-09 DIAGNOSIS — K219 Gastro-esophageal reflux disease without esophagitis: Secondary | ICD-10-CM | POA: Diagnosis not present

## 2019-09-09 DIAGNOSIS — U071 COVID-19: Secondary | ICD-10-CM | POA: Diagnosis not present

## 2019-09-09 DIAGNOSIS — Z7901 Long term (current) use of anticoagulants: Secondary | ICD-10-CM | POA: Diagnosis not present

## 2019-09-09 DIAGNOSIS — D649 Anemia, unspecified: Secondary | ICD-10-CM | POA: Diagnosis not present

## 2019-09-09 DIAGNOSIS — E785 Hyperlipidemia, unspecified: Secondary | ICD-10-CM | POA: Diagnosis not present

## 2019-09-09 DIAGNOSIS — N3281 Overactive bladder: Secondary | ICD-10-CM | POA: Diagnosis not present

## 2019-09-09 DIAGNOSIS — I10 Essential (primary) hypertension: Secondary | ICD-10-CM | POA: Diagnosis not present

## 2019-09-09 DIAGNOSIS — J1282 Pneumonia due to coronavirus disease 2019: Secondary | ICD-10-CM | POA: Diagnosis not present

## 2019-09-09 DIAGNOSIS — M0579 Rheumatoid arthritis with rheumatoid factor of multiple sites without organ or systems involvement: Secondary | ICD-10-CM | POA: Diagnosis not present

## 2019-09-10 DIAGNOSIS — N3281 Overactive bladder: Secondary | ICD-10-CM | POA: Diagnosis not present

## 2019-09-10 DIAGNOSIS — K219 Gastro-esophageal reflux disease without esophagitis: Secondary | ICD-10-CM | POA: Diagnosis not present

## 2019-09-10 DIAGNOSIS — I10 Essential (primary) hypertension: Secondary | ICD-10-CM | POA: Diagnosis not present

## 2019-09-10 DIAGNOSIS — J1282 Pneumonia due to coronavirus disease 2019: Secondary | ICD-10-CM | POA: Diagnosis not present

## 2019-09-10 DIAGNOSIS — E785 Hyperlipidemia, unspecified: Secondary | ICD-10-CM | POA: Diagnosis not present

## 2019-09-10 DIAGNOSIS — U071 COVID-19: Secondary | ICD-10-CM | POA: Diagnosis not present

## 2019-09-10 DIAGNOSIS — Z7901 Long term (current) use of anticoagulants: Secondary | ICD-10-CM | POA: Diagnosis not present

## 2019-09-10 DIAGNOSIS — M0579 Rheumatoid arthritis with rheumatoid factor of multiple sites without organ or systems involvement: Secondary | ICD-10-CM | POA: Diagnosis not present

## 2019-09-10 DIAGNOSIS — D649 Anemia, unspecified: Secondary | ICD-10-CM | POA: Diagnosis not present

## 2019-09-11 DIAGNOSIS — N3281 Overactive bladder: Secondary | ICD-10-CM | POA: Diagnosis not present

## 2019-09-11 DIAGNOSIS — I10 Essential (primary) hypertension: Secondary | ICD-10-CM | POA: Diagnosis not present

## 2019-09-11 DIAGNOSIS — D649 Anemia, unspecified: Secondary | ICD-10-CM | POA: Diagnosis not present

## 2019-09-11 DIAGNOSIS — J1282 Pneumonia due to coronavirus disease 2019: Secondary | ICD-10-CM | POA: Diagnosis not present

## 2019-09-11 DIAGNOSIS — Z7901 Long term (current) use of anticoagulants: Secondary | ICD-10-CM | POA: Diagnosis not present

## 2019-09-11 DIAGNOSIS — U071 COVID-19: Secondary | ICD-10-CM | POA: Diagnosis not present

## 2019-09-11 DIAGNOSIS — M0579 Rheumatoid arthritis with rheumatoid factor of multiple sites without organ or systems involvement: Secondary | ICD-10-CM | POA: Diagnosis not present

## 2019-09-11 DIAGNOSIS — K219 Gastro-esophageal reflux disease without esophagitis: Secondary | ICD-10-CM | POA: Diagnosis not present

## 2019-09-11 DIAGNOSIS — E785 Hyperlipidemia, unspecified: Secondary | ICD-10-CM | POA: Diagnosis not present

## 2019-09-12 DIAGNOSIS — E782 Mixed hyperlipidemia: Secondary | ICD-10-CM | POA: Diagnosis not present

## 2019-09-12 DIAGNOSIS — I10 Essential (primary) hypertension: Secondary | ICD-10-CM | POA: Diagnosis not present

## 2019-09-12 DIAGNOSIS — M069 Rheumatoid arthritis, unspecified: Secondary | ICD-10-CM | POA: Diagnosis not present

## 2019-09-12 DIAGNOSIS — R7303 Prediabetes: Secondary | ICD-10-CM | POA: Diagnosis not present

## 2019-09-16 DIAGNOSIS — U071 COVID-19: Secondary | ICD-10-CM | POA: Diagnosis not present

## 2019-09-16 DIAGNOSIS — Z7901 Long term (current) use of anticoagulants: Secondary | ICD-10-CM | POA: Diagnosis not present

## 2019-09-16 DIAGNOSIS — I10 Essential (primary) hypertension: Secondary | ICD-10-CM | POA: Diagnosis not present

## 2019-09-16 DIAGNOSIS — E785 Hyperlipidemia, unspecified: Secondary | ICD-10-CM | POA: Diagnosis not present

## 2019-09-16 DIAGNOSIS — N3281 Overactive bladder: Secondary | ICD-10-CM | POA: Diagnosis not present

## 2019-09-16 DIAGNOSIS — K219 Gastro-esophageal reflux disease without esophagitis: Secondary | ICD-10-CM | POA: Diagnosis not present

## 2019-09-16 DIAGNOSIS — D649 Anemia, unspecified: Secondary | ICD-10-CM | POA: Diagnosis not present

## 2019-09-16 DIAGNOSIS — M0579 Rheumatoid arthritis with rheumatoid factor of multiple sites without organ or systems involvement: Secondary | ICD-10-CM | POA: Diagnosis not present

## 2019-09-16 DIAGNOSIS — J1282 Pneumonia due to coronavirus disease 2019: Secondary | ICD-10-CM | POA: Diagnosis not present

## 2019-09-18 DIAGNOSIS — Z7901 Long term (current) use of anticoagulants: Secondary | ICD-10-CM | POA: Diagnosis not present

## 2019-09-18 DIAGNOSIS — I10 Essential (primary) hypertension: Secondary | ICD-10-CM | POA: Diagnosis not present

## 2019-09-18 DIAGNOSIS — M0579 Rheumatoid arthritis with rheumatoid factor of multiple sites without organ or systems involvement: Secondary | ICD-10-CM | POA: Diagnosis not present

## 2019-09-18 DIAGNOSIS — D649 Anemia, unspecified: Secondary | ICD-10-CM | POA: Diagnosis not present

## 2019-09-18 DIAGNOSIS — K219 Gastro-esophageal reflux disease without esophagitis: Secondary | ICD-10-CM | POA: Diagnosis not present

## 2019-09-18 DIAGNOSIS — J1282 Pneumonia due to coronavirus disease 2019: Secondary | ICD-10-CM | POA: Diagnosis not present

## 2019-09-18 DIAGNOSIS — N3281 Overactive bladder: Secondary | ICD-10-CM | POA: Diagnosis not present

## 2019-09-18 DIAGNOSIS — E785 Hyperlipidemia, unspecified: Secondary | ICD-10-CM | POA: Diagnosis not present

## 2019-09-18 DIAGNOSIS — U071 COVID-19: Secondary | ICD-10-CM | POA: Diagnosis not present

## 2019-09-19 DIAGNOSIS — M0579 Rheumatoid arthritis with rheumatoid factor of multiple sites without organ or systems involvement: Secondary | ICD-10-CM | POA: Diagnosis not present

## 2019-09-19 DIAGNOSIS — N3281 Overactive bladder: Secondary | ICD-10-CM | POA: Diagnosis not present

## 2019-09-19 DIAGNOSIS — K219 Gastro-esophageal reflux disease without esophagitis: Secondary | ICD-10-CM | POA: Diagnosis not present

## 2019-09-19 DIAGNOSIS — D649 Anemia, unspecified: Secondary | ICD-10-CM | POA: Diagnosis not present

## 2019-09-19 DIAGNOSIS — J1282 Pneumonia due to coronavirus disease 2019: Secondary | ICD-10-CM | POA: Diagnosis not present

## 2019-09-19 DIAGNOSIS — E785 Hyperlipidemia, unspecified: Secondary | ICD-10-CM | POA: Diagnosis not present

## 2019-09-19 DIAGNOSIS — I10 Essential (primary) hypertension: Secondary | ICD-10-CM | POA: Diagnosis not present

## 2019-09-19 DIAGNOSIS — U071 COVID-19: Secondary | ICD-10-CM | POA: Diagnosis not present

## 2019-09-19 DIAGNOSIS — Z7901 Long term (current) use of anticoagulants: Secondary | ICD-10-CM | POA: Diagnosis not present

## 2019-09-22 DIAGNOSIS — J1282 Pneumonia due to coronavirus disease 2019: Secondary | ICD-10-CM | POA: Diagnosis not present

## 2019-09-22 DIAGNOSIS — U071 COVID-19: Secondary | ICD-10-CM | POA: Diagnosis not present

## 2019-09-22 DIAGNOSIS — I10 Essential (primary) hypertension: Secondary | ICD-10-CM | POA: Diagnosis not present

## 2019-09-22 DIAGNOSIS — Z7901 Long term (current) use of anticoagulants: Secondary | ICD-10-CM | POA: Diagnosis not present

## 2019-09-22 DIAGNOSIS — N3281 Overactive bladder: Secondary | ICD-10-CM | POA: Diagnosis not present

## 2019-09-22 DIAGNOSIS — M0579 Rheumatoid arthritis with rheumatoid factor of multiple sites without organ or systems involvement: Secondary | ICD-10-CM | POA: Diagnosis not present

## 2019-09-22 DIAGNOSIS — K219 Gastro-esophageal reflux disease without esophagitis: Secondary | ICD-10-CM | POA: Diagnosis not present

## 2019-09-22 DIAGNOSIS — E785 Hyperlipidemia, unspecified: Secondary | ICD-10-CM | POA: Diagnosis not present

## 2019-09-22 DIAGNOSIS — D649 Anemia, unspecified: Secondary | ICD-10-CM | POA: Diagnosis not present

## 2019-09-24 DIAGNOSIS — E785 Hyperlipidemia, unspecified: Secondary | ICD-10-CM | POA: Diagnosis not present

## 2019-09-24 DIAGNOSIS — M0579 Rheumatoid arthritis with rheumatoid factor of multiple sites without organ or systems involvement: Secondary | ICD-10-CM | POA: Diagnosis not present

## 2019-09-24 DIAGNOSIS — U071 COVID-19: Secondary | ICD-10-CM | POA: Diagnosis not present

## 2019-09-24 DIAGNOSIS — K219 Gastro-esophageal reflux disease without esophagitis: Secondary | ICD-10-CM | POA: Diagnosis not present

## 2019-09-24 DIAGNOSIS — I10 Essential (primary) hypertension: Secondary | ICD-10-CM | POA: Diagnosis not present

## 2019-09-24 DIAGNOSIS — N3281 Overactive bladder: Secondary | ICD-10-CM | POA: Diagnosis not present

## 2019-09-24 DIAGNOSIS — D649 Anemia, unspecified: Secondary | ICD-10-CM | POA: Diagnosis not present

## 2019-09-24 DIAGNOSIS — J1282 Pneumonia due to coronavirus disease 2019: Secondary | ICD-10-CM | POA: Diagnosis not present

## 2019-09-24 DIAGNOSIS — Z7901 Long term (current) use of anticoagulants: Secondary | ICD-10-CM | POA: Diagnosis not present

## 2019-09-26 DIAGNOSIS — Z6831 Body mass index (BMI) 31.0-31.9, adult: Secondary | ICD-10-CM | POA: Diagnosis not present

## 2019-09-26 DIAGNOSIS — K5900 Constipation, unspecified: Secondary | ICD-10-CM | POA: Diagnosis not present

## 2019-09-29 DIAGNOSIS — M0579 Rheumatoid arthritis with rheumatoid factor of multiple sites without organ or systems involvement: Secondary | ICD-10-CM | POA: Diagnosis not present

## 2019-09-29 DIAGNOSIS — U071 COVID-19: Secondary | ICD-10-CM | POA: Diagnosis not present

## 2019-09-29 DIAGNOSIS — D649 Anemia, unspecified: Secondary | ICD-10-CM | POA: Diagnosis not present

## 2019-09-29 DIAGNOSIS — J1282 Pneumonia due to coronavirus disease 2019: Secondary | ICD-10-CM | POA: Diagnosis not present

## 2019-09-29 DIAGNOSIS — Z7901 Long term (current) use of anticoagulants: Secondary | ICD-10-CM | POA: Diagnosis not present

## 2019-09-29 DIAGNOSIS — E785 Hyperlipidemia, unspecified: Secondary | ICD-10-CM | POA: Diagnosis not present

## 2019-09-29 DIAGNOSIS — K219 Gastro-esophageal reflux disease without esophagitis: Secondary | ICD-10-CM | POA: Diagnosis not present

## 2019-09-29 DIAGNOSIS — N3281 Overactive bladder: Secondary | ICD-10-CM | POA: Diagnosis not present

## 2019-09-29 DIAGNOSIS — I10 Essential (primary) hypertension: Secondary | ICD-10-CM | POA: Diagnosis not present

## 2019-10-01 DIAGNOSIS — K219 Gastro-esophageal reflux disease without esophagitis: Secondary | ICD-10-CM | POA: Diagnosis not present

## 2019-10-01 DIAGNOSIS — I10 Essential (primary) hypertension: Secondary | ICD-10-CM | POA: Diagnosis not present

## 2019-10-01 DIAGNOSIS — M0579 Rheumatoid arthritis with rheumatoid factor of multiple sites without organ or systems involvement: Secondary | ICD-10-CM | POA: Diagnosis not present

## 2019-10-01 DIAGNOSIS — D649 Anemia, unspecified: Secondary | ICD-10-CM | POA: Diagnosis not present

## 2019-10-01 DIAGNOSIS — N3281 Overactive bladder: Secondary | ICD-10-CM | POA: Diagnosis not present

## 2019-10-01 DIAGNOSIS — U071 COVID-19: Secondary | ICD-10-CM | POA: Diagnosis not present

## 2019-10-01 DIAGNOSIS — J1282 Pneumonia due to coronavirus disease 2019: Secondary | ICD-10-CM | POA: Diagnosis not present

## 2019-10-01 DIAGNOSIS — E785 Hyperlipidemia, unspecified: Secondary | ICD-10-CM | POA: Diagnosis not present

## 2019-10-01 DIAGNOSIS — Z7901 Long term (current) use of anticoagulants: Secondary | ICD-10-CM | POA: Diagnosis not present

## 2019-10-06 DIAGNOSIS — K219 Gastro-esophageal reflux disease without esophagitis: Secondary | ICD-10-CM | POA: Diagnosis not present

## 2019-10-06 DIAGNOSIS — D649 Anemia, unspecified: Secondary | ICD-10-CM | POA: Diagnosis not present

## 2019-10-06 DIAGNOSIS — Z7901 Long term (current) use of anticoagulants: Secondary | ICD-10-CM | POA: Diagnosis not present

## 2019-10-06 DIAGNOSIS — E785 Hyperlipidemia, unspecified: Secondary | ICD-10-CM | POA: Diagnosis not present

## 2019-10-06 DIAGNOSIS — N3281 Overactive bladder: Secondary | ICD-10-CM | POA: Diagnosis not present

## 2019-10-06 DIAGNOSIS — U071 COVID-19: Secondary | ICD-10-CM | POA: Diagnosis not present

## 2019-10-06 DIAGNOSIS — M0579 Rheumatoid arthritis with rheumatoid factor of multiple sites without organ or systems involvement: Secondary | ICD-10-CM | POA: Diagnosis not present

## 2019-10-06 DIAGNOSIS — I10 Essential (primary) hypertension: Secondary | ICD-10-CM | POA: Diagnosis not present

## 2019-10-06 DIAGNOSIS — J1282 Pneumonia due to coronavirus disease 2019: Secondary | ICD-10-CM | POA: Diagnosis not present

## 2019-10-12 DIAGNOSIS — I10 Essential (primary) hypertension: Secondary | ICD-10-CM | POA: Diagnosis not present

## 2019-10-12 DIAGNOSIS — R7303 Prediabetes: Secondary | ICD-10-CM | POA: Diagnosis not present

## 2019-10-12 DIAGNOSIS — E782 Mixed hyperlipidemia: Secondary | ICD-10-CM | POA: Diagnosis not present

## 2019-10-12 DIAGNOSIS — M069 Rheumatoid arthritis, unspecified: Secondary | ICD-10-CM | POA: Diagnosis not present

## 2019-10-13 DIAGNOSIS — K219 Gastro-esophageal reflux disease without esophagitis: Secondary | ICD-10-CM | POA: Diagnosis not present

## 2019-10-13 DIAGNOSIS — Z7901 Long term (current) use of anticoagulants: Secondary | ICD-10-CM | POA: Diagnosis not present

## 2019-10-13 DIAGNOSIS — N3281 Overactive bladder: Secondary | ICD-10-CM | POA: Diagnosis not present

## 2019-10-13 DIAGNOSIS — I10 Essential (primary) hypertension: Secondary | ICD-10-CM | POA: Diagnosis not present

## 2019-10-13 DIAGNOSIS — M0579 Rheumatoid arthritis with rheumatoid factor of multiple sites without organ or systems involvement: Secondary | ICD-10-CM | POA: Diagnosis not present

## 2019-10-13 DIAGNOSIS — J1282 Pneumonia due to coronavirus disease 2019: Secondary | ICD-10-CM | POA: Diagnosis not present

## 2019-10-13 DIAGNOSIS — U071 COVID-19: Secondary | ICD-10-CM | POA: Diagnosis not present

## 2019-10-13 DIAGNOSIS — E785 Hyperlipidemia, unspecified: Secondary | ICD-10-CM | POA: Diagnosis not present

## 2019-10-13 DIAGNOSIS — D649 Anemia, unspecified: Secondary | ICD-10-CM | POA: Diagnosis not present

## 2019-10-17 DIAGNOSIS — E782 Mixed hyperlipidemia: Secondary | ICD-10-CM | POA: Diagnosis not present

## 2019-10-17 DIAGNOSIS — R7303 Prediabetes: Secondary | ICD-10-CM | POA: Diagnosis not present

## 2019-10-24 DIAGNOSIS — Z961 Presence of intraocular lens: Secondary | ICD-10-CM | POA: Diagnosis not present

## 2019-10-24 DIAGNOSIS — Z0100 Encounter for examination of eyes and vision without abnormal findings: Secondary | ICD-10-CM | POA: Diagnosis not present

## 2019-10-31 DIAGNOSIS — M069 Rheumatoid arthritis, unspecified: Secondary | ICD-10-CM | POA: Diagnosis not present

## 2019-10-31 DIAGNOSIS — I10 Essential (primary) hypertension: Secondary | ICD-10-CM | POA: Diagnosis not present

## 2019-10-31 DIAGNOSIS — R7303 Prediabetes: Secondary | ICD-10-CM | POA: Diagnosis not present

## 2019-10-31 DIAGNOSIS — E782 Mixed hyperlipidemia: Secondary | ICD-10-CM | POA: Diagnosis not present

## 2019-10-31 DIAGNOSIS — Z23 Encounter for immunization: Secondary | ICD-10-CM | POA: Diagnosis not present

## 2019-11-12 DIAGNOSIS — R7303 Prediabetes: Secondary | ICD-10-CM | POA: Diagnosis not present

## 2019-11-12 DIAGNOSIS — I10 Essential (primary) hypertension: Secondary | ICD-10-CM | POA: Diagnosis not present

## 2019-11-12 DIAGNOSIS — E782 Mixed hyperlipidemia: Secondary | ICD-10-CM | POA: Diagnosis not present

## 2019-11-12 DIAGNOSIS — M069 Rheumatoid arthritis, unspecified: Secondary | ICD-10-CM | POA: Diagnosis not present

## 2019-12-12 DIAGNOSIS — R7303 Prediabetes: Secondary | ICD-10-CM | POA: Diagnosis not present

## 2019-12-12 DIAGNOSIS — I10 Essential (primary) hypertension: Secondary | ICD-10-CM | POA: Diagnosis not present

## 2019-12-12 DIAGNOSIS — E782 Mixed hyperlipidemia: Secondary | ICD-10-CM | POA: Diagnosis not present

## 2019-12-12 DIAGNOSIS — M069 Rheumatoid arthritis, unspecified: Secondary | ICD-10-CM | POA: Diagnosis not present

## 2019-12-12 NOTE — Progress Notes (Signed)
Office Visit Note  Patient: Ann Gamble             Date of Birth: 1941/06/16           MRN: [id number]             PCP: Maryella Shivers, MD Referring: Maryella Shivers, MD Visit Date: 12/19/2019 Occupation: @GUAROCC @  Subjective:  Pain in multiple joints   History of Present Illness: Ann Gamble is a 79 y.o. female with history of seronegative rheumatoid arthritis and osteoarthritis.  Patient reports that she discontinued Plaquenil about 1 year ago during the COVID-19 pandemic.  She states that she is having increased pain in multiple joints including both wrist joints, both hands, both knees, and both ankle joints.  She states that her left knee replacement has been causing increased discomfort.  She is also been having increased edema in bilateral lower extremities.  She states that she has been trying to perform stretching exercises on a daily basis.  She would like to discuss restarting on Plaquenil.  Activities of Daily Living:  Patient reports morning stiffness for  several  hours.   Patient Reports nocturnal pain.  Difficulty dressing/grooming: Denies Difficulty climbing stairs: Reports Difficulty getting out of chair: Denies Difficulty using hands for taps, buttons, cutlery, and/or writing: Reports  Review of Systems  Constitutional: Negative for fatigue.  HENT: Positive for mouth dryness. Negative for mouth sores and nose dryness.   Eyes: Negative for pain, itching, visual disturbance and dryness.  Respiratory: Negative for cough, hemoptysis, shortness of breath and difficulty breathing.   Cardiovascular: Negative for chest pain, palpitations, hypertension and swelling in legs/feet.  Gastrointestinal: Negative for blood in stool, constipation and diarrhea.  Endocrine: Negative for increased urination.  Genitourinary: Negative for difficulty urinating and painful urination.  Musculoskeletal: Positive for arthralgias, joint pain, joint swelling and morning  stiffness. Negative for myalgias, muscle weakness, muscle tenderness and myalgias.  Skin: Positive for rash. Negative for color change, pallor, hair loss, nodules/bumps, skin tightness, ulcers and sensitivity to sunlight.  Allergic/Immunologic: Negative for susceptible to infections.  Neurological: Negative for headaches and weakness.  Hematological: Negative for swollen glands.  Psychiatric/Behavioral: Positive for sleep disturbance. Negative for depressed mood. The patient is not nervous/anxious.     PMFS History:  Patient Active Problem List   Diagnosis Date Noted  . 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 Rheumatoid arthritis of multiple sites with negative rheumatoid factor ([place]) 09/04/2016  . High risk medication use 09/04/2016  . Primary osteoarthritis of right knee 09/04/2016  . History of total knee replacement, left 09/04/2016  . Primary osteoarthritis of both hands 09/04/2016  . Primary osteoarthritis of both feet 09/04/2016  . Essential hypertension 09/04/2016  . Dyslipidemia 09/04/2016    Past Medical History:  Diagnosis Date  . Bilateral carpal tunnel syndrome   . Cataract immature    right  . Constipation    takes stool softener nightly  . Foot deformity, bilateral   . GERD (gastroesophageal reflux disease)   . Hx of colonic polyps   . Hyperlipidemia    takes Simvastatin daily  . Hypertension    takes Hyzaar and Amlodipine daily  . OA (osteoarthritis)   . RA (rheumatoid arthritis) ([place])   . Seasonal allergies   . Sensitive skin    pt states she has highly sensitive skin  . Urinary incontinence     Family History  Problem Relation Age of Onset  . Thyroid disease Mother   . Alzheimer's  disease Mother   . Stroke Brother   . Diabetes Son   . Anesthesia problems Neg Hx   . Hypotension Neg Hx   . Pseudochol deficiency Neg Hx   . Malignant hyperthermia Neg Hx    Past Surgical History:  Procedure Laterality Date  . CATARACT EXTRACTION Bilateral   .  COLONOSCOPY    . DENTAL SURGERY    . TONSILLECTOMY  1960  . TOTAL KNEE ARTHROPLASTY  10/30/2011   Procedure: TOTAL KNEE ARTHROPLASTY;  Surgeon: Kerin Salen, MD;  Location: [facility];  Service: Orthopedics;  Laterality: Left;  DEPUY/SIGMA  . TOTAL KNEE ARTHROPLASTY Right 08/05/2018   Procedure: TOTAL KNEE ARTHROPLASTY;  Surgeon: Vickey Huger, MD;  Location: [facility];  Service: Orthopedics;  Laterality: Right;  Marland Kitchen VAGINAL HYSTERECTOMY  1970's   partial   Social History   Social History Narrative  . Not on file    There is no immunization history on file for this patient.   Objective: Vital Signs: BP 134/77 (BP Location: Right Arm, Patient Position: Sitting, Cuff Size: Normal)   Pulse 95   Resp 17   Ht 4\' 11"  (1.499 m)   Wt 172 lb (78 kg)   BMI 34.74 kg/m    Physical Exam Vitals and nursing note reviewed.  Constitutional:      Appearance: She is well-developed.  HENT:     Head: Normocephalic and atraumatic.  Eyes:     Conjunctiva/sclera: Conjunctivae normal.  Pulmonary:     Effort: Pulmonary effort is normal.  Abdominal:     General: Bowel sounds are normal.     Palpations: Abdomen is soft.  Musculoskeletal:     Cervical back: Normal range of motion.  Lymphadenopathy:     Cervical: No cervical adenopathy.  Skin:    General: Skin is warm and dry.     Capillary Refill: Capillary refill takes less than 2 seconds.  Neurological:     Mental Status: She is alert and oriented to person, place, and time.  Psychiatric:        Behavior: Behavior normal.      Musculoskeletal Exam: C-spine limited range of motion.  Thoracic kyphosis noted.  Shoulder joints have good range of motion with no discomfort.  Elbow joints have good range of motion with no tenderness or inflammation.  Tenderness and synovitis of several MCP and PIP joints as described above.  She has incomplete fist formation bilaterally.  She has not thickening of all PIP joints.  Warmth of both knee joints noted. Ankle  joints good ROM with no tenderness or inflammation.  Pitting edema bilaterally.   CDAI Exam: CDAI Score: 15.3  Patient Global: 9 mm; Provider Global: 4 mm Swollen: 8 ; Tender: 6  Joint Exam 12/19/2019      Right  Left  MCP 2  Swollen Tender  Swollen Tender  PIP 2  Swollen Tender     PIP 3  Swollen Tender  Swollen Tender  PIP 4     Swollen Tender  Knee  Swollen   Swollen      Investigation: No additional findings.  Imaging: No results found.  Recent Labs: Lab Results  Component Value Date   WBC 10.9 (H) 08/06/2018   HGB 12.7 08/06/2018   PLT 212 08/06/2018   NA 137 08/06/2018   K 4.0 08/06/2018   CL 103 08/06/2018   CO2 26 08/06/2018   GLUCOSE 140 (H) 08/06/2018   BUN 16 08/06/2018   CREATININE 0.70 08/06/2018  BILITOT 0.9 07/30/2018   ALKPHOS 66 07/30/2018   AST 24 07/30/2018   ALT 18 07/30/2018   PROT 7.2 07/30/2018   ALBUMIN 4.3 07/30/2018   CALCIUM 8.8 (L) 08/06/2018   GFRAA >60 08/06/2018    Speciality Comments: PLQ Eye Exam: 11/27/17 WNL @ [facility] Follow up in 1 year  Procedures:  No procedures performed Allergies: Lactose intolerance (gi); Antihistamines, chlorpheniramine-type; Other; and Tape   Assessment / Plan:     Visit Diagnoses: Rheumatoid arthritis of multiple sites with negative rheumatoid factor (HCC) - +RF, +CCP: She has tenderness or synovitis of several MCPs and PIP joints as described above.  Warmth of both knee joints noted.  She has been having increased pain in wrist joints, hands, knee joints, and ankle joints.  She has been off of Plaquenil for 1 year due to the concern for covid-19.  She will be restarting on plaquenil 200 mg 1 tablet by mouth twice daily Monday through Friday.  Indications, contraindications, and potential side effects of PLQ were discussed.  Consent was obtained.  A new prescription for PLQ will be sent to the pharmacy pending lab work.  She was advised to notify us if she cannot tolerate PLQ.  She will follow up  in 3 months.  Patient was counseled on the purpose, proper use, and adverse effects of hydroxychloroquine including nausea/diarrhea, skin rash, headaches, and sun sensitivity.  Discussed importance of annual eye exams while on hydroxychloroquine to monitor to ocular toxicity and discussed importance of frequent laboratory monitoring.  Provided patient with eye exam form for baseline ophthalmologic exam.  Provided patient with educational materials on hydroxychloroquine and answered all questions.  Patient consented to hydroxychloroquine.  Will upload consent in the media tab.    Dose will be Plaquenil 200 mg twice daily Monday through Friday.  Prescription pending lab results.  High risk medication use - PLQ - Plan: CBC with Differential/Platelet, COMPLETE METABOLIC PANEL WITH GFR  Primary osteoarthritis of both hands: She has PIP and DIP thickening consistent with osteoarthritis of both hands.  She has been having increased pain and stiffness in both hands.  Joint protection and muscle strengthening were discussed.  Primary osteoarthritis of right knee: She has been experiencing increased pain in the right knee joint.  She has good range of motion on exam.  Warmth of the right knee joint was noted.  History of total knee replacement, left - Dr. Mayer Camel: She did having increased discomfort in the left knee replacement recently.  She is good range of motion on exam.  Warmth of the left knee replacement noted.  Primary osteoarthritis of both feet: She has been experiencing discomfort in both ankle joints but no synovitis was noted.  She has pitting edema bilaterally.   Other medical conditions are listed as follows:  History of hyperlipidemia  History of hypertension  Orders: Orders Placed This Encounter  Procedures  . CBC with Differential/Platelet  . COMPLETE METABOLIC PANEL WITH GFR   No orders of the defined types were placed in this encounter.     Follow-Up Instructions: Return in  about 3 months (around 03/20/2020) for Rheumatoid arthritis, Osteoarthritis.   Hazel Sams, PA-C  I examined and evaluated the patient with Hazel Sams PA.  Patient has been experiencing increased pain and swelling in her joints on my exam.  She has been off Plaquenil for a while.  Today after detailed discussion with patient and her daughter we decided to start her on Plaquenil again.  The  plan of care was discussed as noted above.  Bo Merino, MD   Note - This record has been created using Editor, commissioning.  Chart creation errors have been sought, but may not always  have been located. Such creation errors do not reflect on  the standard of medical care.

## 2019-12-19 ENCOUNTER — Telehealth: Payer: Self-pay

## 2019-12-19 ENCOUNTER — Ambulatory Visit (INDEPENDENT_AMBULATORY_CARE_PROVIDER_SITE_OTHER): Payer: Medicare HMO | Admitting: Rheumatology

## 2019-12-19 ENCOUNTER — Encounter: Payer: Self-pay | Admitting: Rheumatology

## 2019-12-19 ENCOUNTER — Other Ambulatory Visit: Payer: Self-pay

## 2019-12-19 VITALS — BP 134/77 | HR 95 | Resp 17 | Ht 59.0 in | Wt 172.0 lb

## 2019-12-19 DIAGNOSIS — Z8639 Personal history of other endocrine, nutritional and metabolic disease: Secondary | ICD-10-CM | POA: Diagnosis not present

## 2019-12-19 DIAGNOSIS — M19042 Primary osteoarthritis, left hand: Secondary | ICD-10-CM

## 2019-12-19 DIAGNOSIS — Z8679 Personal history of other diseases of the circulatory system: Secondary | ICD-10-CM

## 2019-12-19 DIAGNOSIS — Z96652 Presence of left artificial knee joint: Secondary | ICD-10-CM

## 2019-12-19 DIAGNOSIS — M19071 Primary osteoarthritis, right ankle and foot: Secondary | ICD-10-CM

## 2019-12-19 DIAGNOSIS — M19041 Primary osteoarthritis, right hand: Secondary | ICD-10-CM | POA: Diagnosis not present

## 2019-12-19 DIAGNOSIS — M0609 Rheumatoid arthritis without rheumatoid factor, multiple sites: Secondary | ICD-10-CM

## 2019-12-19 DIAGNOSIS — M19072 Primary osteoarthritis, left ankle and foot: Secondary | ICD-10-CM

## 2019-12-19 DIAGNOSIS — Z79899 Other long term (current) drug therapy: Secondary | ICD-10-CM

## 2019-12-19 DIAGNOSIS — M1711 Unilateral primary osteoarthritis, right knee: Secondary | ICD-10-CM

## 2019-12-19 NOTE — Patient Instructions (Signed)
Standing Labs We placed an order today for your standing lab work.    Please come back and get your standing labs in 1 month then every 5 months  We have open lab daily Monday through Thursday from 8:30-12:30 PM and 1:30-4:30 PM and Friday from 8:30-12:30 PM and 1:30-4:00 PM at the office of Dr. Bo Merino.   You may experience shorter wait times on Monday and Friday afternoons. The office is located at [address], [place], [place], [place] [address] No appointment is necessary.   Labs are drawn by [facility].  You may receive a bill from [facility] for your lab work.  If you wish to have your labs drawn at another location, please call the office 24 hours in advance to send orders.  If you have any questions regarding directions or hours of operation,  please call [phone].   Just as a reminder please drink plenty of water prior to coming for your lab work. Thanks!    Hydroxychloroquine tablets What is this medicine? HYDROXYCHLOROQUINE (hye drox ee KLOR oh kwin) is used to treat rheumatoid arthritis and systemic lupus erythematosus. It is also used to treat malaria. This medicine may be used for other purposes; ask your health care provider or pharmacist if you have questions. COMMON BRAND NAME(S): Plaquenil, Quineprox What should I tell my health care provider before I take this medicine? They need to know if you have any of these conditions:  diabetes  eye disease, vision problems  G6PD deficiency  heart disease  history of irregular heartbeat  if you often drink alcohol  kidney disease  liver disease  porphyria  psoriasis  an unusual or allergic reaction to chloroquine, hydroxychloroquine, other medicines, foods, dyes, or preservatives  pregnant or trying to get pregnant  breast-feeding How should I use this medicine? Take this medicine by mouth with a glass of water. Follow the directions on the prescription label. Do not cut, crush or chew this  medicine. Swallow the tablets whole. Take this medicine with food. Avoid taking antacids within 4 hours of taking this medicine. It is best to separate these medicines by at least 4 hours. Take your medicine at regular intervals. Do not take it more often than directed. Take all of your medicine as directed even if you think you are better. Do not skip doses or stop your medicine early. Talk to your pediatrician regarding the use of this medicine in children. While this drug may be prescribed for selected conditions, precautions do apply. Overdosage: If you think you have taken too much of this medicine contact a poison control center or emergency room at once. NOTE: This medicine is only for you. Do not share this medicine with others. What if I miss a dose? If you miss a dose, take it as soon as you can. If it is almost time for your next dose, take only that dose. Do not take double or extra doses. What may interact with this medicine? Do not take this medicine with any of the following medications:  cisapride  dronedarone  pimozide  thioridazine This medicine may also interact with the following medications:  ampicillin  antacids  cimetidine  cyclosporine  digoxin  kaolin  medicines for diabetes, like insulin, glipizide, glyburide  medicines for seizures like carbamazepine, phenobarbital, phenytoin  mefloquine  methotrexate  other medicines that prolong the QT interval (cause an abnormal heart rhythm)  praziquantel This list may not describe all possible interactions. Give your health care provider a list  of all the medicines, herbs, non-prescription drugs, or dietary supplements you use. Also tell them if you smoke, drink alcohol, or use illegal drugs. Some items may interact with your medicine. What should I watch for while using this medicine? Visit your health care professional for regular checks on your progress. Tell your health care professional if your symptoms  do not start to get better or if they get worse. You may need blood work done while you are taking this medicine. If you take other medicines that can affect heart rhythm, you may need more testing. Talk to your health care professional if you have questions. Your vision may be tested before and during use of this medicine. Tell your health care professional right away if you have any change in your eyesight. What side effects may I notice from receiving this medicine? Side effects that you should report to your doctor or health care professional as soon as possible:  allergic reactions like skin rash, itching or hives, swelling of the face, lips, or tongue  changes in vision  decreased hearing or ringing of the ears  muscle weakness  redness, blistering, peeling or loosening of the skin, including inside the mouth  sensitivity to light  signs and symptoms of a dangerous change in heartbeat or heart rhythm like chest pain; dizziness; fast or irregular heartbeat; palpitations; feeling faint or lightheaded, falls; breathing problems  signs and symptoms of liver injury like dark yellow or brown urine; general ill feeling or flu-like symptoms; light-colored stools; loss of appetite; nausea; right upper belly pain; unusually weak or tired; yellowing of the eyes or skin  signs and symptoms of low blood sugar such as feeling anxious; confusion; dizziness; increased hunger; unusually weak or tired; sweating; shakiness; cold; irritable; headache; blurred vision; fast heartbeat; loss of consciousness  suicidal thoughts  uncontrollable head, mouth, neck, arm, or leg movements Side effects that usually do not require medical attention (report to your doctor or health care professional if they continue or are bothersome):  diarrhea  dizziness  hair loss  headache  irritable  loss of appetite  nausea, vomiting  stomach pain This list may not describe all possible side effects. Call your  doctor for medical advice about side effects. You may report side effects to FDA at 1-800-FDA-1088. Where should I keep my medicine? Keep out of the reach of children. Store at room temperature between 15 and 30 degrees C (59 and 86 degrees F). Protect from moisture and light. Throw away any unused medicine after the expiration date. NOTE: This sheet is a summary. It may not cover all possible information. If you have questions about this medicine, talk to your doctor, pharmacist, or health care provider.  2020 Elsevier/Gold Standard (2018-12-09 12:56:32)

## 2019-12-19 NOTE — Telephone Encounter (Signed)
Please refill plaquenil pending lab results. Dose will be Plaquenil 200 mg twice daily Monday through Friday, per Hazel Sams, PA-C.

## 2019-12-20 LAB — CBC WITH DIFFERENTIAL/PLATELET
Absolute Monocytes: 605 cells/uL (ref 200–950)
Basophils Absolute: 42 cells/uL (ref 0–200)
Basophils Relative: 1 %
Eosinophils Absolute: 302 cells/uL (ref 15–500)
Eosinophils Relative: 7.2 %
HCT: 40.8 % (ref 35.0–45.0)
Hemoglobin: 13.5 g/dL (ref 11.7–15.5)
Lymphs Abs: 1550 cells/uL (ref 850–3900)
MCH: 31.4 pg (ref 27.0–33.0)
MCHC: 33.1 g/dL (ref 32.0–36.0)
MCV: 94.9 fL (ref 80.0–100.0)
MPV: 10.4 fL (ref 7.5–12.5)
Monocytes Relative: 14.4 %
Neutro Abs: 1701 cells/uL (ref 1500–7800)
Neutrophils Relative %: 40.5 %
Platelets: 221 10*3/uL (ref 140–400)
RBC: 4.3 10*6/uL (ref 3.80–5.10)
RDW: 14.6 % (ref 11.0–15.0)
Total Lymphocyte: 36.9 %
WBC: 4.2 10*3/uL (ref 3.8–10.8)

## 2019-12-20 LAB — COMPLETE METABOLIC PANEL WITH GFR
AG Ratio: 1.5 (calc) (ref 1.0–2.5)
ALT: 11 U/L (ref 6–29)
AST: 17 U/L (ref 10–35)
Albumin: 4 g/dL (ref 3.6–5.1)
Alkaline phosphatase (APISO): 70 U/L (ref 37–153)
BUN: 19 mg/dL (ref 7–25)
CO2: 28 mmol/L (ref 20–32)
Calcium: 10.5 mg/dL — ABNORMAL HIGH (ref 8.6–10.4)
Chloride: 103 mmol/L (ref 98–110)
Creat: 0.75 mg/dL (ref 0.60–0.93)
GFR, Est African American: 89 mL/min/{1.73_m2} (ref >=60)
GFR, Est Non African American: 77 mL/min/{1.73_m2} (ref >=60)
Globulin: 2.6 g/dL (calc) (ref 1.9–3.7)
Glucose, Bld: 94 mg/dL (ref 65–99)
Potassium: 3.9 mmol/L (ref 3.5–5.3)
Sodium: 141 mmol/L (ref 135–146)
Total Bilirubin: 0.4 mg/dL (ref 0.2–1.2)
Total Protein: 6.6 g/dL (ref 6.1–8.1)

## 2019-12-22 MED ORDER — HYDROXYCHLOROQUINE SULFATE 200 MG PO TABS: ORAL_TABLET | ORAL | 0 refills | Status: DC

## 2019-12-22 NOTE — Telephone Encounter (Signed)
Labs: 12/19/2019 Calcium is borderline elevated. Please advise the patient to hold calcium and vitamin D supplement. We will continue to monitor. Rest of CMP WNL. CBC WNL.    Dose per office note on 12/19/2019, plaquenil 200 mg 1 tablet by mouth twice daily Monday through Friday.   Okay to refill plaquenil per Hazel Sams, PA-C.

## 2019-12-22 NOTE — Progress Notes (Signed)
Calcium is borderline elevated.  Please advise the patient to hold calcium and vitamin D supplement.  We will continue to monitor.  Rest of CMP WNL. CBC WNL.

## 2019-12-25 DIAGNOSIS — R32 Unspecified urinary incontinence: Secondary | ICD-10-CM | POA: Diagnosis not present

## 2019-12-25 DIAGNOSIS — M069 Rheumatoid arthritis, unspecified: Secondary | ICD-10-CM | POA: Diagnosis not present

## 2019-12-25 DIAGNOSIS — D849 Immunodeficiency, unspecified: Secondary | ICD-10-CM | POA: Diagnosis not present

## 2019-12-25 DIAGNOSIS — Z6832 Body mass index (BMI) 32.0-32.9, adult: Secondary | ICD-10-CM | POA: Diagnosis not present

## 2020-01-12 DIAGNOSIS — M069 Rheumatoid arthritis, unspecified: Secondary | ICD-10-CM | POA: Diagnosis not present

## 2020-01-12 DIAGNOSIS — R7303 Prediabetes: Secondary | ICD-10-CM | POA: Diagnosis not present

## 2020-01-12 DIAGNOSIS — I10 Essential (primary) hypertension: Secondary | ICD-10-CM | POA: Diagnosis not present

## 2020-01-12 DIAGNOSIS — D849 Immunodeficiency, unspecified: Secondary | ICD-10-CM | POA: Diagnosis not present

## 2020-02-23 ENCOUNTER — Other Ambulatory Visit: Payer: Self-pay | Admitting: Pharmacist

## 2020-02-23 DIAGNOSIS — Z79899 Other long term (current) drug therapy: Secondary | ICD-10-CM

## 2020-02-23 NOTE — Progress Notes (Signed)
Patient presented for labs and no orders in place.  She is on Plaquenil. CBC/CMP order placed.  Standing orders placed.   Mariella Saa, PharmD, [place], CPP Clinical Specialty Pharmacist (Rheumatology and Pulmonology)  02/23/2020 11:52 AM

## 2020-02-24 LAB — COMPLETE METABOLIC PANEL WITH GFR
AG Ratio: 1.5 (calc) (ref 1.0–2.5)
ALT: 13 U/L (ref 6–29)
AST: 18 U/L (ref 10–35)
Albumin: 4.1 g/dL (ref 3.6–5.1)
Alkaline phosphatase (APISO): 79 U/L (ref 37–153)
BUN: 18 mg/dL (ref 7–25)
CO2: 27 mmol/L (ref 20–32)
Calcium: 10.4 mg/dL (ref 8.6–10.4)
Chloride: 104 mmol/L (ref 98–110)
Creat: 0.81 mg/dL (ref 0.60–0.93)
GFR, Est African American: 81 mL/min/{1.73_m2} (ref >=60)
GFR, Est Non African American: 70 mL/min/{1.73_m2} (ref >=60)
Globulin: 2.8 g/dL (calc) (ref 1.9–3.7)
Glucose, Bld: 87 mg/dL (ref 65–99)
Potassium: 4.3 mmol/L (ref 3.5–5.3)
Sodium: 140 mmol/L (ref 135–146)
Total Bilirubin: 0.4 mg/dL (ref 0.2–1.2)
Total Protein: 6.9 g/dL (ref 6.1–8.1)

## 2020-02-24 LAB — CBC WITH DIFFERENTIAL/PLATELET
Absolute Monocytes: 610 {cells}/uL (ref 200–950)
Basophils Absolute: 50 {cells}/uL (ref 0–200)
Basophils Relative: 1 %
Eosinophils Absolute: 190 {cells}/uL (ref 15–500)
Eosinophils Relative: 3.8 %
HCT: 42 % (ref 35.0–45.0)
Hemoglobin: 14.1 g/dL (ref 11.7–15.5)
Lymphs Abs: 1775 {cells}/uL (ref 850–3900)
MCH: 33 pg (ref 27.0–33.0)
MCHC: 33.6 g/dL (ref 32.0–36.0)
MCV: 98.4 fL (ref 80.0–100.0)
MPV: 9.6 fL (ref 7.5–12.5)
Monocytes Relative: 12.2 %
Neutro Abs: 2375 {cells}/uL (ref 1500–7800)
Neutrophils Relative %: 47.5 %
Platelets: 238 10*3/uL (ref 140–400)
RBC: 4.27 Million/uL (ref 3.80–5.10)
RDW: 12.6 % (ref 11.0–15.0)
Total Lymphocyte: 35.5 %
WBC: 5 10*3/uL (ref 3.8–10.8)

## 2020-02-24 NOTE — Progress Notes (Signed)
CBC and CMP normal

## 2020-03-09 NOTE — Progress Notes (Deleted)
Office Visit Note  Patient: Ann Gamble             Date of Birth: 1940-09-10           MRN: [id number]             PCP: Maryella Shivers, MD Referring: Maryella Shivers, MD Visit Date: 03/23/2020 Occupation: @GUAROCC @  Subjective:  No chief complaint on file.   History of Present Illness: Ann Gamble is a 79 y.o. female ***   Activities of Daily Living:  Patient reports morning stiffness for *** {minute/hour:19697}.   Patient {ACTIONS;DENIES/REPORTS:21021675::"Denies"} nocturnal pain.  Difficulty dressing/grooming: {ACTIONS;DENIES/REPORTS:21021675::"Denies"} Difficulty climbing stairs: {ACTIONS;DENIES/REPORTS:21021675::"Denies"} Difficulty getting out of chair: {ACTIONS;DENIES/REPORTS:21021675::"Denies"} Difficulty using hands for taps, buttons, cutlery, and/or writing: {ACTIONS;DENIES/REPORTS:21021675::"Denies"}  No Rheumatology ROS completed.   PMFS History:  Patient Active Problem List   Diagnosis Date Noted  . 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 Rheumatoid arthritis of multiple sites with negative rheumatoid factor ([place]) 09/04/2016  . High risk medication use 09/04/2016  . Primary osteoarthritis of right knee 09/04/2016  . History of total knee replacement, left 09/04/2016  . Primary osteoarthritis of both hands 09/04/2016  . Primary osteoarthritis of both feet 09/04/2016  . Essential hypertension 09/04/2016  . Dyslipidemia 09/04/2016    Past Medical History:  Diagnosis Date  . Bilateral carpal tunnel syndrome   . Cataract immature    right  . Constipation    takes stool softener nightly  . Foot deformity, bilateral   . GERD (gastroesophageal reflux disease)   . Hx of colonic polyps   . Hyperlipidemia    takes Simvastatin daily  . Hypertension    takes Hyzaar and Amlodipine daily  . OA (osteoarthritis)   . RA (rheumatoid arthritis) ([place])   . Seasonal allergies   . Sensitive skin    pt states she has highly sensitive skin  . Urinary  incontinence     Family History  Problem Relation Age of Onset  . Thyroid disease Mother   . Alzheimer's disease Mother   . Stroke Brother   . Diabetes Son   . Anesthesia problems Neg Hx   . Hypotension Neg Hx   . Pseudochol deficiency Neg Hx   . Malignant hyperthermia Neg Hx    Past Surgical History:  Procedure Laterality Date  . CATARACT EXTRACTION Bilateral   . COLONOSCOPY    . DENTAL SURGERY    . TONSILLECTOMY  1960  . TOTAL KNEE ARTHROPLASTY  10/30/2011   Procedure: TOTAL KNEE ARTHROPLASTY;  Surgeon: Kerin Salen, MD;  Location: [facility];  Service: Orthopedics;  Laterality: Left;  DEPUY/SIGMA  . TOTAL KNEE ARTHROPLASTY Right 08/05/2018   Procedure: TOTAL KNEE ARTHROPLASTY;  Surgeon: Vickey Huger, MD;  Location: [facility];  Service: Orthopedics;  Laterality: Right;  Marland Kitchen VAGINAL HYSTERECTOMY  1970's   partial   Social History   Social History Narrative  . Not on file    There is no immunization history on file for this patient.   Objective: Vital Signs: There were no vitals taken for this visit.   Physical Exam   Musculoskeletal Exam: ***  CDAI Exam: CDAI Score: -- Patient Global: --; Provider Global: -- Swollen: --; Tender: -- Joint Exam 03/23/2020   No joint exam has been documented for this visit   There is currently no information documented on the homunculus. Go to the Rheumatology activity and complete the homunculus joint exam.  Investigation: No additional findings.  Imaging: No results found.  Recent  Labs: Lab Results  Component Value Date   WBC 5.0 02/23/2020   HGB 14.1 02/23/2020   PLT 238 02/23/2020   NA 140 02/23/2020   K 4.3 02/23/2020   CL 104 02/23/2020   CO2 27 02/23/2020   GLUCOSE 87 02/23/2020   BUN 18 02/23/2020   CREATININE 0.81 02/23/2020   BILITOT 0.4 02/23/2020   ALKPHOS 66 07/30/2018   AST 18 02/23/2020   ALT 13 02/23/2020   PROT 6.9 02/23/2020   ALBUMIN 4.3 07/30/2018   CALCIUM 10.4 02/23/2020   GFRAA 81 02/23/2020     Speciality Comments: PLQ Eye Exam: 11/27/17 WNL @ [facility] Follow up in 1 year  Procedures:  No procedures performed Allergies: Lactose intolerance (gi); Antihistamines, chlorpheniramine-type; Other; and Tape   Assessment / Plan:     Visit Diagnoses: No diagnosis found.  Orders: No orders of the defined types were placed in this encounter.  No orders of the defined types were placed in this encounter.   Face-to-face time spent with patient was *** minutes. Greater than 50% of time was spent in counseling and coordination of care.  Follow-Up Instructions: No follow-ups on file.   Earnestine Mealing, CMA  Note - This record has been created using Editor, commissioning.  Chart creation errors have been sought, but may not always  have been located. Such creation errors do not reflect on  the standard of medical care.

## 2020-03-13 ENCOUNTER — Other Ambulatory Visit: Payer: Self-pay | Admitting: Physician Assistant

## 2020-03-13 DIAGNOSIS — M0609 Rheumatoid arthritis without rheumatoid factor, multiple sites: Secondary | ICD-10-CM

## 2020-03-15 ENCOUNTER — Other Ambulatory Visit: Payer: Self-pay | Admitting: Physician Assistant

## 2020-03-15 DIAGNOSIS — M0609 Rheumatoid arthritis without rheumatoid factor, multiple sites: Secondary | ICD-10-CM

## 2020-03-15 NOTE — Telephone Encounter (Signed)
Last Visit: 12/19/2019 Next Visit: 03/23/2020 Labs: 5/7/2021Calcium is borderline elevated. Please advise the patient to hold calcium and vitamin D supplement. We will continue to monitor. Rest of CMP WNL. CBC WNL. Eye exam:11/27/17 WNL  Current Dose per office note 12/19/2019: plaquenil 200 mg 1 tablet by mouth twice daily Monday through Friday. DX:Rheumatoid arthritis of multiple sites with negative rheumatoid factor  Patient advised she is due to update PLQ Eye Exam. Patient states she has an appointment scheduled 04/30/2020.   Okay to refill Plaquenil? 

## 2020-03-23 ENCOUNTER — Ambulatory Visit: Payer: Medicare HMO | Admitting: Physician Assistant

## 2020-04-01 NOTE — Progress Notes (Signed)
Office Visit Note  Patient: Ann Gamble             Date of Birth: [birth year]           MRN: [id number]             PCP: Maryella Shivers, MD Referring: Maryella Shivers, MD Visit Date: 04/14/2020 Occupation: @GUAROCC @  Subjective:  Pain in both knee joints   History of Present Illness: Ann Gamble is a 79 y.o. female with history of seronegative rheumatoid arthritis and osteoarthritis.  Patient is taking Plaquenil 200 mg 1 tablet by mouth twice daily Monday through Friday.  She has been tolerating Plaquenil since restarting.  She has noticed an improvement in her hand pain and stiffness since restarting Plaquenil.  She continues to have chronic pain in both knees which are replaced.  Patient reports that she has noticed increased lower extremity edema and will be following up with her PCP for further evaluation.  Activities of Daily Living:  Patient reports morning stiffness for several  minutes.   Patient Reports nocturnal pain.  Difficulty dressing/grooming: Denies Difficulty climbing stairs: Reports Difficulty getting out of chair: Reports Difficulty using hands for taps, buttons, cutlery, and/or writing: Reports  Review of Systems  Constitutional: Negative for fatigue.  HENT: Negative for mouth sores, mouth dryness and nose dryness.   Eyes: Negative for itching and dryness.  Respiratory: Positive for shortness of breath. Negative for difficulty breathing.   Cardiovascular: Negative for chest pain and palpitations.  Gastrointestinal: Positive for constipation. Negative for blood in stool and diarrhea.  Endocrine: Negative for increased urination.  Genitourinary: Negative for difficulty urinating.  Musculoskeletal: Positive for arthralgias, joint pain, joint swelling and morning stiffness. Negative for myalgias, muscle tenderness and myalgias.  Skin: Positive for color change and rash.  Allergic/Immunologic: Negative for susceptible to infections.  Neurological:  Positive for dizziness. Negative for numbness, headaches, memory loss and weakness.  Hematological: Negative for bruising/bleeding tendency.  Psychiatric/Behavioral: Negative for confusion.    PMFS History:  Patient Active Problem List   Diagnosis Date Noted  . 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 Rheumatoid arthritis of multiple sites with negative rheumatoid factor ([place]) 09/04/2016  . High risk medication use 09/04/2016  . Primary osteoarthritis of right knee 09/04/2016  . History of total knee replacement, left 09/04/2016  . Primary osteoarthritis of both hands 09/04/2016  . Primary osteoarthritis of both feet 09/04/2016  . Essential hypertension 09/04/2016  . Dyslipidemia 09/04/2016    Past Medical History:  Diagnosis Date  . Bilateral carpal tunnel syndrome   . Cataract immature    right  . Constipation    takes stool softener nightly  . Foot deformity, bilateral   . GERD (gastroesophageal reflux disease)   . Hx of colonic polyps   . Hyperlipidemia    takes Simvastatin daily  . Hypertension    takes Hyzaar and Amlodipine daily  . OA (osteoarthritis)   . RA (rheumatoid arthritis) ([place])   . Seasonal allergies   . Sensitive skin    pt states she has highly sensitive skin  . Urinary incontinence     Family History  Problem Relation Age of Onset  . Thyroid disease Mother   . Alzheimer's disease Mother   . Stroke Brother   . Diabetes Son   . Anesthesia problems Neg Hx   . Hypotension Neg Hx   . Pseudochol deficiency Neg Hx   . Malignant hyperthermia Neg Hx    Past Surgical History:  Procedure Laterality Date  . CATARACT EXTRACTION Bilateral   . COLONOSCOPY    . DENTAL SURGERY    . TONSILLECTOMY  1960  . TOTAL KNEE ARTHROPLASTY  10/30/2011   Procedure: TOTAL KNEE ARTHROPLASTY;  Surgeon: Kerin Salen, MD;  Location: [facility];  Service: Orthopedics;  Laterality: Left;  DEPUY/SIGMA  . TOTAL KNEE ARTHROPLASTY Right 08/05/2018   Procedure: TOTAL KNEE ARTHROPLASTY;   Surgeon: Vickey Huger, MD;  Location: [facility];  Service: Orthopedics;  Laterality: Right;  Marland Kitchen VAGINAL HYSTERECTOMY  1970's   partial   Social History   Social History Narrative  . Not on file   Immunization History  Administered Date(s) Administered  . Moderna SARS-COVID-2 Vaccination 09/18/2019, 10/14/2019     Objective: Vital Signs: BP 129/72 (BP Location: Left Arm, Patient Position: Sitting, Cuff Size: Normal)   Pulse 94   Resp 17   Ht 4\' 11"  (1.499 m)   Wt 172 lb (78 kg)   BMI 34.74 kg/m    Physical Exam Vitals and nursing note reviewed.  Constitutional:      Appearance: She is well-developed.  HENT:     Head: Normocephalic and atraumatic.  Eyes:     Conjunctiva/sclera: Conjunctivae normal.  Pulmonary:     Effort: Pulmonary effort is normal.  Abdominal:     Palpations: Abdomen is soft.  Musculoskeletal:     Cervical back: Normal range of motion.  Lymphadenopathy:     Cervical: No cervical adenopathy.  Skin:    General: Skin is warm and dry.     Capillary Refill: Capillary refill takes less than 2 seconds.  Neurological:     Mental Status: She is alert and oriented to person, place, and time.  Psychiatric:        Behavior: Behavior normal.      Musculoskeletal Exam: C-spine, thoracic spine, and lumbar spine good ROM.  Shoulder joints good ROM with no discomfort.  Elbow joints good ROM with no tenderness or inflammation. Thickening of both wrist joints but no tenderness or synovitis noted.  Thickening of all PIP joints.  No tenderness or synovitis of MCP joints.  No synovitis noted on exam.  Complete fist formation noted.  Hip joints good ROM with no discomfort.  Bilateral knee replacements have good ROM with some discomfort bilaterally.  Ankle joints good ROM with no tenderness.  Pedal edema noted bilaterally, L>R.    CDAI Exam: CDAI Score: -- Patient Global: --; Provider Global: -- Swollen: --; Tender: -- Joint Exam 04/14/2020   No joint exam has been  documented for this visit   There is currently no information documented on the homunculus. Go to the Rheumatology activity and complete the homunculus joint exam.  Investigation: No additional findings.  Imaging: No results found.  Recent Labs: Lab Results  Component Value Date   WBC 5.0 02/23/2020   HGB 14.1 02/23/2020   PLT 238 02/23/2020   NA 140 02/23/2020   K 4.3 02/23/2020   CL 104 02/23/2020   CO2 27 02/23/2020   GLUCOSE 87 02/23/2020   BUN 18 02/23/2020   CREATININE 0.81 02/23/2020   BILITOT 0.4 02/23/2020   ALKPHOS 66 07/30/2018   AST 18 02/23/2020   ALT 13 02/23/2020   PROT 6.9 02/23/2020   ALBUMIN 4.3 07/30/2018   CALCIUM 10.4 02/23/2020   GFRAA 81 02/23/2020    Speciality Comments: PLQ Eye Exam: 11/27/17 WNL @ [facility] Follow up in 1 year  Procedures:  No procedures performed Allergies: Lactose intolerance (  gi); Antihistamines, chlorpheniramine-type; Other; and Tape   Assessment / Plan:     Visit Diagnoses: 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  She has noticed clinical improvement since restarting on Plaquenil.  She has been taking Plaquenil 200 mg 1 tablet by mouth twice daily Monday through Friday and has been tolerating it without any side effects.  She has not had any recent infections.  She has noticed less discomfort and stiffness in her hands and wrist joints.  She continues to have chronic pain in both knee joints which were replaced.  She was advised to follow-up with her orthopedic surgeon.  We discussed that her rheumatoid arthritis seems well controlled on the current dose of Plaquenil and she does not need more aggressive immunosuppression at this time.  She was advised to notify us if she develops increased joint pain and joint swelling.  She will follow-up in the office in 5 months.  High risk medication use - Plaquenil 200 mg 1 tablet by  mouth twice daily Monday through Friday.  She has a Plaquenil eye exam scheduled for 04/30/2020.  She was given a Plaquenil eye exam form to take with her to her upcoming appointment.  CBC and CMP were within normal limits on 02/23/2020.  She will be due to update lab work in December and every 5 months to monitor for drug toxicity. She has received both COVID-19 vaccinations and is planning to receive the third dose.  She was encouraged to continue to wear a mask and social distance.  She was advised to notify us if she develops a COVID-19 infection in order to receive the antibody infusion.  She voiced understanding.  Primary osteoarthritis of both hands: She has PIP and DIP thickening consistent with osteoarthritis of both hands. Complete fist formation bilaterally.    History of total right knee replacement: Chronic pain.  She is been experiencing increased discomfort in the right knee which is replaced.  She has good range of motion on examination today.  She was advised to follow-up with her orthopedic surgeon for further evaluation.  She may benefit from physical therapy.  History of total knee replacement, left - Dr. Mayer Camel: Chronic pain.  She has good range of motion on exam.  She has been experiencing increased lower extremity edema recently.  She is following up with her PCP soon for further evaluation.  She was advised to schedule appointment with her orthopedic surgeon if her left knee joint pain persists or worsens.  Primary osteoarthritis of both feet: She is not having any discomfort in her feet at this time.   Other medical conditions are listed as follows:   History of hypertension  History of hyperlipidemia  Orders: No orders of the defined types were placed in this encounter.  No orders of the defined types were placed in this encounter.   Face-to-face time spent with patient was 30 minutes. Greater than 50% of time was spent in counseling and coordination of  care.  Follow-Up Instructions: Return in about 5 months (around 09/14/2020) for Rheumatoid arthritis.   Ofilia Neas, PA-C  Note - This record has been created using Dragon software.  Chart creation errors have been sought, but may not always  have been located. Such creation errors do not reflect on  the standard of medical care.

## 2020-04-10 ENCOUNTER — Other Ambulatory Visit: Payer: Self-pay | Admitting: Physician Assistant

## 2020-04-10 DIAGNOSIS — M0609 Rheumatoid arthritis without rheumatoid factor, multiple sites: Secondary | ICD-10-CM

## 2020-04-12 NOTE — Telephone Encounter (Signed)
Last Visit: 12/19/2019 Next Visit: 03/23/2020 Labs: 5/7/2021Calcium is borderline elevated. Please advise the patient to hold calcium and vitamin D supplement. We will continue to monitor. Rest of CMP WNL. CBC WNL. Eye exam:11/27/17 WNL  Current Dose per office note 12/19/2019: plaquenil 200 mg 1 tablet by mouth twice daily Monday through Friday. [id number] arthritis of multiple sites with negative rheumatoid factor  Patient advised she is due to update PLQ Eye Exam. Patient states she has an appointment scheduled 04/30/2020.   Okay to refill Plaquenil?

## 2020-04-14 ENCOUNTER — Encounter: Payer: Self-pay | Admitting: Physician Assistant

## 2020-04-14 ENCOUNTER — Ambulatory Visit (INDEPENDENT_AMBULATORY_CARE_PROVIDER_SITE_OTHER): Payer: Medicare Other | Admitting: Physician Assistant

## 2020-04-14 ENCOUNTER — Other Ambulatory Visit: Payer: Self-pay

## 2020-04-14 VITALS — BP 129/72 | HR 94 | Resp 17 | Ht 59.0 in | Wt 172.0 lb

## 2020-04-14 DIAGNOSIS — Z8679 Personal history of other diseases of the circulatory system: Secondary | ICD-10-CM

## 2020-04-14 DIAGNOSIS — Z96652 Presence of left artificial knee joint: Secondary | ICD-10-CM

## 2020-04-14 DIAGNOSIS — Z96651 Presence of right artificial knee joint: Secondary | ICD-10-CM

## 2020-04-14 DIAGNOSIS — M1711 Unilateral primary osteoarthritis, right knee: Secondary | ICD-10-CM

## 2020-04-14 DIAGNOSIS — M19071 Primary osteoarthritis, right ankle and foot: Secondary | ICD-10-CM

## 2020-04-14 DIAGNOSIS — Z8639 Personal history of other endocrine, nutritional and metabolic disease: Secondary | ICD-10-CM

## 2020-04-14 DIAGNOSIS — M19042 Primary osteoarthritis, left hand: Secondary | ICD-10-CM

## 2020-04-14 DIAGNOSIS — M19041 Primary osteoarthritis, right hand: Secondary | ICD-10-CM

## 2020-04-14 DIAGNOSIS — M0609 Rheumatoid arthritis without rheumatoid factor, multiple sites: Secondary | ICD-10-CM

## 2020-04-14 DIAGNOSIS — Z79899 Other long term (current) drug therapy: Secondary | ICD-10-CM

## 2020-04-14 DIAGNOSIS — M19072 Primary osteoarthritis, left ankle and foot: Secondary | ICD-10-CM

## 2020-04-14 NOTE — Patient Instructions (Signed)
COVID-19 vaccine recommendations:   COVID-19 vaccine is recommended for everyone (unless you are allergic to a vaccine component), even if you are on a medication that suppresses your immune system.   If you are on Methotrexate, Cellcept (mycophenolate), Rinvoq, Morrie Sheldon, and Olumiant- hold the medication for 1 week after each vaccine. Hold Methotrexate for 2 weeks after the single dose COVID-19 vaccine.   If you are on Orencia subcutaneous injection - hold medication one week prior to and one week after the first COVID-19 vaccine dose (only).   If you are on Orencia IV infusions- time vaccination administration so that the first COVID-19 vaccination will occur four weeks after the infusion and postpone the subsequent infusion by one week.   If you are on Cyclophosphamide or Rituxan infusions please contact your doctor prior to receiving the COVID-19 vaccine.   Do not take Tylenol or any anti-inflammatory medications (NSAIDs) 24 hours prior to the COVID-19 vaccination.   There is no direct evidence about the efficacy of the COVID-19 vaccine in individuals who are on medications that suppress the immune system.   Even if you are fully vaccinated, and you are on any medications that suppress your immune system, please continue to wear a mask, maintain at least six feet social distance and practice hand hygiene.   If you develop a COVID-19 infection, please contact your PCP or our office to determine if you need antibody infusion.  The booster vaccine is now available for immunocompromised patients. It is advised that if you had Pfizer vaccine you should get [employer] booster.  If you had a Moderna vaccine then you should get a Moderna booster. Johnson and Wynetta Emery does not have a booster vaccine at this time.  Please see the following web sites for updated information.    https://[email or web address]/[email or web address]/0/Files/[email or web address]-Vaccination-Patient-Resources.pdf  [email or web address]://[email or web address]/About-[email or web address]-Releases/ID/1159  Standing Labs We placed an order today for your standing lab work.   Please have your standing labs drawn in  December and every 5 months   If possible, please have your labs drawn 2 weeks prior to your appointment so that the provider can discuss your results at your appointment.  We have open lab daily Monday through Thursday from 8:30-12:30 PM and 1:30-4:30 PM and Friday from 8:30-12:30 PM and 1:30-4:00 PM at the office of Dr. Bo Merino, [facility].   Please be advised, patients with office appointments requiring lab work will take precedents over walk-in lab work.  If possible, please come for your lab work on Monday and Friday afternoons, as you may experience shorter wait times. The office is located at [address] [place], [place], [place] [address] No appointment is necessary.   Labs are drawn by [facility]. Please bring your co-pay at the time of your lab draw.  You may receive a bill from [facility] for your lab work.  If you wish to have your labs drawn at another location, please call the office 24 hours in advance to send orders.  If you have any questions regarding directions or hours of operation,  please call [phone].   As a reminder, please drink plenty of water prior to coming for your lab work. Thanks!

## 2020-04-30 ENCOUNTER — Encounter: Payer: Self-pay | Admitting: Physician Assistant

## 2020-05-11 ENCOUNTER — Other Ambulatory Visit: Payer: Self-pay | Admitting: Physician Assistant

## 2020-05-11 DIAGNOSIS — M0609 Rheumatoid arthritis without rheumatoid factor, multiple sites: Secondary | ICD-10-CM

## 2020-05-11 NOTE — Telephone Encounter (Signed)
Last Visit: 04/14/2020 Next Visit: 09/15/2020 Labs: 02/23/2020 WNL Eye exam: 04/30/2020 WNL  Current Dose per office note 04/14/2020: Plaquenil 200 mg1 tablet by mouthtwice daily Monday through Friday.  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 Deveshwar  

## 2020-06-09 ENCOUNTER — Other Ambulatory Visit: Payer: Self-pay | Admitting: Rheumatology

## 2020-06-09 DIAGNOSIS — M0609 Rheumatoid arthritis without rheumatoid factor, multiple sites: Secondary | ICD-10-CM

## 2020-06-09 NOTE — Telephone Encounter (Signed)
Last Visit: 04/14/2020 Next Visit: 09/15/2020 Labs: 02/23/2020 WNL Eye exam: 04/30/2020 WNL  Current Dose per office note 04/14/2020: Plaquenil 200 mg1 tablet by mouthtwice daily Monday through Friday.  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 Estanislado Pandy

## 2020-08-27 ENCOUNTER — Other Ambulatory Visit: Payer: Self-pay | Admitting: Rheumatology

## 2020-08-27 DIAGNOSIS — M0609 Rheumatoid arthritis without rheumatoid factor, multiple sites: Secondary | ICD-10-CM

## 2020-08-27 NOTE — Telephone Encounter (Signed)
Last Visit: 04/14/2020 Next Visit: 09/15/2020 Labs: 02/23/2020, CBC and CMP normal, patient wants to wait until f/u appt 09/15/2020 to have lab performed, advised patient to have labs performed before appt if possible. Eye exam: 04/30/2020 WNL  - 6 months  Current Dose per office note 04/14/2020, Plaquenil 200 mg 1 tablet by mouth twice daily Monday through Friday [id number] arthritis of multiple sites with negative rheumatoid factor   Okay to refill Plaquenil?

## 2020-08-30 ENCOUNTER — Other Ambulatory Visit: Payer: Self-pay | Admitting: Rheumatology

## 2020-08-30 DIAGNOSIS — M0609 Rheumatoid arthritis without rheumatoid factor, multiple sites: Secondary | ICD-10-CM

## 2020-09-02 NOTE — Progress Notes (Signed)
Office Visit Note  Patient: Ann Gamble             Date of Birth: 1941/04/21           MRN: [id number]             PCP: Maryella Shivers, MD Referring: Maryella Shivers, MD Visit Date: 09/15/2020 Occupation: @GUAROCC @  Subjective:  Pain in both knee replacements   History of Present Illness: Ann Gamble is a 80 y.o. female with history of seronegative rheumatoid arthritis and osteoarthritis.  Patient is taking Plaquenil 200 mg 1 tablet by mouth twice daily Monday through Friday.  She tolerates Plaquenil without any side effects and has not missed any doses recently.  She continues to have chronic pain in both hands as well as both knee replacements.  She has not noticed any increased joint swelling in her hands recently.  She does have carpal tunnel of the right wrist but does not want to proceed with surgery at this time.  She wears her right carpal tunnel brace at times.  She has noticed some decreased grip strength in both hands.  She states that both her knee replacements have caused persistent discomfort since having a fall over 1 year ago.  She was evaluated by orthopedist who updated x-rays which were unremarkable.  She was encouraged to try physical therapy but she declined at that time.     Activities of Daily Living:  Patient reports morning stiffness for 24 hours.   Patient Denies nocturnal pain.  Difficulty dressing/grooming: Denies Difficulty climbing stairs: Reports Difficulty getting out of chair: Denies Difficulty using hands for taps, buttons, cutlery, and/or writing: Reports  Review of Systems  Constitutional: Positive for fatigue.  HENT: Positive for mouth dryness.   Eyes: Negative for dryness.  Respiratory: Positive for shortness of breath.   Cardiovascular: Positive for swelling in legs/feet.  Gastrointestinal: Negative for constipation.  Endocrine: Negative for excessive thirst.  Genitourinary: Negative for difficulty urinating.   Musculoskeletal: Positive for arthralgias, gait problem, joint pain, joint swelling, muscle weakness and morning stiffness.  Skin: Negative for rash.  Allergic/Immunologic: Negative for susceptible to infections.  Neurological: Positive for numbness.  Hematological: Negative for bruising/bleeding tendency.  Psychiatric/Behavioral: Positive for sleep disturbance.    PMFS History:  Patient Active Problem List   Diagnosis Date Noted  . 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 Rheumatoid arthritis of multiple sites with negative rheumatoid factor ([place]) 09/04/2016  . High risk medication use 09/04/2016  . Primary osteoarthritis of right knee 09/04/2016  . History of total knee replacement, left 09/04/2016  . Primary osteoarthritis of both hands 09/04/2016  . Primary osteoarthritis of both feet 09/04/2016  . Essential hypertension 09/04/2016  . Dyslipidemia 09/04/2016    Past Medical History:  Diagnosis Date  . Bilateral carpal tunnel syndrome   . Cataract immature    right  . Constipation    takes stool softener nightly  . Foot deformity, bilateral   . GERD (gastroesophageal reflux disease)   . Hx of colonic polyps   . Hyperlipidemia    takes Simvastatin daily  . Hypertension    takes Hyzaar and Amlodipine daily  . OA (osteoarthritis)   . RA (rheumatoid arthritis) ([place])   . Seasonal allergies   . Sensitive skin    pt states she has highly sensitive skin  . Urinary incontinence     Family History  Problem Relation Age of Onset  . Thyroid disease Mother   . Alzheimer's disease  Mother   . Stroke Brother   . Diabetes Son   . Anesthesia problems Neg Hx   . Hypotension Neg Hx   . Pseudochol deficiency Neg Hx   . Malignant hyperthermia Neg Hx    Past Surgical History:  Procedure Laterality Date  . CATARACT EXTRACTION Bilateral   . COLONOSCOPY    . DENTAL SURGERY    . TONSILLECTOMY  1960  . TOTAL KNEE ARTHROPLASTY  10/30/2011   Procedure: TOTAL KNEE ARTHROPLASTY;   Surgeon: Kerin Salen, MD;  Location: [place];  Service: Orthopedics;  Laterality: Left;  DEPUY/SIGMA  . TOTAL KNEE ARTHROPLASTY Right 08/05/2018   Procedure: TOTAL KNEE ARTHROPLASTY;  Surgeon: Vickey Huger, MD;  Location: [facility];  Service: Orthopedics;  Laterality: Right;  Marland Kitchen VAGINAL HYSTERECTOMY  1970's   partial   Social History   Social History Narrative  . Not on file   Immunization History  Administered Date(s) Administered  . Moderna Sars-Covid-2 Vaccination 09/18/2019, 10/14/2019, 07/19/2020     Objective: Vital Signs: BP 130/68 (BP Location: Left Arm, Patient Position: Sitting, Cuff Size: Normal)   Pulse 90   Resp 17   Ht 4\' 11"  (1.499 m)   Wt 173 lb (78.5 kg)   BMI 34.94 kg/m    Physical Exam Vitals and nursing note reviewed.  Constitutional:      Appearance: She is well-developed and well-nourished.  HENT:     Head: Normocephalic and atraumatic.  Eyes:     Extraocular Movements: EOM normal.     Conjunctiva/sclera: Conjunctivae normal.  Cardiovascular:     Pulses: Intact distal pulses.  Pulmonary:     Effort: Pulmonary effort is normal.  Abdominal:     Palpations: Abdomen is soft.  Musculoskeletal:     Cervical back: Normal range of motion.  Skin:    General: Skin is warm and dry.     Capillary Refill: Capillary refill takes less than 2 seconds.  Neurological:     Mental Status: She is alert and oriented to person, place, and time.  Psychiatric:        Mood and Affect: Mood and affect normal.        Behavior: Behavior normal.      Musculoskeletal Exam: C-spine, thoracic spine, lumbar spine have good range of motion with no discomfort.  No midline spinal tenderness.  No SI joint tenderness.  Shoulder joints, elbow joints, wrist joints have good range of motion with no synovitis.  No tenderness or synovitis of MCP joints.  She has synovial thickening of bilateral PIP and DIP joints consistent with osteoarthritis of both hands.  Limited range of motion of PIP  joints noted.  She has difficulty making a complete fist bilaterally.  Hip joints have good range of motion with no discomfort.  She has painful range of motion of bilateral knee replacements.  Synovial thickening of both ankle joints noted.   CDAI Exam: CDAI Score: - Patient Global: -; Provider Global: - Swollen: 0 ; Tender: 2  Joint Exam 09/15/2020      Right  Left  Knee   Tender   Tender     Investigation: No additional findings.  Imaging: No results found.  Recent Labs: Lab Results  Component Value Date   WBC 5.0 02/23/2020   HGB 14.1 02/23/2020   PLT 238 02/23/2020   NA 140 02/23/2020   K 4.3 02/23/2020   CL 104 02/23/2020   CO2 27 02/23/2020   GLUCOSE 87 02/23/2020   BUN 18 02/23/2020  CREATININE 0.81 02/23/2020   BILITOT 0.4 02/23/2020   ALKPHOS 66 07/30/2018   AST 18 02/23/2020   ALT 13 02/23/2020   PROT 6.9 02/23/2020   ALBUMIN 4.3 07/30/2018   CALCIUM 10.4 02/23/2020   GFRAA 81 02/23/2020    Speciality Comments: PLQ Eye Exam: 04/30/2020 WNL @ [facility] Follow up in 6 months  Procedures:  No procedures performed Allergies: Lactose intolerance (gi); Antihistamines, chlorpheniramine-type; Other; and Tape   Assessment / Plan:     Visit Diagnoses: Rheumatoid arthritis of multiple sites with negative rheumatoid factor (HCC) - +RF, +CCP: She has no synovitis on exam.  She has not had any recent rheumatoid arthritis flares.  She is clinically doing well taking Plaquenil 200 mg 1 tablet by mouth twice daily Monday through Friday.  She continues to tolerate Plaquenil without any side effects and has not missed any doses recently.  She continues to have chronic pain in both hands, both wrist joints, and both knee replacements.  She has severe PIP thickening and limited range of motion of all PIP joints on exam.  No tenderness or synovitis of MCP joints were noted.  She will continue taking Plaquenil as prescribed.  She does not need a refill at this time.  She was  advised to notify us if she develops increased joint pain or joint swelling.  She will follow-up in the office in 5 months.  High risk medication use - Plaquenil 200 mg 1 tablet by mouth twice daily Monday through Friday.  PLQ Eye Exam: 04/30/2020. CBC and CMP were within normal limits on 02/23/2020.  Orders for CBC and CMP were released today.  She will continue to require lab work every 5 months to monitor for drug toxicity.- Plan: CBC with Differential/Platelet, COMPLETE METABOLIC PANEL WITH GFR  Primary osteoarthritis of both hands: She has PIP and DIP thickening consistent with osteoarthritis of both hands.  Limited range of motion of PIP joints and subluxation of several DIP joints.  She has some difficulty making a complete fist bilaterally.  Joint protection and muscle strengthening were discussed. Dicussed the importance of performing hand exercises.    History of total right knee replacement: Chronic pain.  Declined physical therapy.    History of total knee replacement, left - Dr. Mayer Camel: Chronic pain.  She continues to have soft tissue pain after having a fall about 1 year ago.  She had updated x-rays by her orthopedist which were unremarkable according to the patient.  According to the patient she was offered physical therapy but she declined at that time.  She was encouraged to perform home exercises that were provided at physical therapy previously.  She was advised to follow-up with orthopedic surgeon if her discomfort persists or worsens.  Primary osteoarthritis of both feet: She has synovial thickening of both ankle joints.  No tenderness or inflammation noted.   Other medical conditions are listed as follows:  History of hyperlipidemia  History of hypertension  Orders: Orders Placed This Encounter  Procedures  . CBC with Differential/Platelet  . COMPLETE METABOLIC PANEL WITH GFR   No orders of the defined types were placed in this encounter.    Follow-Up Instructions:  Return in about 5 months (around 02/12/2021) for Rheumatoid arthritis, Osteoarthritis.   Ofilia Neas, PA-C  Note - This record has been created using Dragon software.  Chart creation errors have been sought, but may not always  have been located. Such creation errors do not reflect on  the standard  of medical care.

## 2020-09-15 ENCOUNTER — Other Ambulatory Visit: Payer: Self-pay

## 2020-09-15 ENCOUNTER — Encounter: Payer: Self-pay | Admitting: Physician Assistant

## 2020-09-15 ENCOUNTER — Ambulatory Visit (INDEPENDENT_AMBULATORY_CARE_PROVIDER_SITE_OTHER): Payer: Medicare Other | Admitting: Physician Assistant

## 2020-09-15 VITALS — BP 130/68 | HR 90 | Resp 17 | Ht 59.0 in | Wt 173.0 lb

## 2020-09-15 DIAGNOSIS — M19041 Primary osteoarthritis, right hand: Secondary | ICD-10-CM | POA: Diagnosis not present

## 2020-09-15 DIAGNOSIS — M0609 Rheumatoid arthritis without rheumatoid factor, multiple sites: Secondary | ICD-10-CM | POA: Diagnosis not present

## 2020-09-15 DIAGNOSIS — Z79899 Other long term (current) drug therapy: Secondary | ICD-10-CM | POA: Diagnosis not present

## 2020-09-15 DIAGNOSIS — M19071 Primary osteoarthritis, right ankle and foot: Secondary | ICD-10-CM

## 2020-09-15 DIAGNOSIS — Z8639 Personal history of other endocrine, nutritional and metabolic disease: Secondary | ICD-10-CM

## 2020-09-15 DIAGNOSIS — Z8679 Personal history of other diseases of the circulatory system: Secondary | ICD-10-CM

## 2020-09-15 DIAGNOSIS — M19072 Primary osteoarthritis, left ankle and foot: Secondary | ICD-10-CM

## 2020-09-15 DIAGNOSIS — Z96651 Presence of right artificial knee joint: Secondary | ICD-10-CM | POA: Diagnosis not present

## 2020-09-15 DIAGNOSIS — M19042 Primary osteoarthritis, left hand: Secondary | ICD-10-CM

## 2020-09-15 DIAGNOSIS — Z96652 Presence of left artificial knee joint: Secondary | ICD-10-CM

## 2020-09-16 LAB — CBC WITH DIFFERENTIAL/PLATELET
Absolute Monocytes: 779 cells/uL (ref 200–950)
Basophils Absolute: 48 cells/uL (ref 0–200)
Basophils Relative: 0.9 %
Eosinophils Absolute: 371 cells/uL (ref 15–500)
Eosinophils Relative: 7 %
HCT: 34.9 % — ABNORMAL LOW (ref 35.0–45.0)
Hemoglobin: 11.4 g/dL — ABNORMAL LOW (ref 11.7–15.5)
Lymphs Abs: 1919 cells/uL (ref 850–3900)
MCH: 30.9 pg (ref 27.0–33.0)
MCHC: 32.7 g/dL (ref 32.0–36.0)
MCV: 94.6 fL (ref 80.0–100.0)
MPV: 9.9 fL (ref 7.5–12.5)
Monocytes Relative: 14.7 %
Neutro Abs: 2184 cells/uL (ref 1500–7800)
Neutrophils Relative %: 41.2 %
Platelets: 280 10*3/uL (ref 140–400)
RBC: 3.69 10*6/uL — ABNORMAL LOW (ref 3.80–5.10)
RDW: 12.3 % (ref 11.0–15.0)
Total Lymphocyte: 36.2 %
WBC: 5.3 10*3/uL (ref 3.8–10.8)

## 2020-09-16 LAB — COMPLETE METABOLIC PANEL WITH GFR
AG Ratio: 1.7 (calc) (ref 1.0–2.5)
ALT: 15 U/L (ref 6–29)
AST: 21 U/L (ref 10–35)
Albumin: 4.3 g/dL (ref 3.6–5.1)
Alkaline phosphatase (APISO): 71 U/L (ref 37–153)
BUN: 25 mg/dL (ref 7–25)
CO2: 26 mmol/L (ref 20–32)
Calcium: 10.6 mg/dL — ABNORMAL HIGH (ref 8.6–10.4)
Chloride: 103 mmol/L (ref 98–110)
Creat: 0.82 mg/dL (ref 0.60–0.93)
GFR, Est African American: 79 mL/min/{1.73_m2} (ref >=60)
GFR, Est Non African American: 69 mL/min/{1.73_m2} (ref >=60)
Globulin: 2.5 g/dL (calc) (ref 1.9–3.7)
Glucose, Bld: 88 mg/dL (ref 65–99)
Potassium: 3.7 mmol/L (ref 3.5–5.3)
Sodium: 140 mmol/L (ref 135–146)
Total Bilirubin: 0.4 mg/dL (ref 0.2–1.2)
Total Protein: 6.8 g/dL (ref 6.1–8.1)

## 2020-09-16 NOTE — Progress Notes (Signed)
RBC count, hemoglobin, and hematocrit are low.   Hgb has dropped significantly.  Please notify the patient.  Please clarify if she has noticed any blood in her stool?  Please forward lab work to PCP for further evaluation.   Calcium is elevated-10.6. albumin WNL.  She is taking HCTZ which may be contributing to slight hypercalcemia.   Please forward to PCP.

## 2020-09-24 ENCOUNTER — Other Ambulatory Visit: Payer: Self-pay | Admitting: Physician Assistant

## 2020-09-24 DIAGNOSIS — M0609 Rheumatoid arthritis without rheumatoid factor, multiple sites: Secondary | ICD-10-CM

## 2020-09-24 NOTE — Telephone Encounter (Signed)
Last Visit: 09/15/2020 Next Visit: 02/16/2021  Labs: 09/15/2020 RBC count, hemoglobin, and hematocrit are low. Calcium is elevated-10.6. albumin WNL. Eye exam: 04/30/2020 WNL  Current Dose per office note 09/15/2020: Plaquenil 200 mg 1 tablet by mouth twice daily Monday through Friday.  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

## 2020-09-27 ENCOUNTER — Other Ambulatory Visit: Payer: Self-pay | Admitting: Physician Assistant

## 2020-09-27 DIAGNOSIS — M0609 Rheumatoid arthritis without rheumatoid factor, multiple sites: Secondary | ICD-10-CM

## 2020-12-01 NOTE — Progress Notes (Signed)
Office Visit Note  Patient: Ann Gamble             Date of Birth: 1940-10-23           MRN: [id number]             PCP: Maryella Shivers, MD Referring: Maryella Shivers, MD Visit Date: 12/03/2020 Occupation: @GUAROCC @  Subjective:  Pain in both knees   History of Present Illness: Ann Gamble is a 80 y.o. female with history of rheumatoid arthritis and osteoarthritis.  She continues to take plaquenil 200 mg 1 tablet by mouth twice daily Monday through Friday.  She continues to tolerate Plaquenil without any side effects.  She has not missed any doses recently.  She denies any recent rheumatoid arthritis flares.  She continues to have chronic pain in both knee replacements.  She has been using a cane or walker to assist with ambulation.  She was recently evaluated by Dr. Mayer Camel who offered PT, which the patient declined since she has been performing home exercises without any improvement in her symptoms.  She has tried using voltaren gel and tiger balm but did not notice any improvement in her symptoms.  She is unable to take NSAIDs due to reflux.  She states the pain has been radiating down both legs.  She has also noticed intermittent fluid retention and wears compression stockings which alleviates some of the tightness and burning in her legs. She denies any other joint pain or joint swelling at this time.      Activities of Daily Living:  Patient reports morning stiffness for several hours.   Patient Reports nocturnal pain.  Difficulty dressing/grooming: Denies Difficulty climbing stairs: Reports Difficulty getting out of chair: Denies Difficulty using hands for taps, buttons, cutlery, and/or writing: Reports  Review of Systems  Constitutional: Positive for fatigue.  HENT: Positive for mouth dryness. Negative for mouth sores and nose dryness.   Eyes: Negative for pain, itching, visual disturbance and dryness.  Respiratory: Positive for shortness of breath and  difficulty breathing. Negative for cough and hemoptysis.   Cardiovascular: Positive for swelling in legs/feet. Negative for chest pain and palpitations.  Gastrointestinal: Negative for abdominal pain, blood in stool, constipation and diarrhea.  Endocrine: Negative for increased urination.  Genitourinary: Negative for painful urination.  Musculoskeletal: Positive for arthralgias, joint pain, joint swelling, myalgias, morning stiffness and myalgias. Negative for muscle weakness and muscle tenderness.  Skin: Negative for color change, rash and redness.  Allergic/Immunologic: Negative for susceptible to infections.  Neurological: Positive for weakness. Negative for dizziness, numbness, headaches and memory loss.  Hematological: Negative for swollen glands.  Psychiatric/Behavioral: Positive for sleep disturbance. Negative for confusion.    PMFS History:  Patient Active Problem List   Diagnosis Date Noted  . 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 High risk medication use 09/04/2016  . Primary osteoarthritis of right knee 09/04/2016  . History of total knee replacement, left 09/04/2016  . Primary osteoarthritis of both hands 09/04/2016  . Primary osteoarthritis of both feet 09/04/2016  . Essential hypertension 09/04/2016  . Dyslipidemia 09/04/2016    Past Medical History:  Diagnosis Date  . Bilateral carpal tunnel syndrome   . Cataract immature    right  . Constipation    takes stool softener nightly  . Foot deformity, bilateral   . GERD (gastroesophageal reflux disease)   . Hx of colonic polyps   . Hyperlipidemia  takes Simvastatin daily  . Hypertension    takes Hyzaar and Amlodipine daily  . OA (osteoarthritis)   . RA (rheumatoid arthritis) ([place])   . Seasonal allergies   . Sensitive skin    pt states she has highly sensitive skin  . Urinary incontinence     Family History  Problem  Relation Age of Onset  . Thyroid disease Mother   . Alzheimer's disease Mother   . Stroke Brother   . Diabetes Son   . Anesthesia problems Neg Hx   . Hypotension Neg Hx   . Pseudochol deficiency Neg Hx   . Malignant hyperthermia Neg Hx    Past Surgical History:  Procedure Laterality Date  . CATARACT EXTRACTION Bilateral   . COLONOSCOPY    . DENTAL SURGERY    . TONSILLECTOMY  1960  . TOTAL KNEE ARTHROPLASTY  10/30/2011   Procedure: TOTAL KNEE ARTHROPLASTY;  Surgeon: Kerin Salen, MD;  Location: [place];  Service: Orthopedics;  Laterality: Left;  DEPUY/SIGMA  . TOTAL KNEE ARTHROPLASTY Right 08/05/2018   Procedure: TOTAL KNEE ARTHROPLASTY;  Surgeon: Vickey Huger, MD;  Location: [facility];  Service: Orthopedics;  Laterality: Right;  Marland Kitchen VAGINAL HYSTERECTOMY  1970's   partial   Social History   Social History Narrative  . Not on file   Immunization History  Administered Date(s) Administered  . Moderna Sars-Covid-2 Vaccination 09/18/2019, 10/14/2019, 07/19/2020     Objective: Vital Signs: BP 131/70 (BP Location: Left Arm, Patient Position: Sitting, Cuff Size: Normal)   Pulse 98   Ht 4\' 11"  (1.499 m)   Wt 169 lb 3.2 oz (76.7 kg)   BMI 34.17 kg/m    Physical Exam Vitals and nursing note reviewed.  Constitutional:      Appearance: She is well-developed.  HENT:     Head: Normocephalic and atraumatic.  Eyes:     Conjunctiva/sclera: Conjunctivae normal.  Pulmonary:     Effort: Pulmonary effort is normal.  Abdominal:     Palpations: Abdomen is soft.  Musculoskeletal:     Cervical back: Normal range of motion.  Skin:    General: Skin is warm and dry.     Capillary Refill: Capillary refill takes less than 2 seconds.  Neurological:     Mental Status: She is alert and oriented to person, place, and time.  Psychiatric:        Behavior: Behavior normal.      Musculoskeletal Exam: C-spine good ROM.  Postural thoracic kyphosis noted.  Painful ROM of lumbar spine.  Shoulder joints  and elbow joints good ROM. Slightly limited ROM of wrist joints.  PIP and DIP thickening consistent with OA.  Limited ROM of PIP joints.  Incomplete fist formation.  Hip joints good ROM with no discomfort.  Bilateral knee replacements have good ROM with discomfort bilaterally.  Warmth but no effusion noted.  Ankle joints good ROM with no tenderness or joint swelling.    CDAI Exam: CDAI Score: 0.4  Patient Global: 2 mm; Provider Global: 2 mm Swollen: 0 ; Tender: 0  Joint Exam 12/03/2020   No joint exam has been documented for this visit   There is currently no information documented on the homunculus. Go to the Rheumatology activity and complete the homunculus joint exam.  Investigation: No additional findings.  Imaging: No results found.  Recent Labs: Lab Results  Component Value Date   WBC 5.3 09/15/2020   HGB 11.4 (L) 09/15/2020   PLT 280 09/15/2020   NA 140 09/15/2020  K 3.7 09/15/2020   CL 103 09/15/2020   CO2 26 09/15/2020   GLUCOSE 88 09/15/2020   BUN 25 09/15/2020   CREATININE 0.82 09/15/2020   BILITOT 0.4 09/15/2020   ALKPHOS 66 07/30/2018   AST 21 09/15/2020   ALT 15 09/15/2020   PROT 6.8 09/15/2020   ALBUMIN 4.3 07/30/2018   CALCIUM 10.6 (H) 09/15/2020   GFRAA 79 09/15/2020    Speciality Comments: PLQ Eye Exam: 04/30/2020 WNL @ [facility] Follow up in 6 months  Procedures:  No procedures performed Allergies: Lactose intolerance (gi); Antihistamines, chlorpheniramine-type; Other; and Tape   Assessment / Plan:     Visit Diagnoses: 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  She has not had any recent rheumatoid arthritis flares. She is clinically doing well taking plaquenil 200 mg 1 tablet by mouth twice daily Monday through Friday.  She continues to tolerate Plaquenil without any side effects and has not missed any doses recently.  She experiences occasional  stiffness in both hands but has no synovitis on examination today.  She presents today with chronic pain and bilateral knee replacements.  She declined physical therapy and plans to continue home exercises.  Referral to pain management will be placed today.  We also discussed using her Rollator walker instead of a cane for increased ability while ambulating.  She will remain on Plaquenil as prescribed.  She was advised to notify us if she develops increased joint pain or joint swelling.  She will follow-up in the office in 5 months.  High risk medication use: Plaquenil 200 mg 1 tablet by mouth twice daily Monday through Friday only.  PLQ Eye Exam: 04/30/2020 WNL @ [facility] Follow up in 6 months CBC and CMP updated on 09/15/20. She will be due to update lab work in July and every 5 months.   She has not had any recent infections.   Primary osteoarthritis of both hands: She has PIP and DIP thickening consistent with osteoarthritis of both hands.  No joint tenderness or inflammation was noted.  Incomplete fist formation bilaterally due to limited range of motion and PIP joints.  History of total right knee replacement: Chronic pain.  She presents today with significant discomfort and bilateral knee replacements.  She has been having difficulty ambulating due to the severity of pain and stiffness.  She has been using a cane or walker to assist with ambulation.  She has not had any recent falls.  She was recently evaluated by Dr. Mayer Camel and had updated x-rays which did not reveal any acute abnormalities.  She declined physical therapy at that time and has continued home exercises without any improvement in her symptoms.  We discussed that physical therapy has other options for pain relief prescribed stretching and strengthening but she would like to hold off on a referral at this time.  She has tried using Voltaren gel as well as Tiger balm without any relief.  She cannot take NSAIDs due to history of reflux.   We discussed the use of a brace which she will consider.  She was also strongly encouraged to use her Rollator walker although limited pain to assist with ambulation.  Referral to pain management will be placed today.  She was also given a sample of Salonpas patches to try for pain relief.  History of total knee replacement, left: Chronic pain.  She has painful ROM of  the left knee replacement.  Tenderness over the IT band insertion site.  Declined PT.  She was given a salonpas patch sample to try.  Referral to pain management placed today.   Primary osteoarthritis of both feet: She is not experiencing any discomfort in her feet at this time.  She has good ROM of both ankle joints with no discomfort.  No warmth or effusion noted.   Other medical conditions are listed as follows:  History of hyperlipidemia  History of hypertension  Orders: No orders of the defined types were placed in this encounter.  No orders of the defined types were placed in this encounter.   Follow-Up Instructions: Return in about 5 months (around 05/05/2021) for Rheumatoid arthritis, Osteoarthritis.   Ofilia Neas, PA-C  Note - This record has been created using Dragon software.  Chart creation errors have been sought, but may not always  have been located. Such creation errors do not reflect on  the standard of medical care.

## 2020-12-03 ENCOUNTER — Other Ambulatory Visit: Payer: Self-pay

## 2020-12-03 ENCOUNTER — Encounter: Payer: Self-pay | Admitting: Physician Assistant

## 2020-12-03 ENCOUNTER — Ambulatory Visit (INDEPENDENT_AMBULATORY_CARE_PROVIDER_SITE_OTHER): Payer: Medicare Other | Admitting: Physician Assistant

## 2020-12-03 VITALS — BP 131/70 | HR 98 | Ht 59.0 in | Wt 169.2 lb

## 2020-12-03 DIAGNOSIS — M19071 Primary osteoarthritis, right ankle and foot: Secondary | ICD-10-CM

## 2020-12-03 DIAGNOSIS — M0609 Rheumatoid arthritis without rheumatoid factor, multiple sites: Secondary | ICD-10-CM

## 2020-12-03 DIAGNOSIS — Z79899 Other long term (current) drug therapy: Secondary | ICD-10-CM

## 2020-12-03 DIAGNOSIS — Z96652 Presence of left artificial knee joint: Secondary | ICD-10-CM

## 2020-12-03 DIAGNOSIS — M19041 Primary osteoarthritis, right hand: Secondary | ICD-10-CM

## 2020-12-03 DIAGNOSIS — Z96651 Presence of right artificial knee joint: Secondary | ICD-10-CM | POA: Diagnosis not present

## 2020-12-03 DIAGNOSIS — M19072 Primary osteoarthritis, left ankle and foot: Secondary | ICD-10-CM

## 2020-12-03 DIAGNOSIS — M19042 Primary osteoarthritis, left hand: Secondary | ICD-10-CM

## 2020-12-03 DIAGNOSIS — Z8639 Personal history of other endocrine, nutritional and metabolic disease: Secondary | ICD-10-CM

## 2020-12-03 DIAGNOSIS — Z8679 Personal history of other diseases of the circulatory system: Secondary | ICD-10-CM

## 2020-12-03 NOTE — Addendum Note (Signed)
Addended by: Earnestine Mealing on: 12/03/2020 12:13 PM   Modules accepted: Orders

## 2020-12-20 ENCOUNTER — Other Ambulatory Visit: Payer: Self-pay | Admitting: Physician Assistant

## 2020-12-20 DIAGNOSIS — M0609 Rheumatoid arthritis without rheumatoid factor, multiple sites: Secondary | ICD-10-CM

## 2020-12-20 NOTE — Telephone Encounter (Signed)
Last Visit: 12/03/2020  Next Visit: 02/16/2021  Labs: 09/15/2020, RBC count, hemoglobin, and hematocrit are low.  Hgb has dropped significantly. Please notify the patient. Please clarify if she has noticed any blood in her stool?  Please forward lab work to PCP for further evaluation.  Calcium is elevated-10.6. albumin WNL. She is taking HCTZ which may be contributing to slight hypercalcemia.  Please forward to PCP.   Eye exam: 04/30/2020, patient had eye appt 10/2020   Current Dose per office note 12/03/2020, Plaquenil 200 mg 1 tablet by mouth twice daily Monday through Friday only  WE:[id number] arthritis of multiple sites with negative rheumatoid factor  Last Fill: 09/24/2020  Okay to refill Plaquenil?

## 2020-12-31 ENCOUNTER — Telehealth: Payer: Self-pay

## 2020-12-31 NOTE — Telephone Encounter (Signed)
Patient called to let Lovena Le know that she has an appointment with pain management on Tuesday, 02/01/21 at 11:00 am.

## 2021-02-02 NOTE — Progress Notes (Deleted)
Office Visit Note  Patient: Ann Gamble             Date of Birth: May 12, 1941           MRN: [id number]             PCP: Maryella Shivers, MD Referring: Maryella Shivers, MD Visit Date: 02/16/2021 Occupation: @GUAROCC @  Subjective:  No chief complaint on file.   History of Present Illness: Ann Gamble is a 80 y.o. female ***   Activities of Daily Living:  Patient reports morning stiffness for *** {minute/hour:19697}.   Patient {ACTIONS;DENIES/REPORTS:21021675::"Denies"} nocturnal pain.  Difficulty dressing/grooming: {ACTIONS;DENIES/REPORTS:21021675::"Denies"} Difficulty climbing stairs: {ACTIONS;DENIES/REPORTS:21021675::"Denies"} Difficulty getting out of chair: {ACTIONS;DENIES/REPORTS:21021675::"Denies"} Difficulty using hands for taps, buttons, cutlery, and/or writing: {ACTIONS;DENIES/REPORTS:21021675::"Denies"}  No Rheumatology ROS completed.   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 factor ([place]) 09/04/2016   High risk medication use 09/04/2016   Primary osteoarthritis of right knee 09/04/2016   History of total knee replacement, left 09/04/2016   Primary osteoarthritis of both hands 09/04/2016   Primary osteoarthritis of both feet 09/04/2016   Essential hypertension 09/04/2016   Dyslipidemia 09/04/2016    Past Medical History:  Diagnosis Date   Bilateral carpal tunnel syndrome    Cataract immature    right   Constipation    takes stool softener nightly   Foot deformity, bilateral    GERD (gastroesophageal reflux disease)    Hx of colonic polyps    Hyperlipidemia    takes Simvastatin daily   Hypertension    takes Hyzaar and Amlodipine daily   OA (osteoarthritis)    RA (rheumatoid arthritis) (HCC)    Seasonal allergies    Sensitive skin    pt states she has highly sensitive skin   Urinary incontinence      Family History  Problem Relation Age of Onset   Thyroid disease Mother    Alzheimer's disease Mother    Stroke Brother    Diabetes Son    Anesthesia problems Neg Hx    Hypotension Neg Hx    Pseudochol deficiency Neg Hx    Malignant hyperthermia Neg Hx    Past Surgical History:  Procedure Laterality Date   CATARACT EXTRACTION Bilateral    COLONOSCOPY     DENTAL SURGERY     TONSILLECTOMY  1960   TOTAL KNEE ARTHROPLASTY  10/30/2011   Procedure: TOTAL KNEE ARTHROPLASTY;  Surgeon: Kerin Salen, MD;  Location: [facility];  Service: Orthopedics;  Laterality: Left;  DEPUY/SIGMA   TOTAL KNEE ARTHROPLASTY Right 08/05/2018   Procedure: TOTAL KNEE ARTHROPLASTY;  Surgeon: Vickey Huger, MD;  Location: [facility];  Service: Orthopedics;  Laterality: Right;   VAGINAL HYSTERECTOMY  1970's   partial   Social History   Social History Narrative   Not on file   Immunization History  Administered Date(s) Administered   Moderna Sars-Covid-2 Vaccination 09/18/2019, 10/14/2019, 07/19/2020     Objective: Vital Signs: There were no vitals taken for this visit.   Physical Exam   Musculoskeletal Exam:   CDAI Exam: CDAI Score: -- Patient Global: --; Provider Global: -- Swollen: --; Tender: -- Joint Exam 02/16/2021   No joint exam has been documented for this visit   There is currently no information documented on the homunculus. Go to the Rheumatology activity and complete the homunculus joint exam.  Investigation: No additional findings.  Imaging:  No results found.  Recent Labs: Lab Results  Component Value Date   WBC 5.3 09/15/2020   HGB 11.4 (L) 09/15/2020   PLT 280 09/15/2020   NA 140 09/15/2020   K 3.7 09/15/2020   CL 103 09/15/2020   CO2 26 09/15/2020   GLUCOSE 88 09/15/2020   BUN 25 09/15/2020   CREATININE 0.82 09/15/2020   BILITOT 0.4 09/15/2020   ALKPHOS 66 07/30/2018   AST 21 09/15/2020   ALT 15 09/15/2020   PROT 6.8 09/15/2020   ALBUMIN 4.3 07/30/2018   CALCIUM 10.6  (H) 09/15/2020   GFRAA 79 09/15/2020    Speciality Comments: PLQ Eye Exam: 10/15/2020 WNL @ [facility] Follow up in  12 months   Procedures:  No procedures performed Allergies: Lactose intolerance (gi); Antihistamines, chlorpheniramine-type; Other; and Tape   Assessment / Plan:     Visit Diagnoses: No diagnosis found.  Orders: No orders of the defined types were placed in this encounter.  No orders of the defined types were placed in this encounter.   Face-to-face time spent with patient was *** minutes. Greater than 50% of time was spent in counseling and coordination of care.  Follow-Up Instructions: No follow-ups on file.   Earnestine Mealing, CMA  Note - This record has been created using Editor, commissioning.  Chart creation errors have been sought, but may not always  have been located. Such creation errors do not reflect on  the standard of medical care.

## 2021-02-16 ENCOUNTER — Ambulatory Visit: Payer: Medicare Other | Admitting: Rheumatology

## 2021-03-14 ENCOUNTER — Other Ambulatory Visit: Payer: Self-pay | Admitting: Rheumatology

## 2021-03-14 DIAGNOSIS — M0609 Rheumatoid arthritis without rheumatoid factor, multiple sites: Secondary | ICD-10-CM

## 2021-03-14 NOTE — Telephone Encounter (Signed)
Please call patient to schedule appt. Thank you.  Return in about 5 months (around 05/05/2021) for Rheumatoid arthritis, Osteoarthritis.

## 2021-03-14 NOTE — Telephone Encounter (Signed)
Last Visit: 12/03/2020  Next Visit: message sent to front desk to schedule appt, Return in about 5 months (around 05/05/2021) for Rheumatoid arthritis, Osteoarthritis.  Labs: 09/15/2020, RBC count, hemoglobin, and hematocrit are low.   Hgb has dropped significantly.  Please notify the patient.  Please clarify if she has noticed any blood in herstool?  Please forward lab work to PCP for further evaluation.   Calcium is elevated-10.6. albumin WNL.  She is taking HCTZ which may becontributing to slight hypercalcemia.   Please forward to PCP.  Eye exam: 10/15/2020 WNL   Current Dose per office note 12/03/2020: Plaquenil 200 mg 1 tablet by mouth twice daily Monday through Friday only  [id number] arthritis of multiple sites with negative rheumatoid factor  Last Fill: 12/20/2020  Okay to refill Plaquenil?

## 2021-04-28 NOTE — Progress Notes (Signed)
Office Visit Note  Patient: Ann Gamble             Date of Birth: 01-09-1941           MRN: [id number]             PCP: Maryella Shivers, MD Referring: Maryella Shivers, MD Visit Date: 05/12/2021 Occupation: '@GUAROCC'$ @  Subjective:  Medication management.   History of Present Illness: Ann Gamble is a 80 y.o. female with a history of rheumatoid arthritis and osteoarthritis.  She states she continues to have some pain and stiffness in her hands.  She continues to have pain in her bilateral knee joints despite having bilateral total knee replacement.  She states that her knee joint pain has been better since she has been going to the pain management.  She has been taking hydrocodone about half a tablet 3 times a day which has been helping her.  Activities of Daily Living:  Patient reports morning stiffness for 2-3 hours.   Patient Reports nocturnal pain.  Difficulty dressing/grooming: Reports Difficulty climbing stairs: Reports Difficulty getting out of chair: Denies Difficulty using hands for taps, buttons, cutlery, and/or writing: Reports  Review of Systems  Constitutional:  Positive for fatigue.  HENT:  Positive for mouth dryness. Negative for mouth sores and nose dryness.   Eyes:  Negative for pain, itching and dryness.  Respiratory:  Positive for shortness of breath. Negative for difficulty breathing.   Cardiovascular:  Negative for chest pain and palpitations.  Gastrointestinal:  Negative for blood in stool, constipation and diarrhea.  Endocrine: Negative for increased urination.  Genitourinary:  Negative for difficulty urinating.  Musculoskeletal:  Positive for joint pain, joint pain, myalgias, morning stiffness, muscle tenderness and myalgias. Negative for joint swelling.  Skin:  Negative for color change, rash and redness.  Allergic/Immunologic: Negative for susceptible to infections.  Neurological:  Positive for numbness and memory loss. Negative for  dizziness, headaches and weakness.  Hematological:  Positive for bruising/bleeding tendency.  Psychiatric/Behavioral:  Negative for confusion.    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 factor ([place]) 09/04/2016   High risk medication use 09/04/2016   Primary osteoarthritis of right knee 09/04/2016   History of total knee replacement, left 09/04/2016   Primary osteoarthritis of both hands 09/04/2016   Primary osteoarthritis of both feet 09/04/2016   Essential hypertension 09/04/2016   Dyslipidemia 09/04/2016    Past Medical History:  Diagnosis Date   Bilateral carpal tunnel syndrome    Cataract immature    right   Constipation    takes stool softener nightly   Foot deformity, bilateral    GERD (gastroesophageal reflux disease)    Hx of colonic polyps    Hyperlipidemia    takes Simvastatin daily   Hypertension    takes Hyzaar and Amlodipine daily   OA (osteoarthritis)    RA (rheumatoid arthritis) (HCC)    Seasonal allergies    Sensitive skin    pt states she has highly sensitive skin   Urinary incontinence     Family History  Problem Relation Age of Onset   Thyroid disease Mother    Alzheimer's disease Mother    Stroke Brother    Diabetes Son    Anesthesia problems Neg Hx    Hypotension Neg Hx    Pseudochol deficiency Neg Hx    Malignant hyperthermia Neg  Hx    Past Surgical History:  Procedure Laterality Date   CATARACT EXTRACTION Bilateral    COLONOSCOPY     DENTAL SURGERY     TONSILLECTOMY  1960   TOTAL KNEE ARTHROPLASTY  10/30/2011   Procedure: TOTAL KNEE ARTHROPLASTY;  Surgeon: Kerin Salen, MD;  Location: [facility];  Service: Orthopedics;  Laterality: Left;  DEPUY/SIGMA   TOTAL KNEE ARTHROPLASTY Right 08/05/2018   Procedure: TOTAL KNEE ARTHROPLASTY;  Surgeon: Vickey Huger, MD;  Location: [facility];  Service: Orthopedics;  Laterality:  Right;   VAGINAL HYSTERECTOMY  1970's   partial   Social History   Social History Narrative   Not on file   Immunization History  Administered Date(s) Administered   Moderna Sars-Covid-2 Vaccination 09/18/2019, 10/14/2019, 07/19/2020     Objective: Vital Signs: BP 98/65 (BP Location: Left Arm, Patient Position: Sitting, Cuff Size: Normal)   Pulse (!) 116   Ht '4\' 11"'$  (1.499 m)   Wt 152 lb 12.8 oz (69.3 kg)   BMI 30.86 kg/m    Physical Exam Vitals and nursing note reviewed.  Constitutional:      Appearance: She is well-developed.  HENT:     Head: Normocephalic and atraumatic.  Eyes:     Conjunctiva/sclera: Conjunctivae normal.  Cardiovascular:     Rate and Rhythm: Normal rate and regular rhythm.     Heart sounds: Normal heart sounds.  Pulmonary:     Effort: Pulmonary effort is normal.     Breath sounds: Normal breath sounds.  Abdominal:     General: Bowel sounds are normal.     Palpations: Abdomen is soft.  Musculoskeletal:     Cervical back: Normal range of motion.  Lymphadenopathy:     Cervical: No cervical adenopathy.  Skin:    General: Skin is warm and dry.     Capillary Refill: Capillary refill takes less than 2 seconds.  Neurological:     Mental Status: She is alert and oriented to person, place, and time.  Psychiatric:        Behavior: Behavior normal.     Musculoskeletal Exam: C-spine was enlarged range of motion.  Shoulder joints with good range of motion.  Elbow joints and wrist joints in good range of motion.  She had no synovitis of her MCPs.  She had bilateral PIP and DIP thickening and subluxation of several of the PIP and DIP joints.  Hip joints were difficult to assess in the sitting position.  She had good range of motion of bilateral knee joints.  There was no tenderness over ankles or MTPs.  CDAI Exam: CDAI Score: 0.4  Patient Global: 2 mm; Provider Global: 2 mm Swollen: 0 ; Tender: 0  Joint Exam 05/12/2021   No joint exam has been  documented for this visit   There is currently no information documented on the homunculus. Go to the Rheumatology activity and complete the homunculus joint exam.  Investigation: No additional findings.  Imaging: No results found.  Recent Labs: Lab Results  Component Value Date   WBC 5.3 09/15/2020   HGB 11.4 (L) 09/15/2020   PLT 280 09/15/2020   NA 140 09/15/2020   K 3.7 09/15/2020   CL 103 09/15/2020   CO2 26 09/15/2020   GLUCOSE 88 09/15/2020   BUN 25 09/15/2020   CREATININE 0.82 09/15/2020   BILITOT 0.4 09/15/2020   ALKPHOS 66 07/30/2018   AST 21 09/15/2020   ALT 15 09/15/2020   PROT 6.8 09/15/2020   ALBUMIN 4.3  07/30/2018   CALCIUM 10.6 (H) 09/15/2020   GFRAA 79 09/15/2020    Speciality Comments: PLQ Eye Exam: 10/15/2020 WNL @ [facility] Follow up in  12 months   Procedures:  No procedures performed Allergies: Lactose intolerance (gi); Antihistamines, chlorpheniramine-type; Other; and Tape   Assessment / Plan:     Visit Diagnoses: Rheumatoid arthritis of multiple sites with negative rheumatoid factor (HCC) - +RF, +CCP: Patient had no synovitis on my examination today.  She has been tolerating hydroxychloroquine well without any side effects.  High risk medication use - Plaquenil 200 mg 1 tablet by mouth twice daily Monday through Friday only. PLQ Eye Exam: 10/15/2020  -I will obtain labs today.  She has been advised to get eye examination in March 2023.  Updated information regarding immunization was also placed in the AVS.  Plan: CBC with Differential/Platelet, COMPLETE METABOLIC PANEL WITH GFR  Primary osteoarthritis of both hands-she has severe osteoarthritis in her bilateral hands with subluxation of PIP and DIP joints.  Joint protection muscle strengthening was discussed.  History of total right knee replacement-she has chronic pain in her bilateral knee joints.  She states the pain has been better since [facility] to pain management.  She is currently on  hydrocodone 1/2 tablet 3 times daily.  History of total knee replacement, left-chronic pain.  She mobilizes with the help of a walker.  Primary osteoarthritis of both feet-she is currently not having any discomfort.  History of hypertension-blood pressure is normal today.  History of hyperlipidemia  Orders: Orders Placed This Encounter  Procedures   CBC with Differential/Platelet   COMPLETE METABOLIC PANEL WITH GFR    No orders of the defined types were placed in this encounter.    Follow-Up Instructions: Return in about 5 months (around 10/11/2021) for Rheumatoid arthritis.   Bo Merino, MD  Note - This record has been created using Editor, commissioning.  Chart creation errors have been sought, but may not always  have been located. Such creation errors do not reflect on  the standard of medical care.

## 2021-05-12 ENCOUNTER — Ambulatory Visit (INDEPENDENT_AMBULATORY_CARE_PROVIDER_SITE_OTHER): Payer: Medicare Other | Admitting: Rheumatology

## 2021-05-12 ENCOUNTER — Encounter: Payer: Self-pay | Admitting: Rheumatology

## 2021-05-12 ENCOUNTER — Other Ambulatory Visit: Payer: Self-pay

## 2021-05-12 VITALS — BP 98/65 | HR 116 | Ht 59.0 in | Wt 152.8 lb

## 2021-05-12 DIAGNOSIS — Z8679 Personal history of other diseases of the circulatory system: Secondary | ICD-10-CM

## 2021-05-12 DIAGNOSIS — Z96651 Presence of right artificial knee joint: Secondary | ICD-10-CM | POA: Diagnosis not present

## 2021-05-12 DIAGNOSIS — M0609 Rheumatoid arthritis without rheumatoid factor, multiple sites: Secondary | ICD-10-CM

## 2021-05-12 DIAGNOSIS — M19042 Primary osteoarthritis, left hand: Secondary | ICD-10-CM

## 2021-05-12 DIAGNOSIS — Z8639 Personal history of other endocrine, nutritional and metabolic disease: Secondary | ICD-10-CM

## 2021-05-12 DIAGNOSIS — Z96652 Presence of left artificial knee joint: Secondary | ICD-10-CM

## 2021-05-12 DIAGNOSIS — M19072 Primary osteoarthritis, left ankle and foot: Secondary | ICD-10-CM

## 2021-05-12 DIAGNOSIS — M19041 Primary osteoarthritis, right hand: Secondary | ICD-10-CM

## 2021-05-12 DIAGNOSIS — M19071 Primary osteoarthritis, right ankle and foot: Secondary | ICD-10-CM

## 2021-05-12 DIAGNOSIS — Z79899 Other long term (current) drug therapy: Secondary | ICD-10-CM

## 2021-05-12 NOTE — Patient Instructions (Signed)
Vaccines You are taking a medication(s) that can suppress your immune system.  The following immunizations are recommended: Flu annually Covid-19  Td/Tdap (tetanus, diphtheria, pertussis) every 10 years Pneumonia (Prevnar 15 then Pneumovax 23 at least 1 year apart.  Alternatively, can take Prevnar 20 without needing additional dose) Shingrix: 2 doses from 4 weeks to 6 months apart  Please check with your PCP to make sure you are up to date.  

## 2021-05-13 ENCOUNTER — Telehealth: Payer: Self-pay | Admitting: Pharmacist

## 2021-05-13 ENCOUNTER — Other Ambulatory Visit (HOSPITAL_COMMUNITY): Payer: Self-pay

## 2021-05-13 LAB — COMPLETE METABOLIC PANEL WITH GFR
AG Ratio: 1.7 (calc) (ref 1.0–2.5)
ALT: 11 U/L (ref 6–29)
AST: 17 U/L (ref 10–35)
Albumin: 4.3 g/dL (ref 3.6–5.1)
Alkaline phosphatase (APISO): 76 U/L (ref 37–153)
BUN: 21 mg/dL (ref 7–25)
CO2: 27 mmol/L (ref 20–32)
Calcium: 10.1 mg/dL (ref 8.6–10.4)
Chloride: 101 mmol/L (ref 98–110)
Creat: 0.97 mg/dL (ref 0.60–1.00)
Globulin: 2.6 g/dL (calc) (ref 1.9–3.7)
Glucose, Bld: 94 mg/dL (ref 65–99)
Potassium: 4.1 mmol/L (ref 3.5–5.3)
Sodium: 137 mmol/L (ref 135–146)
Total Bilirubin: 0.3 mg/dL (ref 0.2–1.2)
Total Protein: 6.9 g/dL (ref 6.1–8.1)
eGFR: 59 mL/min/{1.73_m2} — ABNORMAL LOW (ref >=60)

## 2021-05-13 LAB — CBC WITH DIFFERENTIAL/PLATELET
Absolute Monocytes: 634 cells/uL (ref 200–950)
Basophils Absolute: 72 cells/uL (ref 0–200)
Basophils Relative: 1 %
Eosinophils Absolute: 410 cells/uL (ref 15–500)
Eosinophils Relative: 5.7 %
HCT: 26.9 % — ABNORMAL LOW (ref 35.0–45.0)
Hemoglobin: 7.4 g/dL — ABNORMAL LOW (ref 11.7–15.5)
Lymphs Abs: 1591 cells/uL (ref 850–3900)
MCH: 21 pg — ABNORMAL LOW (ref 27.0–33.0)
MCHC: 27.5 g/dL — ABNORMAL LOW (ref 32.0–36.0)
MCV: 76.4 fL — ABNORMAL LOW (ref 80.0–100.0)
MPV: 9.7 fL (ref 7.5–12.5)
Monocytes Relative: 8.8 %
Neutro Abs: 4493 cells/uL (ref 1500–7800)
Neutrophils Relative %: 62.4 %
Platelets: 402 10*3/uL — ABNORMAL HIGH (ref 140–400)
RBC: 3.52 10*6/uL — ABNORMAL LOW (ref 3.80–5.10)
RDW: 17 % — ABNORMAL HIGH (ref 11.0–15.0)
Total Lymphocyte: 22.1 %
WBC: 7.2 10*3/uL (ref 3.8–10.8)

## 2021-05-13 NOTE — Telephone Encounter (Addendum)
Please start Humira BIV.  Dose: 40mg  every 14 days  Dx: Rheumatoid arthritis (M05.9)  Current regimen: Humira and hydroxychloroquine  ----- Message from Carole Binning, LPN sent at 03/23/1750 12:11 PM EDT ----- Patient recently moved here from [place]. Patient has been on Humira and we consented her on it today. Do you need to reapply for the Humira. She states her speciality pharmacy is [facility],.

## 2021-05-13 NOTE — Progress Notes (Signed)
Significant drop in hemoglobin noted at 7.4.  Please notify patient and her PCPs office.  Patient will need further evaluation for anemia.  Creatinine is mildly elevated.

## 2021-05-13 NOTE — Telephone Encounter (Signed)
Submitted a Prior Authorization request to [facility] for HUMIRA via CoverMyMeds. Will update once we receive a response.   KeyFrancesca Oman - PA Case ID: [id number]

## 2021-05-16 ENCOUNTER — Other Ambulatory Visit (HOSPITAL_COMMUNITY): Payer: Self-pay

## 2021-05-16 NOTE — Telephone Encounter (Signed)
Received notification from [facility] regarding a prior authorization for [place]. Authorization has been APPROVED from 05/13/21 to 08/13/21.   Per test claim, copay for 28 days supply is $0.00  Patient can fill through [place]: [phone]   Authorization # [id number]

## 2021-05-17 NOTE — Telephone Encounter (Signed)
Called patient and she stated that she absolutely does not take any injections at home. Patient is not taking Humira and has not recently moved from [place]. She has been established with our clinic since at least 2021.  Apologized to patient for the confusion. Will close encounter.  Knox Saliva, PharmD, MPH, BCPS Clinical Pharmacist (Rheumatology and Pulmonology)

## 2021-05-19 ENCOUNTER — Ambulatory Visit (INDEPENDENT_AMBULATORY_CARE_PROVIDER_SITE_OTHER): Payer: Medicare Other | Admitting: Gastroenterology

## 2021-05-19 ENCOUNTER — Encounter: Payer: Self-pay | Admitting: Gastroenterology

## 2021-05-19 VITALS — BP 112/58 | HR 95 | Ht 59.0 in | Wt 151.0 lb

## 2021-05-19 DIAGNOSIS — R195 Other fecal abnormalities: Secondary | ICD-10-CM | POA: Diagnosis not present

## 2021-05-19 DIAGNOSIS — D509 Iron deficiency anemia, unspecified: Secondary | ICD-10-CM | POA: Diagnosis not present

## 2021-05-19 MED ORDER — OMEPRAZOLE 20 MG PO CPDR: 20.0000 mg | DELAYED_RELEASE_CAPSULE | Freq: Two times a day (BID) | ORAL | 3 refills | Status: DC

## 2021-05-19 NOTE — Progress Notes (Signed)
Chief Complaint: IDA  Referring Provider:  Maryella Shivers, MD      ASSESSMENT AND PLAN;   #1. IDA with heme + stools  #2. H/O Constipation  #3. 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  Plan: -EGD/colon for further eval. -Omeprazole 40mg  po QD -Continue MiraLAX 17 g p.o. once a day. -CBC, CMP at time of proc. -Avoid NSAIDs -If still with problems, CT AP with PO/IV contrast -D/W family.   I discussed EGD/Colonoscopy- the indications, risks, alternatives and potential complications including, but not limited to, bleeding, infection, reaction to medication, damage to internal organs, cardiac and/or pulmonary problems, and perforation requiring surgery (1 to 2 in 1000). The possibility that significant findings could be missed was explained. All ? were answered. The patient gives consent to proceed.  HPI:    Ann Gamble is a 80 y.o. female  With RA, GERD, HTN, HLD, OA  With IDA and H+ stools. Hb dropped from 14 (02/2020) to 11.4 (09/2020) and then 7.4, MCV 76(05/12/2021), Nl BUN/Cr.  Has been on ASA and alleve. Stopped omeprazole on her own 6 months ago.  Aspirin and Aleve has been discontinued. S/P IV Fe today ar RH (1st dose), next due 10/13 Heme + stools but brown stools No active bleeding.  No nausea, vomiting, heartburn, regurgitation, odynophagia or dysphagia (none since dilation).  No significant diarrhea or constipation (better with mirlax 17g QD).  No melena or hematochezia. No unintentional weight loss. No abdominal pain.  Has been craving ice.  Denies having any nosebleeds, easy bruisability.  Seen by Dr. Marco Collie.  We worked her into my clinic.  She has a previous history of IDA with Hb 8.8, MCV 80 April 2018 and had responded to iron well  Past GI procedures: Colonoscopy 11/2016 ([facility]): Mild sigmoid diverticulosis, normal normal to TI. EGD 10/2016: Transient Schatzki's ring at 38 cm s/p dil 8fr, small hiatal  hernia.  Negative CLOtest, negative small bowel biopsies for celiac CT Abdo/pelvis 11/2016 negative except for 1.2 cm IPMN unchanged as compared to CT 2014.  Past Medical History:  Diagnosis Date   Bilateral carpal tunnel syndrome    Cataract immature    right   Chronic gastritis    Constipation    takes stool softener nightly   Foot deformity, bilateral    GERD (gastroesophageal reflux disease)    Hx of colonic polyps    Hyperlipidemia    takes Simvastatin daily   Hypertension    takes Hyzaar and Amlodipine daily   IPMN (intraductal papillary mucinous neoplasm)    1.2cm on CT 11-28-2016   OA (osteoarthritis)    RA (rheumatoid arthritis) (HCC)    Seasonal allergies    Sensitive skin    pt states she has highly sensitive skin   Urinary incontinence     Past Surgical History:  Procedure Laterality Date   CATARACT EXTRACTION Bilateral    COLONOSCOPY  11/20/2016   Mild sigmoid diverticulosis. Otherwise normal colonoscopy to terminal ileum   DENTAL SURGERY     ESOPHAGOGASTRODUODENOSCOPY  11/06/2016   Transient schatzki's ring status post esophageal dilatation. Small hiatal hernia. 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   Procedure: TOTAL KNEE ARTHROPLASTY;  Surgeon: Kerin Salen, MD;  Location: [place];  Service: Orthopedics;  Laterality: Left;  DEPUY/SIGMA   TOTAL KNEE ARTHROPLASTY Right 08/05/2018   Procedure: TOTAL KNEE ARTHROPLASTY;  Surgeon: Vickey Huger, MD;  Location: [facility];  Service: Orthopedics;  Laterality: Right;   VAGINAL HYSTERECTOMY  1970's   partial    Family History  Problem Relation Age of Onset   Thyroid disease Mother    Alzheimer's disease Mother    Stroke Brother    Diabetes Son    Anesthesia problems Neg Hx    Hypotension Neg Hx    Pseudochol deficiency Neg Hx    Malignant hyperthermia Neg Hx    Colon cancer Neg Hx    Rectal cancer Neg Hx    Stomach cancer Neg Hx     Social History   Tobacco Use    Smoking status: Former    Packs/day: 0.50    Years: 43.00    Pack years: 21.50    Types: Cigarettes    Quit date: 08/15/2003    Years since quitting: 17.7   Smokeless tobacco: Never  Vaping Use   Vaping Use: Never used  Substance Use Topics   Alcohol use: Not Currently    Comment: 10/31/11 "beer or wine rarely; last drink ages ago"   Drug use: Never    Current Outpatient Medications  Medication Sig Dispense Refill   amLODipine (NORVASC) 10 MG tablet Take 10 mg by mouth every evening.      ASPIRIN 81 PO Take by mouth daily.     Cetirizine HCl (ZYRTEC PO) Take by mouth.     hydrochlorothiazide (HYDRODIURIL) 25 MG tablet Take 25 mg by mouth daily.      HYDROcodone-acetaminophen (NORCO/VICODIN) 5-325 MG tablet Take 1 tablet by mouth 2 (two) times daily.     hydroxychloroquine (PLAQUENIL) 200 MG tablet TAKE 1 TABLET BY MOUTH TWICE DAILY, MONDAY THROUGH FRIDAY, DO NOT TAKE ON SATURDAY OR SUNDAY 120 tablet 0   losartan (COZAAR) 100 MG tablet Take 100 mg by mouth daily.      MULTIPLE VITAMIN PO Take 1 tablet by mouth daily.      MYRBETRIQ 25 MG TB24 tablet 50 mg daily.     MYRBETRIQ 50 MG TB24 tablet Take 50 mg by mouth daily.     omeprazole (PRILOSEC) 40 MG capsule Take 40 mg by mouth daily.     Ophthalmic Irrigation Solution (OCUSOFT EYE [place] OP) Apply to eye.      Polyethylene Glycol 3350 (MIRALAX PO) Take by mouth daily.     simvastatin (ZOCOR) 10 MG tablet Take 10 mg by mouth every evening.      sodium chloride (OCEAN) 0.65 % nasal spray Place 2 sprays into the nose as needed for congestion.      No current facility-administered medications for this visit.    Allergies  Allergen Reactions   Lactose Intolerance (Gi) Other (See Comments)    Gas pain   Antihistamines, Chlorpheniramine-Type Other (See Comments)    "Made her real dry in her throat, nausea"    Other Rash    Wool and Sun Ultraviolet rays   Tape Rash    Review of Systems:  Constitutional: Denies fever, chills,  diaphoresis, appetite change and has fatigue.  HEENT: Denies photophobia, eye pain, redness, hearing loss, ear pain, congestion, sore throat, rhinorrhea, sneezing, mouth sores, neck pain, neck stiffness and tinnitus.   Respiratory: Denies SOB, DOE, cough, chest tightness,  and wheezing.   Cardiovascular: Denies chest pain, palpitations and leg swelling.  Genitourinary: Denies dysuria, urgency, frequency, hematuria, flank pain and difficulty urinating.  Musculoskeletal: Has myalgias, back pain, joint swelling, arthralgias and gait problem.  Skin: No rash.  Neurological: Denies dizziness,  seizures, syncope, weakness, light-headedness, numbness and headaches.  Hematological: Denies adenopathy. Easy bruising, personal or family bleeding history  Psychiatric/Behavioral: No anxiety or depression     Physical Exam:    BP (!) 112/58   Pulse 95   Ht 4\' 11"  (1.499 m)   Wt 151 lb (68.5 kg)   SpO2 95%   BMI 30.50 kg/m  Wt Readings from Last 3 Encounters:  05/19/21 151 lb (68.5 kg)  05/12/21 152 lb 12.8 oz (69.3 kg)  12/03/20 169 lb 3.2 oz (76.7 kg)   Constitutional:  Well-developed, in no acute distress. Psychiatric: Normal mood and affect. Behavior is normal. HEENT: Pupils normal.  Conjunctivae are normal. No scleral icterus. Cardiovascular: Normal rate, regular rhythm. No edema Pulmonary/chest: Effort normal and breath sounds normal. No wheezing, rales or rhonchi. Abdominal: Soft, nondistended. Nontender. Bowel sounds active throughout. There are no masses palpable. No hepatomegaly. Rectal: Performed by Dr. Nyra Capes.  Brown heme positive stools. Neurological: Alert and oriented to person place and time. Skin: Skin is warm and dry. No rashes noted.  Data Reviewed: I have personally reviewed following labs and imaging studies  CBC: CBC Latest Ref Rng & Units 05/12/2021 09/15/2020 02/23/2020  WBC 3.8 - 10.8 Thousand/uL 7.2 5.3 5.0  Hemoglobin 11.7 - 15.5 g/dL 7.4(L) 11.4(L) 14.1  Hematocrit  35.0 - 45.0 % 26.9(L) 34.9(L) 42.0  Platelets 140 - 400 Thousand/uL 402(H) 280 238    CMP: CMP Latest Ref Rng & Units 05/12/2021 09/15/2020 02/23/2020  Glucose 65 - 99 mg/dL 94 88 87  BUN 7 - 25 mg/dL 21 25 18   Creatinine 0.60 - 1.00 mg/dL 0.97 0.82 0.81  Sodium 135 - 146 mmol/L 137 140 140  Potassium 3.5 - 5.3 mmol/L 4.1 3.7 4.3  Chloride 98 - 110 mmol/L 101 103 104  CO2 20 - 32 mmol/L 27 26 27   Calcium 8.6 - 10.4 mg/dL 10.1 10.6(H) 10.4  Total Protein 6.1 - 8.1 g/dL 6.9 6.8 6.9  Total Bilirubin 0.2 - 1.2 mg/dL 0.3 0.4 0.4  Alkaline Phos 38 - 126 U/L - - -  AST 10 - 35 U/L 17 21 18   ALT 6 - 29 U/L 11 15 13       Carmell Austria, MD 05/19/2021, 2:29 PM  Cc: Maryella Shivers, MD

## 2021-05-19 NOTE — Patient Instructions (Addendum)
If you are age 79 or older, your body mass index should be between 23-30. Your Body mass index is 30.5 kg/m. If this is out of the aforementioned range listed, please consider follow up with your Primary Care Provider.  If you are age 80 or younger, your body mass index should be between 19-25. Your Body mass index is 30.5 kg/m. If this is out of the aformentioned range listed, please consider follow up with your Primary Care Provider.   __________________________________________________________  The  GI providers would like to encourage you to use [facility] to communicate with providers for non-urgent requests or questions.  Due to long hold times on the telephone, sending your provider a message by [facility] may be a faster and more efficient way to get a response.  Please allow 48 business hours for a response.  Please remember that this is for non-urgent requests.   You have been scheduled for an endoscopy and colonoscopy. Please follow the written instructions given to you at your visit today. Please pick up your prep supplies at the pharmacy within the next 1-3 days. If you use inhalers (even only as needed), please bring them with you on the day of your procedure.  We have sent the following medications to your pharmacy for you to pick up at your convenience: Omeprazole 40mg  daily  Thank you,  Dr. Jackquline Denmark

## 2021-05-24 ENCOUNTER — Ambulatory Visit (AMBULATORY_SURGERY_CENTER): Payer: Medicare Other | Admitting: Gastroenterology

## 2021-05-24 ENCOUNTER — Encounter: Payer: Self-pay | Admitting: Gastroenterology

## 2021-05-24 ENCOUNTER — Other Ambulatory Visit (INDEPENDENT_AMBULATORY_CARE_PROVIDER_SITE_OTHER): Payer: Medicare Other

## 2021-05-24 ENCOUNTER — Other Ambulatory Visit: Payer: Self-pay

## 2021-05-24 VITALS — BP 122/57 | HR 93 | Temp 98.4°F | Resp 27 | Ht 59.0 in | Wt 151.0 lb

## 2021-05-24 DIAGNOSIS — K5730 Diverticulosis of large intestine without perforation or abscess without bleeding: Secondary | ICD-10-CM | POA: Diagnosis not present

## 2021-05-24 DIAGNOSIS — R195 Other fecal abnormalities: Secondary | ICD-10-CM | POA: Diagnosis not present

## 2021-05-24 DIAGNOSIS — K5520 Angiodysplasia of colon without hemorrhage: Secondary | ICD-10-CM | POA: Diagnosis not present

## 2021-05-24 DIAGNOSIS — K640 First degree hemorrhoids: Secondary | ICD-10-CM

## 2021-05-24 DIAGNOSIS — D509 Iron deficiency anemia, unspecified: Secondary | ICD-10-CM | POA: Diagnosis not present

## 2021-05-24 DIAGNOSIS — K449 Diaphragmatic hernia without obstruction or gangrene: Secondary | ICD-10-CM | POA: Diagnosis not present

## 2021-05-24 DIAGNOSIS — K31819 Angiodysplasia of stomach and duodenum without bleeding: Secondary | ICD-10-CM

## 2021-05-24 LAB — COMPREHENSIVE METABOLIC PANEL
ALT: 9 U/L (ref 0–35)
AST: 17 U/L (ref 0–37)
Albumin: 4.1 g/dL (ref 3.5–5.2)
Alkaline Phosphatase: 82 U/L (ref 39–117)
BUN: 14 mg/dL (ref 6–23)
CO2: 29 mEq/L (ref 19–32)
Calcium: 10.1 mg/dL (ref 8.4–10.5)
Chloride: 89 mEq/L — ABNORMAL LOW (ref 96–112)
Creatinine, Ser: 1.06 mg/dL (ref 0.40–1.20)
GFR: 50.04 mL/min — ABNORMAL LOW (ref >60.00)
Glucose, Bld: 123 mg/dL — ABNORMAL HIGH (ref 70–99)
Potassium: 3.1 mEq/L — ABNORMAL LOW (ref 3.5–5.1)
Sodium: 130 mEq/L — ABNORMAL LOW (ref 135–145)
Total Bilirubin: 0.7 mg/dL (ref 0.2–1.2)
Total Protein: 7.5 g/dL (ref 6.0–8.3)

## 2021-05-24 LAB — CBC WITH DIFFERENTIAL/PLATELET
Basophils Absolute: 0 10*3/uL (ref 0.0–0.1)
Basophils Relative: 0.3 % (ref 0.0–3.0)
Eosinophils Absolute: 0 10*3/uL (ref 0.0–0.7)
Eosinophils Relative: 0.3 % (ref 0.0–5.0)
HCT: 27.9 % — ABNORMAL LOW (ref 36.0–46.0)
Hemoglobin: 8.6 g/dL — ABNORMAL LOW (ref 12.0–15.0)
Lymphocytes Relative: 7.5 % — ABNORMAL LOW (ref 12.0–46.0)
Lymphs Abs: 0.8 10*3/uL (ref 0.7–4.0)
MCHC: 30.7 g/dL (ref 30.0–36.0)
MCV: 74.4 fl — ABNORMAL LOW (ref 78.0–100.0)
Monocytes Absolute: 1 10*3/uL (ref 0.1–1.0)
Monocytes Relative: 8.9 % (ref 3.0–12.0)
Neutro Abs: 9 10*3/uL — ABNORMAL HIGH (ref 1.4–7.7)
Neutrophils Relative %: 83 % — ABNORMAL HIGH (ref 43.0–77.0)
Platelets: 378 10*3/uL (ref 150.0–400.0)
RBC: 3.74 Mil/uL — ABNORMAL LOW (ref 3.87–5.11)
RDW: 18.1 % — ABNORMAL HIGH (ref 11.5–15.5)
WBC: 10.9 10*3/uL — ABNORMAL HIGH (ref 4.0–10.5)

## 2021-05-24 MED ORDER — SODIUM CHLORIDE 0.9 % IV SOLN
500.0000 mL | Freq: Once | INTRAVENOUS | Status: DC
Start: 2021-05-24 — End: 2021-05-24

## 2021-05-24 NOTE — Op Note (Addendum)
[place] Patient Name: Ann Gamble Procedure Date: 05/24/2021 10:17 AM MRN: [id number] Endoscopist: Jackquline Denmark , MD Age: 80 Referring MD:  Date of Birth: [birth year] Gender: Female Account #: [id number] Procedure:                Upper GI endoscopy Indications:              IDA and H+ stools. Hb dropped from 14 (02/2020) to                            11.4 (09/2020) and then 7.4, MCV 76(05/12/2021), Medicines:                Monitored Anesthesia Care Procedure:                Pre-Anesthesia Assessment:                           - Prior to the procedure, a History and Physical                            was performed, and patient medications and                            allergies were reviewed. The patient's tolerance of                            previous anesthesia was also reviewed. The risks                            and benefits of the procedure and the sedation                            options and risks were discussed with the patient.                            All questions were answered, and informed consent                            was obtained. Prior Anticoagulants: The patient has                            taken no previous anticoagulant or antiplatelet                            agents. ASA Grade Assessment: II - A patient with                            mild systemic disease. After reviewing the risks                            and benefits, the patient was deemed in                            satisfactory condition to undergo the procedure.  After obtaining informed consent, the endoscope was                            passed under direct vision. Throughout the                            procedure, the patient's blood pressure, pulse, and                            oxygen saturations were monitored continuously. The                            Endoscope was introduced through the mouth, and                            advanced  to the second part of duodenum. The upper                            GI endoscopy was accomplished without difficulty.                            The patient tolerated the procedure well. Scope In: Scope Out: Findings:                 The examined esophagus was normal with a wide open                            Schatzki's ring at 32 cm.                           A 3 cm hiatal hernia was present.                           Two 2, 6 mm angiodysplastic lesions with no                            bleeding were found in the gastric body. No active                            bleeding.                           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                            No active bleeding. Complications:            No immediate complications. Estimated Blood Loss:     Estimated blood loss: none. Impression:               -  3 cm hiatal hernia.                           - Two non-bleeding gastric AVMs                           - Five non-bleeding duodenal AVMs                           - No specimens collected. Recommendation:           - Patient has a contact number available for                            emergencies. The signs and symptoms of potential                            delayed complications were discussed with the                            patient. Return to normal activities tomorrow.                            Written discharge instructions were provided to the                            patient.                           - Resume previous diet.                           - Continue present medications.                           - Avoid aspirin, ibuprofen, naproxen, or other                            non-steroidal anti-inflammatory drugs.                           - CBC, CMP today.                           - If  continued IDA, would proceed with                            EGD/enteroscopy at [facility] with APC treatment (APC not                            available at [facility])                           - The findings and recommendations were discussed                            with the patient's family. Jackquline Denmark, MD 05/24/2021 11:18:49 AM This  report has been signed electronically.

## 2021-05-24 NOTE — Progress Notes (Signed)
Pt Drowsy. VSS. To PACU, report to RN. No anesthetic complications noted.  

## 2021-05-24 NOTE — Progress Notes (Signed)
Pt's states no medical or surgical changes since previsit or office visit. VS assessed by C.W 

## 2021-05-24 NOTE — Progress Notes (Signed)
Chief Complaint: IDA  Referring Provider:  Maryella Shivers, MD      ASSESSMENT AND PLAN;   #1. IDA with heme + stools  #2. H/O Constipation  #3. 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  Plan: -EGD/colon for further eval. -Omeprazole 40mg  po QD -Continue MiraLAX 17 g p.o. once a day. -CBC, CMP at time of proc. -Avoid NSAIDs -If still with problems, CT AP with PO/IV contrast -D/W family.   I discussed EGD/Colonoscopy- the indications, risks, alternatives and potential complications including, but not limited to, bleeding, infection, reaction to medication, damage to internal organs, cardiac and/or pulmonary problems, and perforation requiring surgery (1 to 2 in 1000). The possibility that significant findings could be missed was explained. All ? were answered. The patient gives consent to proceed.  HPI:    Ann Gamble is a 80 y.o. female  With RA, GERD, HTN, HLD, OA  With IDA and H+ stools. Hb dropped from 14 (02/2020) to 11.4 (09/2020) and then 7.4, MCV 76(05/12/2021), Nl BUN/Cr.  Has been on ASA and alleve. Stopped omeprazole on her own 6 months ago.  Aspirin and Aleve has been discontinued. S/P IV Fe today ar RH (1st dose), next due 10/13 Heme + stools but brown stools No active bleeding.  No nausea, vomiting, heartburn, regurgitation, odynophagia or dysphagia (none since dilation).  No significant diarrhea or constipation (better with mirlax 17g QD).  No melena or hematochezia. No unintentional weight loss. No abdominal pain.  Has been craving ice.  Denies having any nosebleeds, easy bruisability.  Seen by Dr. Marco Collie.  We worked her into my clinic.  She has a previous history of IDA with Hb 8.8, MCV 80 April 2018 and had responded to iron well  Past GI procedures: Colonoscopy 11/2016 ([facility]): Mild sigmoid diverticulosis, normal normal to TI. EGD 10/2016: Transient Schatzki's ring at 38 cm s/p dil 45fr, small hiatal  hernia.  Negative CLOtest, negative small bowel biopsies for celiac CT Abdo/pelvis 11/2016 negative except for 1.2 cm IPMN unchanged as compared to CT 2014.  Past Medical History:  Diagnosis Date   Allergy    Anemia    Bilateral carpal tunnel syndrome    Cataract immature    right   Chronic gastritis    Constipation    takes stool softener nightly   Foot deformity, bilateral    GERD (gastroesophageal reflux disease)    Hx of colonic polyps    Hyperlipidemia    takes Simvastatin daily   Hypertension    takes Hyzaar and Amlodipine daily   IPMN (intraductal papillary mucinous neoplasm)    1.2cm on CT 11-28-2016   OA (osteoarthritis)    Osteoporosis    RA (rheumatoid arthritis) (HCC)    Seasonal allergies    Sensitive skin    pt states she has highly sensitive skin   Urinary incontinence     Past Surgical History:  Procedure Laterality Date   CATARACT EXTRACTION Bilateral    COLONOSCOPY  11/20/2016   Mild sigmoid diverticulosis. Otherwise normal colonoscopy to terminal ileum   DENTAL SURGERY     ESOPHAGOGASTRODUODENOSCOPY  11/06/2016   Transient schatzki's ring status post esophageal dilatation. Small hiatal hernia. 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   Procedure: TOTAL KNEE ARTHROPLASTY;  Surgeon: Kerin Salen, MD;  Location: [place];  Service: Orthopedics;  Laterality: Left;  DEPUY/SIGMA   TOTAL KNEE ARTHROPLASTY Right  08/05/2018   Procedure: TOTAL KNEE ARTHROPLASTY;  Surgeon: Vickey Huger, MD;  Location: [facility];  Service: Orthopedics;  Laterality: Right;   VAGINAL HYSTERECTOMY  1970's   partial    Family History  Problem Relation Age of Onset   Thyroid disease Mother    Alzheimer's disease Mother    Stroke Brother    Diabetes Son    Anesthesia problems Neg Hx    Hypotension Neg Hx    Pseudochol deficiency Neg Hx    Malignant hyperthermia Neg Hx    Colon cancer Neg Hx    Rectal cancer Neg Hx    Stomach cancer Neg Hx      Social History   Tobacco Use   Smoking status: Former    Packs/day: 0.50    Years: 43.00    Pack years: 21.50    Types: Cigarettes    Quit date: 08/15/2003    Years since quitting: 17.7   Smokeless tobacco: Never  Vaping Use   Vaping Use: Never used  Substance Use Topics   Alcohol use: Not Currently    Comment: 10/31/11 "beer or wine rarely; last drink ages ago"   Drug use: Never    Current Outpatient Medications  Medication Sig Dispense Refill   amLODipine (NORVASC) 10 MG tablet Take 10 mg by mouth every evening.      Cetirizine HCl (ZYRTEC PO) Take by mouth.     hydrochlorothiazide (HYDRODIURIL) 25 MG tablet Take 25 mg by mouth daily.      hydroxychloroquine (PLAQUENIL) 200 MG tablet TAKE 1 TABLET BY MOUTH TWICE DAILY, MONDAY THROUGH FRIDAY, DO NOT TAKE ON SATURDAY OR SUNDAY 120 tablet 0   losartan (COZAAR) 100 MG tablet Take 100 mg by mouth daily.      MULTIPLE VITAMIN PO Take 1 tablet by mouth daily.      MYRBETRIQ 25 MG TB24 tablet 50 mg daily.     MYRBETRIQ 50 MG TB24 tablet Take 50 mg by mouth daily.     omeprazole (PRILOSEC) 40 MG capsule Take 40 mg by mouth daily.     Ophthalmic Irrigation Solution (OCUSOFT EYE [place] OP) Apply to eye.      simvastatin (ZOCOR) 10 MG tablet Take 10 mg by mouth every evening.      sodium chloride (OCEAN) 0.65 % nasal spray Place 2 sprays into the nose as needed for congestion.      ASPIRIN 81 PO Take by mouth daily. (Patient not taking: Reported on 05/24/2021)     HYDROcodone-acetaminophen (NORCO/VICODIN) 5-325 MG tablet Take 1 tablet by mouth 2 (two) times daily. (Patient not taking: Reported on 05/24/2021)     Polyethylene Glycol 3350 (MIRALAX PO) Take by mouth daily. (Patient not taking: Reported on 05/24/2021)     Current Facility-Administered Medications  Medication Dose Route Frequency Provider Last Rate Last Admin   0.9 %  sodium chloride infusion  500 mL Intravenous Once Jackquline Denmark, MD        Allergies  Allergen Reactions    Lactose Intolerance (Gi) Other (See Comments)    Gas pain   Antihistamines, Chlorpheniramine-Type Other (See Comments)    "Made her real dry in her throat, nausea"    Other Rash    Wool and Sun Ultraviolet rays   Tape Rash    Review of Systems:  Constitutional: Denies fever, chills, diaphoresis, appetite change and has fatigue.  HEENT: Denies photophobia, eye pain, redness, hearing loss, ear pain, congestion, sore throat, rhinorrhea, sneezing, mouth sores, neck pain, neck  stiffness and tinnitus.   Respiratory: Denies SOB, DOE, cough, chest tightness,  and wheezing.   Cardiovascular: Denies chest pain, palpitations and leg swelling.  Genitourinary: Denies dysuria, urgency, frequency, hematuria, flank pain and difficulty urinating.  Musculoskeletal: Has myalgias, back pain, joint swelling, arthralgias and gait problem.  Skin: No rash.  Neurological: Denies dizziness, seizures, syncope, weakness, light-headedness, numbness and headaches.  Hematological: Denies adenopathy. Easy bruising, personal or family bleeding history  Psychiatric/Behavioral: No anxiety or depression     Physical Exam:    BP (!) 126/56   Pulse 92   Temp 98.4 F (36.9 C) (Skin)   Resp (!) 24   Ht 4\' 11"  (1.499 m)   Wt 151 lb (68.5 kg)   SpO2 100%   BMI 30.50 kg/m  Wt Readings from Last 3 Encounters:  05/24/21 151 lb (68.5 kg)  05/19/21 151 lb (68.5 kg)  05/12/21 152 lb 12.8 oz (69.3 kg)   Constitutional:  Well-developed, in no acute distress. Psychiatric: Normal mood and affect. Behavior is normal. HEENT: Pupils normal.  Conjunctivae are normal. No scleral icterus. Cardiovascular: Normal rate, regular rhythm. No edema Pulmonary/chest: Effort normal and breath sounds normal. No wheezing, rales or rhonchi. Abdominal: Soft, nondistended. Nontender. Bowel sounds active throughout. There are no masses palpable. No hepatomegaly. Rectal: Performed by Dr. Nyra Capes.  Brown heme positive stools. Neurological:  Alert and oriented to person place and time. Skin: Skin is warm and dry. No rashes noted.  Data Reviewed: I have personally reviewed following labs and imaging studies  CBC: CBC Latest Ref Rng & Units 05/12/2021 09/15/2020 02/23/2020  WBC 3.8 - 10.8 Thousand/uL 7.2 5.3 5.0  Hemoglobin 11.7 - 15.5 g/dL 7.4(L) 11.4(L) 14.1  Hematocrit 35.0 - 45.0 % 26.9(L) 34.9(L) 42.0  Platelets 140 - 400 Thousand/uL 402(H) 280 238    CMP: CMP Latest Ref Rng & Units 05/12/2021 09/15/2020 02/23/2020  Glucose 65 - 99 mg/dL 94 88 87  BUN 7 - 25 mg/dL 21 25 18   Creatinine 0.60 - 1.00 mg/dL 0.97 0.82 0.81  Sodium 135 - 146 mmol/L 137 140 140  Potassium 3.5 - 5.3 mmol/L 4.1 3.7 4.3  Chloride 98 - 110 mmol/L 101 103 104  CO2 20 - 32 mmol/L 27 26 27   Calcium 8.6 - 10.4 mg/dL 10.1 10.6(H) 10.4  Total Protein 6.1 - 8.1 g/dL 6.9 6.8 6.9  Total Bilirubin 0.2 - 1.2 mg/dL 0.3 0.4 0.4  Alkaline Phos 38 - 126 U/L - - -  AST 10 - 35 U/L 17 21 18   ALT 6 - 29 U/L 11 15 13       Carmell Austria, MD 05/24/2021, 10:28 AM  Cc: Maryella Shivers, MD

## 2021-05-24 NOTE — Op Note (Addendum)
[facility] Patient Name: Ann Gamble Procedure Date: 05/24/2021 10:16 AM MRN: [id number] Endoscopist: Jackquline Denmark , MD Age: 80 Referring MD:  Date of Birth: 04/15/1941 Gender: Female Account #: [id number] Procedure:                Colonoscopy Indications:              IDA and H+ stools. Hb dropped from 14 (02/2020) to                            11.4 (09/2020) and then 7.4, MCV 76(05/12/2021), Medicines:                Monitored Anesthesia Care Procedure:                Pre-Anesthesia Assessment:                           - Prior to the procedure, a History and Physical                            was performed, and patient medications and                            allergies were reviewed. The patient's tolerance of                            previous anesthesia was also reviewed. The risks                            and benefits of the procedure and the sedation                            options and risks were discussed with the patient.                            All questions were answered, and informed consent                            was obtained. Prior Anticoagulants: The patient has                            taken no previous anticoagulant or antiplatelet                            agents. ASA Grade Assessment: II - A patient with                            mild systemic disease. After reviewing the risks                            and benefits, the patient was deemed in                            satisfactory condition to undergo the procedure.  After obtaining informed consent, the colonoscope                            was passed under direct vision. Throughout the                            procedure, the patient's blood pressure, pulse, and                            oxygen saturations were monitored continuously. The                            Olympus PCF-H190DL (#[id number]) Colonoscope was                            introduced  through the anus and advanced to the the                            cecum, identified by appendiceal orifice and                            ileocecal valve. The colonoscopy was performed                            without difficulty. The patient tolerated the                            procedure well. The quality of the bowel                            preparation was adequate to identify polyps. She                            could only tolerate 1 of 2 doses of prep. Some                            areas with solid vegetable material and stool                            especially in the cecum making it harder to                            visualize. Of note that small and flat lesions                            could have been missed. Overall, over 85-90% of the                            colonic mucosa was visualized satisfactorily. The                            ileocecal valve, appendiceal orifice, and rectum  were photographed. Scope In: 10:47:07 AM Scope Out: 11:11:14 AM Scope Withdrawal Time: 0 hours 13 minutes 19 seconds  Total Procedure Duration: 0 hours 24 minutes 7 seconds  Findings:                 A few small diverticula were found in the sigmoid                            colon.                           Two small angiodysplastic lesions without bleeding                            were found in the cecum.                           Non-bleeding internal hemorrhoids were found during                            retroflexion. The hemorrhoids were small and Grade                            I (internal hemorrhoids that do not prolapse).                           The exam was otherwise without abnormality on                            direct and retroflexion views. The colon was highly                            redundant. Complications:            No immediate complications. Estimated Blood Loss:     Estimated blood loss: none. Impression:                - Mild sigmoid diverticulosis.                           - Two non-bleeding colonic angiodysplastic lesions.                           - Non-bleeding internal hemorrhoids.                           - The examination was otherwise grossly normal on                            direct and retroflexion views.                           - No specimens collected. Recommendation:           - Patient has a contact number available for                            emergencies. The signs and symptoms of potential  delayed complications were discussed with the                            patient. Return to normal activities tomorrow.                            Written discharge instructions were provided to the                            patient.                           - Resume previous diet.                           - Continue present medications.                           - Repeat colonoscopy PRN.                           - Resume iron tablets.                           - The findings and recommendations were discussed                            with the patient's family. Jackquline Denmark, MD 05/24/2021 11:23:55 AM This report has been signed electronically.

## 2021-05-24 NOTE — Patient Instructions (Signed)
HANDOUTS PROVIDED ON: HIATAL HERNIA, DIVERTICULOSIS, & HEMORRHOIDS  You may resume your previous diet and medication schedule.  AVOID ASPIRIN, IBUPROFEN, NAPROXEN, AND ANY OTHER NSAIDs.  Thank you for allowing [place] to care for you today!!!  YOU HAD AN ENDOSCOPIC PROCEDURE TODAY AT [facility]:   Refer to the procedure report that was given to you for any specific questions about what was found during the examination.  If the procedure report does not answer your questions, please call your gastroenterologist to clarify.  If you requested that your care partner not be given the details of your procedure findings, then the procedure report has been included in a sealed envelope for you to review at your convenience later.  YOU SHOULD EXPECT: Some feelings of bloating in the abdomen. Passage of more gas than usual.  Walking can help get rid of the air that was put into your GI tract during the procedure and reduce the bloating. If you had a lower endoscopy (such as a colonoscopy or flexible sigmoidoscopy) you may notice spotting of blood in your stool or on the toilet paper. If you underwent a bowel prep for your procedure, you may not have a normal bowel movement for a few days.  Please Note:  You might notice some irritation and congestion in your nose or some drainage.  This is from the oxygen used during your procedure.  There is no need for concern and it should clear up in a day or so.  SYMPTOMS TO REPORT IMMEDIATELY:  Following lower endoscopy (colonoscopy or flexible sigmoidoscopy):  Excessive amounts of blood in the stool  Significant tenderness or worsening of abdominal pains  Swelling of the abdomen that is new, acute  Fever of 100F or higher  Following upper endoscopy (EGD)  Vomiting of blood or coffee ground material  New chest pain or pain under the shoulder blades  Painful or persistently difficult swallowing  New shortness of breath  Fever of 100F or  higher  Black, tarry-looking stools  For urgent or emergent issues, a gastroenterologist can be reached at any hour by calling [phone]. Do not use MyChart messaging for urgent concerns.    DIET:  We do recommend a small meal at first, but then you may proceed to your regular diet.  Drink plenty of fluids but you should avoid alcoholic beverages for 24 hours.  ACTIVITY:  You should plan to take it easy for the rest of today and you should NOT DRIVE or use heavy machinery until tomorrow (because of the sedation medicines used during the test).    FOLLOW UP: Our staff will call the number listed on your records THURSDAY MORNING BETWEEN 7:15 AM AND 8:15 AM following your procedure to check on you and address any questions or concerns that you may have regarding the information given to you following your procedure. If we do not reach you, we will leave a message.  We will attempt to reach you two times.  During this call, we will ask if you have developed any symptoms of COVID 19. If you develop any symptoms (ie: fever, flu-like symptoms, shortness of breath, cough etc.) before then, please call [phone].  If you test positive for Covid 19 in the 2 weeks post procedure, please call and report this information to [place].    If any biopsies were taken you will be contacted by phone or by letter within the next 1-3 weeks.  Please call us at [phone] if you  have not heard about the biopsies in 3 weeks.    SIGNATURES/CONFIDENTIALITY: You and/or your care partner have signed paperwork which will be entered into your electronic medical record.  These signatures attest to the fact that that the information above on your After Visit Summary has been reviewed and is understood.  Full responsibility of the confidentiality of this discharge information lies with you and/or your care-partner.

## 2021-05-24 NOTE — Progress Notes (Signed)
Per patient unsure what last BM looked like. States it was all liquid. MD made aware.

## 2021-05-26 ENCOUNTER — Telehealth: Payer: Self-pay

## 2021-05-26 NOTE — Telephone Encounter (Signed)
Called [phone] and left a message we tried to reach pt for a follow up call. maw

## 2021-05-26 NOTE — Telephone Encounter (Signed)
Second attempt follow up call to pt, no answer.  

## 2021-06-07 ENCOUNTER — Other Ambulatory Visit: Payer: Self-pay

## 2021-06-07 ENCOUNTER — Other Ambulatory Visit: Payer: Self-pay | Admitting: Physician Assistant

## 2021-06-07 DIAGNOSIS — M0609 Rheumatoid arthritis without rheumatoid factor, multiple sites: Secondary | ICD-10-CM

## 2021-06-07 MED ORDER — POTASSIUM CHLORIDE CRYS ER 20 MEQ PO TBCR: 20.0000 meq | EXTENDED_RELEASE_TABLET | Freq: Every day | ORAL | 0 refills | Status: DC

## 2021-06-07 NOTE — Telephone Encounter (Signed)
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

## 2021-08-29 ENCOUNTER — Other Ambulatory Visit: Payer: Self-pay | Admitting: Physician Assistant

## 2021-08-29 DIAGNOSIS — M0609 Rheumatoid arthritis without rheumatoid factor, multiple sites: Secondary | ICD-10-CM

## 2021-08-30 NOTE — Telephone Encounter (Signed)
Next Visit: 10/11/2021   Last Visit: 05/12/2021   Labs: 05/24/2021 WBC 10.9, RBC 3.74, Hgb 8.6, Hct 27.9, MCv 74.4, RDW 18.1, Neutrophils Relative % 83.0, Lymphocytes Relative 7.5, Neutro Abs 9.0, Sodium 130, Potassium 3.1, Chloride 89, Glucose 123, GFR 50.04   Eye exam: 10/15/2020    Current Dose per office note 05/12/2021:  Plaquenil 200 mg 1 tablet by mouth twice daily Monday through Friday only   TD:[id number] arthritis of multiple sites with negative rheumatoid factor    Last Fill: 06/07/2021   Okay to refill Plaquenil?

## 2021-09-27 NOTE — Progress Notes (Signed)
Office Visit Note  Patient: Ann Gamble             Date of Birth: 19-Jan-1941           MRN: [id number]             PCP: Maryella Shivers, MD Referring: Maryella Shivers, MD Visit Date: 10/11/2021 Occupation: @GUAROCC @  Subjective:  Pain in both hands   History of Present Illness: Ann Gamble is a 81 y.o. female with history of seronegative rheumatoid arthritis and osteoarthritis.  She is taking Plaquenil 200 mg 1 tablet by mouth twice daily Monday through Friday.  She continues to tolerate Plaquenil without any side effects and has not missed any doses recently.  She presents today experiencing increased pain in multiple joints especially both shoulders, both wrists, both hands.  She has no significant swelling in her right wrist over the past couple of months.  She has had difficulty extending her right wrist due to severity of pain and swelling.  She has tried taking naproxen as needed for symptomatic relief which improved her symptoms temporarily.   Activities of Daily Living:  Patient reports morning stiffness for 24 hours.   Patient Reports nocturnal pain.  Difficulty dressing/grooming: Reports Difficulty climbing stairs: Reports Difficulty getting out of chair: Reports Difficulty using hands for taps, buttons, cutlery, and/or writing: Reports  Review of Systems  Constitutional:  Positive for fatigue.  HENT:  Positive for mouth dryness. Negative for mouth sores and nose dryness.   Eyes:  Negative for pain, visual disturbance and dryness.  Respiratory:  Positive for shortness of breath. Negative for cough, hemoptysis and difficulty breathing.   Cardiovascular:  Positive for swelling in legs/feet. Negative for chest pain, palpitations and hypertension.  Gastrointestinal:  Negative for blood in stool, constipation and diarrhea.  Endocrine: Negative for increased urination.  Genitourinary:  Negative for difficulty urinating and painful urination.  Musculoskeletal:   Positive for joint pain, gait problem, joint pain, joint swelling, muscle weakness, morning stiffness and muscle tenderness. Negative for myalgias and myalgias.  Skin:  Negative for color change, pallor, rash, hair loss, nodules/bumps, skin tightness, ulcers and sensitivity to sunlight.  Neurological:  Negative for dizziness, headaches and weakness.  Hematological:  Positive for bruising/bleeding tendency. Negative for swollen glands.  Psychiatric/Behavioral:  Negative for depressed mood and sleep disturbance. The patient is not nervous/anxious.    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 factor ([place]) 09/04/2016   High risk medication use 09/04/2016   Primary osteoarthritis of right knee 09/04/2016   History of total knee replacement, left 09/04/2016   Primary osteoarthritis of both hands 09/04/2016   Primary osteoarthritis of both feet 09/04/2016   Essential hypertension 09/04/2016   Dyslipidemia 09/04/2016    Past Medical History:  Diagnosis Date   Allergy    Anemia    Bilateral carpal tunnel syndrome    Cataract immature    right   Chronic gastritis    Constipation    takes stool softener nightly   Foot deformity, bilateral    GERD (gastroesophageal reflux disease)    Hx of colonic polyps    Hyperlipidemia    takes Simvastatin daily   Hypertension    takes Hyzaar and Amlodipine daily   IPMN (intraductal papillary mucinous neoplasm)    1.2cm on CT 11-28-2016   OA (osteoarthritis)    Osteoporosis  RA (rheumatoid arthritis) (HCC)    Seasonal allergies    Sensitive skin    pt states she has highly sensitive skin   Urinary incontinence     Family History  Problem Relation Age of Onset   Thyroid disease Mother    Alzheimer's disease Mother    Stroke Brother    Diabetes Son    Anesthesia problems Neg Hx    Hypotension Neg Hx     Pseudochol deficiency Neg Hx    Malignant hyperthermia Neg Hx    Colon cancer Neg Hx    Rectal cancer Neg Hx    Stomach cancer Neg Hx    Past Surgical History:  Procedure Laterality Date   CATARACT EXTRACTION Bilateral    COLONOSCOPY  11/20/2016   Mild sigmoid diverticulosis. Otherwise normal colonoscopy to terminal ileum   DENTAL SURGERY     ESOPHAGOGASTRODUODENOSCOPY  11/06/2016   Transient schatzki's ring status post esophageal dilatation. Small hiatal hernia. 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   Procedure: TOTAL KNEE ARTHROPLASTY;  Surgeon: Kerin Salen, MD;  Location: [facility];  Service: Orthopedics;  Laterality: Left;  DEPUY/SIGMA   TOTAL KNEE ARTHROPLASTY Right 08/05/2018   Procedure: TOTAL KNEE ARTHROPLASTY;  Surgeon: Vickey Huger, MD;  Location: [facility];  Service: Orthopedics;  Laterality: Right;   VAGINAL HYSTERECTOMY  1970's   partial   Social History   Social History Narrative   Not on file   Immunization History  Administered Date(s) Administered   Moderna Sars-Covid-2 Vaccination 09/18/2019, 10/14/2019, 07/19/2020     Objective: Vital Signs: BP 122/65 (BP Location: Left Arm, Patient Position: Sitting, Cuff Size: Normal)    Pulse 92    Resp 17    Ht 5' (1.524 m)    Wt 152 lb (68.9 kg)    BMI 29.69 kg/m    Physical Exam Vitals and nursing note reviewed.  Constitutional:      Appearance: She is well-developed.  HENT:     Head: Normocephalic and atraumatic.  Eyes:     Conjunctiva/sclera: Conjunctivae normal.  Cardiovascular:     Rate and Rhythm: Normal rate and regular rhythm.     Heart sounds: Normal heart sounds.  Pulmonary:     Effort: Pulmonary effort is normal.     Breath sounds: Normal breath sounds.  Abdominal:     General: Bowel sounds are normal.     Palpations: Abdomen is soft.  Musculoskeletal:     Cervical back: Normal range of motion.  Lymphadenopathy:     Cervical: No cervical adenopathy.  Skin:     General: Skin is warm and dry.     Capillary Refill: Capillary refill takes less than 2 seconds.  Neurological:     Mental Status: She is alert and oriented to person, place, and time.  Psychiatric:        Behavior: Behavior normal.     Musculoskeletal Exam: C-spine has limited range of motion with lateral rotation.  Thoracic kyphosis noted.  Painful range of motion and stiffness with range of motion of both shoulders.  Elbow joints have good range of motion with no discomfort.  Extensor tenosynovitis of the right wrist noted.  Tenderness and synovitis of the left wrist.  Tenderness and synovitis of the right second MCP and PIP joint and the left second PIP joint.  PIP and DIP thickening as well as subluxation consistent with osteoarthritis of both hands.  Incomplete fist formation bilaterally.  Hip joints  have slightly limited range of motion bilaterally.  Painful range of motion of both knee replacements.  Ankle joints have good range of motion with no tenderness or joint swelling.  CDAI Exam: CDAI Score: 11  Patient Global: 5 mm; Provider Global: 5 mm Swollen: 5 ; Tender: 5  Joint Exam 10/11/2021      Right  Left  Wrist  Swollen Tender  Swollen Tender  MCP 2  Swollen Tender     PIP 2  Swollen Tender  Swollen Tender     Investigation: No additional findings.  Imaging: No results found.  Recent Labs: Lab Results  Component Value Date   WBC 10.9 (H) 05/24/2021   HGB 8.6 Repeated and verified X2. (L) 05/24/2021   PLT 378.0 05/24/2021   NA 130 (L) 05/24/2021   K 3.1 (L) 05/24/2021   CL 89 (L) 05/24/2021   CO2 29 05/24/2021   GLUCOSE 123 (H) 05/24/2021   BUN 14 05/24/2021   CREATININE 1.06 05/24/2021   BILITOT 0.7 05/24/2021   ALKPHOS 82 05/24/2021   AST 17 05/24/2021   ALT 9 05/24/2021   PROT 7.5 05/24/2021   ALBUMIN 4.1 05/24/2021   CALCIUM 10.1 05/24/2021   GFRAA 79 09/15/2020    Speciality Comments: PLQ Eye Exam: 10/15/2020 WNL @ [facility] Follow up in  12  months   Procedures:  No procedures performed Allergies: Lactose intolerance (gi); Antihistamines, chlorpheniramine-type; Other; and Tape   Assessment / Plan:     Visit Diagnoses: Rheumatoid arthritis of multiple sites with negative rheumatoid factor (HCC) - +RF, +CCP: Patient presents today with severe pain, stiffness, and inflammation in both hands and both wrist joints.  She has extensor tenosynovitis of the right wrist and tenderness and synovitis of the left wrist.  She has had difficulty performing ADLs due to the severity of pain and inflammation in her right hand.  She remains on Plaquenil 200 mg 1 tablet by mouth twice daily Monday through Friday.  She is tolerating Plaquenil without any side effects and has not missed any doses recently.  She has been taking naproxen as needed for symptomatic relief. Different treatment options were discussed today in detail.  Indications, contraindications, potential side effects of Arava were discussed.  All questions were addressed and consent was obtained.  She was started on Arava 10 mg 1 tablet by mouth daily.  In the future we can consider increasing the dose if tolerated and if her lab work remained stable.  She will remain on Plaquenil as combination therapy.  A prednisone taper will be sent to the pharmacy today to manage her current flare.  She was advised to take prednisone in the morning with food and to avoid the use of NSAIDs while taking prednisone.  She will return for lab work in 2 weeks and a follow-up visit in 6 weeks to assess her response.  Medication counseling:   Baseline Immunosuppressant Therapy Labs  Serum Protein Electrophoresis Latest Ref Rng & Units 05/24/2021  Total Protein 6.0 - 8.3 g/dL 7.5   Patient was counseled on the purpose, proper use, and adverse effects of leflunomide including risk of infection, nausea/diarrhea/weight loss, increase in blood pressure, rash, hair loss, tingling in the hands and feet, and signs and  symptoms of interstitial lung disease.   Also counseled on [place] warning of liver injury and importance of avoiding alcohol while on therapy. Discussed that there is the possibility of an increased risk of malignancy but it is not well understood  if this increased risk is due to the medication or the disease state.  Counseled patient to avoid live vaccines. Recommend annual influenza, Pneumovax 23, Prevnar 13, and Shingrix as indicated.   Discussed the importance of frequent monitoring of liver function and blood count.  Standing orders placed.  Discussed importance of birth control while on leflunomide due to risk of congenital abnormalities, and patient confirms she is postmenopausal.  Provided patient with educational materials on leflunomide and answered all questions.  Patient consented to [place] use, and consent will be uploaded into the media tab.   Patient dose will be 10 mg daily.  Prescription pending lab results and/or insurance approval.  High risk medication use - Plaquenil 200 mg 1 tablet by mouth twice daily Monday through Friday only.  She will be adding on Arava 10 mg 1 tablet by mouth daily.    PLQ Eye Exam: 10/15/2020 WNL @ [facility] Follow up in 12 months.  CBC and CMP drawn on 05/24/2021.  Order for CBC and CMP released today.  The following baseline immunosuppressive labs were also obtained today.  She will return for lab work in 2 weeks, 2 months, then every 3 months to monitor for drug toxicity.   - Plan: CBC with Differential/Platelet, COMPLETE METABOLIC PANEL WITH GFR, QuantiFERON-TB Gold Plus, Serum protein electrophoresis with reflex, IgG, IgA, IgM, Hepatitis B core antibody, IgM, Hepatitis B surface antigen, Hepatitis C antibody Discussed the importance of holding Arava if she develops signs or symptoms of an infection and to resume once the infection is completely cleared.  Screening for tuberculosis - Order for TB gold released today. Plan: QuantiFERON-TB Gold  Plus  Extensor tenosynovitis of right wrist: She presents today with extensor tenosynovitis of the right wrist.  She has been experiencing recurrent flares for the past 4 to 5 months.  Her symptoms are exacerbated by overuse activities.  She has had difficulty using her right hand due to severity of pain.  She is having difficulty performing ADLs which has been discouraging.  She remains on Plaquenil as prescribed and has not missed any doses recently.  Discussed that she will require combination therapy to manage her rheumatoid arthritis.  Different treatment options were discussed today in detail.  We will try low-dose Arava 10 mg 1 tablet daily in combination with Plaquenil.  A prednisone taper was sent to the pharmacy today to alleviate her current flare.  Discussed the importance of taking prednisone in the morning with breakfast and to avoid the use of NSAIDs while taking prednisone.  Primary osteoarthritis of both hands - Severe osteoarthritis in her bilateral hands with subluxation of PIP and DIP joints.  She presents today with severe pain, stiffness, and swelling in both hands.  She is currently having a rheumatoid arthritis flare involving extensor tenosynovitis of the right wrist.  A prednisone taper will be sent to the pharmacy as discussed above.  History of total right knee replacement: Chronic pain.  History of total knee replacement, left: Chronic pain.  Primary osteoarthritis of both feet: She experiences intermittent discomfort and stiffness in both feet.  She has good range of motion of both ankle joints on examination today with no tenderness or joint swelling.  History of hypertension: Blood pressure was 122/65 today in the office.  History of hyperlipidemia  Orders: Orders Placed This Encounter  Procedures   CBC with Differential/Platelet   COMPLETE METABOLIC PANEL WITH GFR   QuantiFERON-TB Gold Plus   Serum protein electrophoresis with reflex  IgG, IgA, IgM    Hepatitis B core antibody, IgM   Hepatitis B surface antigen   Hepatitis C antibody   Meds ordered this encounter  Medications   predniSONE (DELTASONE) 5 MG tablet    Sig: Take 4 tablets by mouth daily x4 days, 3 tablets daily x4 days, 2 tablets daily x4 days, 1 tablet daily x4 days.    Dispense:  40 tablet    Refill:  0     Follow-Up Instructions: Return in 6 weeks (on 11/22/2021) for Rheumatoid arthritis, Osteoarthritis.   Ofilia Neas, PA-C  Note - This record has been created using Dragon software.  Chart creation errors have been sought, but may not always  have been located. Such creation errors do not reflect on  the standard of medical care.

## 2021-10-11 ENCOUNTER — Encounter: Payer: Self-pay | Admitting: Physician Assistant

## 2021-10-11 ENCOUNTER — Ambulatory Visit (INDEPENDENT_AMBULATORY_CARE_PROVIDER_SITE_OTHER): Payer: Medicare Other | Admitting: Physician Assistant

## 2021-10-11 ENCOUNTER — Other Ambulatory Visit: Payer: Self-pay

## 2021-10-11 VITALS — BP 122/65 | HR 92 | Resp 17 | Ht 60.0 in | Wt 152.0 lb

## 2021-10-11 DIAGNOSIS — Z111 Encounter for screening for respiratory tuberculosis: Secondary | ICD-10-CM | POA: Diagnosis not present

## 2021-10-11 DIAGNOSIS — Z96651 Presence of right artificial knee joint: Secondary | ICD-10-CM

## 2021-10-11 DIAGNOSIS — Z79899 Other long term (current) drug therapy: Secondary | ICD-10-CM

## 2021-10-11 DIAGNOSIS — Z8679 Personal history of other diseases of the circulatory system: Secondary | ICD-10-CM

## 2021-10-11 DIAGNOSIS — Z8639 Personal history of other endocrine, nutritional and metabolic disease: Secondary | ICD-10-CM

## 2021-10-11 DIAGNOSIS — M19041 Primary osteoarthritis, right hand: Secondary | ICD-10-CM

## 2021-10-11 DIAGNOSIS — M65831 Other synovitis and tenosynovitis, right forearm: Secondary | ICD-10-CM | POA: Diagnosis not present

## 2021-10-11 DIAGNOSIS — M19072 Primary osteoarthritis, left ankle and foot: Secondary | ICD-10-CM

## 2021-10-11 DIAGNOSIS — M0609 Rheumatoid arthritis without rheumatoid factor, multiple sites: Secondary | ICD-10-CM | POA: Diagnosis not present

## 2021-10-11 DIAGNOSIS — Z96652 Presence of left artificial knee joint: Secondary | ICD-10-CM

## 2021-10-11 DIAGNOSIS — M19071 Primary osteoarthritis, right ankle and foot: Secondary | ICD-10-CM

## 2021-10-11 DIAGNOSIS — M19042 Primary osteoarthritis, left hand: Secondary | ICD-10-CM

## 2021-10-11 MED ORDER — PREDNISONE 5 MG PO TABS: ORAL_TABLET | ORAL | 0 refills | Status: DC

## 2021-10-11 NOTE — Telephone Encounter (Signed)
Pending lab results, patient will be starting arava 10mg  by mouth daily per Hazel Sams, PA-C. Thanks!

## 2021-10-11 NOTE — Patient Instructions (Signed)
Standing Labs We placed an order today for your standing lab work.   Please have your standing labs drawn in 2 weeks, 2 months and every 3 months   If possible, please have your labs drawn 2 weeks prior to your appointment so that the provider can discuss your results at your appointment.  Please note that you may see your imaging and lab results in [facility] before we have reviewed them. We may be awaiting multiple results to interpret others before contacting you. Please allow our office up to 72 hours to thoroughly review all of the results before contacting the office for clarification of your results.  We have open lab daily: Monday through Thursday from 1:30-4:30 PM and Friday from 1:30-4:00 PM at the office of Dr. Bo Merino, [facility].   Please be advised, all patients with office appointments requiring lab work will take precedent over walk-in lab work.  If possible, please come for your lab work on Monday and Friday afternoons, as you may experience shorter wait times. The office is located at [address], [place], [place], [place] [address] No appointment is necessary.   Labs are drawn by [facility]. Please bring your co-pay at the time of your lab draw.  You may receive a bill from [facility] for your lab work.  Please note if you are on Hydroxychloroquine and and an order has been placed for a Hydroxychloroquine level, you will need to have it drawn 4 hours or more after your last dose.  If you wish to have your labs drawn at another location, please call the office 24 hours in advance to send orders.  If you have any questions regarding directions or hours of operation,  please call [phone].   As a reminder, please drink plenty of water prior to coming for your lab work. Thanks!    Leflunomide tablets What is this medication? LEFLUNOMIDE (le FLOO na mide) is for rheumatoid arthritis. This medicine may be used for other purposes; ask your health care  provider or pharmacist if you have questions. COMMON BRAND NAME(S): Arava What should I tell my care team before I take this medication? They need to know if you have any of these conditions: diabetes have a fever or infection high blood pressure immune system problems kidney disease liver disease low blood cell counts, like low white cell, platelet, or red cell counts lung or breathing disease, like asthma recently received or scheduled to receive a vaccine receiving treatment for cancer skin conditions or sensitivity tingling of the fingers or toes, or other nerve disorder tuberculosis an unusual or allergic reaction to leflunomide, teriflunomide, other medicines, food, dyes, or preservatives pregnant or trying to get pregnant breast-feeding How should I use this medication? Take this medicine by mouth with a full glass of water. Follow the directions on the prescription label. Take your medicine at regular intervals. Do not take your medicine more often than directed. Do not stop taking except on your doctor's advice. Talk to your pediatrician regarding the use of this medicine in children. Special care may be needed. Overdosage: If you think you have taken too much of this medicine contact a poison control center or emergency room at once. NOTE: This medicine is only for you. Do not share this medicine with others. What if I miss a dose? If you miss a dose, take it as soon as you can. If it is almost time for your next dose, take only that dose. Do not take double  or extra doses. What may interact with this medication? Do not take this medicine with any of the following medications: teriflunomide This medicine may also interact with the following medications: alosetron birth control pills caffeine cefaclor certain medicines for diabetes like nateglinide, repaglinide, rosiglitazone, pioglitazone certain medicines for high cholesterol like atorvastatin, pravastatin,  rosuvastatin, simvastatin charcoal cholestyramine ciprofloxacin duloxetine furosemide ketoprofen live virus vaccines medicines that increase your risk for infection methotrexate mitoxantrone paclitaxel penicillin theophylline tizanidine warfarin This list may not describe all possible interactions. Give your health care provider a list of all the medicines, herbs, non-prescription drugs, or dietary supplements you use. Also tell them if you smoke, drink alcohol, or use illegal drugs. Some items may interact with your medicine. What should I watch for while using this medication? Visit your health care provider for regular checks on your progress. Tell your doctor or health care provider if your symptoms do not start to get better or if they get worse. You may need blood work done while you are taking this medicine. This medicine may cause serious skin reactions. They can happen weeks to months after starting the medicine. Contact your health care provider right away if you notice fevers or flu-like symptoms with a rash. The rash may be red or purple and then turn into blisters or peeling of the skin. Or, you might notice a red rash with swelling of the face, lips or lymph nodes in your neck or under your arms. This medicine may stay in your body for up to 2 years after your last dose. Tell your doctor about any unusual side effects or symptoms. A medicine can be given to help lower your blood levels of this medicine more quickly. Women must use effective birth control with this medicine. There is a potential for serious side effects to an unborn child. Do not become pregnant while taking this medicine. Inform your doctor if you wish to become pregnant. This medicine remains in your blood after you stop taking it. You must continue using effective birth control until the blood levels have been checked and they are low enough. A medicine can be given to help lower your blood levels of this  medicine more quickly. Immediately talk to your doctor if you think you may be pregnant. You may need a pregnancy test. Talk to your health care provider or pharmacist for more information. You should not receive certain vaccines during your treatment and for a certain time after your treatment with this medication ends. Talk to your health care provider for more information. What side effects may I notice from receiving this medication? Side effects that you should report to your doctor or health care professional as soon as possible: allergic reactions like skin rash, itching or hives, swelling of the face, lips, or tongue breathing problems cough increased blood pressure low blood counts - this medicine may decrease the number of white blood cells and platelets. You may be at increased risk for infections and bleeding. pain, tingling, numbness in the hands or feet rash, fever, and swollen lymph nodes redness, blistering, peeing or loosening of the skin, including inside the mouth signs of decreased platelets or bleeding - bruising, pinpoint red spots on the skin, black, tarry stools, blood in urine signs of infection - fever or chills, cough, sore throat, pain or trouble passing urine signs and symptoms of liver injury like dark yellow or brown urine; general ill feeling or flu-like symptoms; light-colored stools; loss of appetite; nausea; right upper belly  pain; unusually weak or tired; yellowing of the eyes or skin trouble passing urine or change in the amount of urine vomiting Side effects that usually do not require medical attention (report to your doctor or health care professional if they continue or are bothersome): diarrhea hair thinning or loss headache nausea tiredness This list may not describe all possible side effects. Call your doctor for medical advice about side effects. You may report side effects to FDA at 1-800-FDA-1088. Where should I keep my medication? Keep out of  the reach of children. Store at room temperature between 15 and 30 degrees C (59 and 86 degrees F). Protect from moisture and light. Throw away any unused medicine after the expiration date. NOTE: This sheet is a summary. It may not cover all possible information. If you have questions about this medicine, talk to your doctor, pharmacist, or health care provider.  2022 Elsevier/Gold Standard (2018-11-08 00:00:00)

## 2021-10-12 NOTE — Progress Notes (Signed)
CMP WNL.  Hemoglobin is borderline low but has improved-11.5.  Rest of CBC WNL.

## 2021-10-17 MED ORDER — LEFLUNOMIDE 10 MG PO TABS: 10.0000 mg | ORAL_TABLET | Freq: Every day | ORAL | 2 refills | Status: DC

## 2021-10-17 NOTE — Telephone Encounter (Signed)
TB gold negative.  Hepatitis B and C negative.    ?IgM is slightly low.  ?Ok to send in arava.  ?

## 2021-10-17 NOTE — Progress Notes (Signed)
TB gold negative.  Hepatitis B and C negative.   IgM is slightly low.  Ok to send in arava.

## 2021-10-17 NOTE — Addendum Note (Signed)
Addended by: Carole Binning on: 10/17/2021 11:35 AM ? ? Modules accepted: Orders ? ?

## 2021-10-17 NOTE — Telephone Encounter (Signed)
Error message received when sending prescription electronically. I have called and provided a verbal prescription to the pharmacy.  ?

## 2021-10-18 LAB — PROTEIN ELECTROPHORESIS, SERUM, WITH REFLEX
Albumin ELP: 3.6 g/dL — ABNORMAL LOW (ref 3.8–4.8)
Alpha 1: 0.4 g/dL — ABNORMAL HIGH (ref 0.2–0.3)
Alpha 2: 0.7 g/dL (ref 0.5–0.9)
Beta 2: 0.4 g/dL (ref 0.2–0.5)
Beta Globulin: 0.4 g/dL (ref 0.4–0.6)
Gamma Globulin: 1.3 g/dL (ref 0.8–1.7)
Total Protein: 6.8 g/dL (ref 6.1–8.1)

## 2021-10-18 LAB — CBC WITH DIFFERENTIAL/PLATELET
Absolute Monocytes: 717 cells/uL (ref 200–950)
Basophils Absolute: 39 cells/uL (ref 0–200)
Basophils Relative: 0.7 %
Eosinophils Absolute: 398 cells/uL (ref 15–500)
Eosinophils Relative: 7.1 %
HCT: 35.6 % (ref 35.0–45.0)
Hemoglobin: 11.5 g/dL — ABNORMAL LOW (ref 11.7–15.5)
Lymphs Abs: 2246 cells/uL (ref 850–3900)
MCH: 29.9 pg (ref 27.0–33.0)
MCHC: 32.3 g/dL (ref 32.0–36.0)
MCV: 92.7 fL (ref 80.0–100.0)
MPV: 9.4 fL (ref 7.5–12.5)
Monocytes Relative: 12.8 %
Neutro Abs: 2201 cells/uL (ref 1500–7800)
Neutrophils Relative %: 39.3 %
Platelets: 343 10*3/uL (ref 140–400)
RBC: 3.84 10*6/uL (ref 3.80–5.10)
RDW: 14.6 % (ref 11.0–15.0)
Total Lymphocyte: 40.1 %
WBC: 5.6 10*3/uL (ref 3.8–10.8)

## 2021-10-18 LAB — COMPLETE METABOLIC PANEL WITH GFR
AG Ratio: 1.1 (calc) (ref 1.0–2.5)
ALT: 9 U/L (ref 6–29)
AST: 17 U/L (ref 10–35)
Albumin: 3.7 g/dL (ref 3.6–5.1)
Alkaline phosphatase (APISO): 91 U/L (ref 37–153)
BUN: 17 mg/dL (ref 7–25)
CO2: 24 mmol/L (ref 20–32)
Calcium: 10.1 mg/dL (ref 8.6–10.4)
Chloride: 102 mmol/L (ref 98–110)
Creat: 0.81 mg/dL (ref 0.60–1.00)
Globulin: 3.3 g/dL (calc) (ref 1.9–3.7)
Glucose, Bld: 77 mg/dL (ref 65–99)
Potassium: 3.8 mmol/L (ref 3.5–5.3)
Sodium: 139 mmol/L (ref 135–146)
Total Bilirubin: 0.3 mg/dL (ref 0.2–1.2)
Total Protein: 7 g/dL (ref 6.1–8.1)
eGFR: 74 mL/min/{1.73_m2} (ref >=60)

## 2021-10-18 LAB — QUANTIFERON-TB GOLD PLUS
Mitogen-NIL: >10 [IU]/mL
NIL: 0.05 [IU]/mL
QuantiFERON-TB Gold Plus: NEGATIVE
TB1-NIL: 0.02 [IU]/mL
TB2-NIL: <0 [IU]/mL

## 2021-10-18 LAB — HEPATITIS B SURFACE ANTIGEN: Hepatitis B Surface Ag: NONREACTIVE

## 2021-10-18 LAB — IGG, IGA, IGM
IgG (Immunoglobin G), Serum: 1500 mg/dL (ref 600–1540)
IgM, Serum: 31 mg/dL — ABNORMAL LOW (ref 50–300)
Immunoglobulin A: 202 mg/dL (ref 70–320)

## 2021-10-18 LAB — HEPATITIS C ANTIBODY
Hepatitis C Ab: NONREACTIVE
SIGNAL TO CUT-OFF: 0.09

## 2021-10-18 LAB — IFE INTERPRETATION: Immunofix Electr Int: NOT DETECTED

## 2021-10-18 LAB — HEPATITIS B CORE ANTIBODY, IGM: Hep B C IgM: NONREACTIVE

## 2021-10-18 NOTE — Progress Notes (Signed)
IFE did not reveal any monoclonal proteins.

## 2021-10-31 ENCOUNTER — Telehealth: Payer: Self-pay | Admitting: *Deleted

## 2021-10-31 NOTE — Telephone Encounter (Signed)
Labs received from:Dr. Marco Collie ? ?Drawn on:10/25/2021 ? ?Reviewed by:Hazel Sams, PA-C ? ?Labs drawn:CBC ? ?Results:Monocytes Absolute 1.2 ? ?Patient is on Arava 10 mg po daily and PLQ 200 mg po BID M-F  ?

## 2021-11-08 NOTE — Progress Notes (Signed)
? ?Office Visit Note ? ?Patient: Ann Gamble             ?Date of Birth: [birth year]           ?MRN: [id number]             ?PCP: Maryella Shivers, MD ?Referring: Maryella Shivers, MD ?Visit Date: 11/22/2021 ?Occupation: '@GUAROCC'$ @ ? ?Subjective:  ?Medication monitoring  ? ?History of Present Illness: Ann Gamble is a 80 y.o. female with history of seronegative rheumatoid arthritis and osteoarthritis.  She is currently taking arava 10 mg 1 tablet by mouth daily and plaquenil 200 mg 1 tablet by mouth twice daily Monday through Friday.  She was started on [place] after her last office visit on 10/11/2021.  She has been tolerating Arava without any side effects and has not missed any doses recently.  She has noticed very minimal improvement in her joint pain and swelling since starting on Arava.  She continues to have swelling and pain in the right wrist and hand.  She has difficulty using her right hand first thing in the morning.  She continues to have chronic pain in both knee replacements.  She denies any other joint pain or joint swelling at this time. ?She denies any recent infections. ? ? ?Activities of Daily Living:  ?Patient reports morning stiffness for all day. ?Patient Denies nocturnal pain.  ?Difficulty dressing/grooming: Reports ?Difficulty climbing stairs: Reports ?Difficulty getting out of chair: Denies ?Difficulty using hands for taps, buttons, cutlery, and/or writing: Reports ? ?Review of Systems  ?Constitutional:  Negative for fatigue.  ?HENT:  Negative for mouth sores, mouth dryness and nose dryness.   ?Eyes:  Negative for pain, itching and dryness.  ?Respiratory:  Negative for shortness of breath and difficulty breathing.   ?Cardiovascular:  Negative for chest pain and palpitations.  ?Gastrointestinal:  Negative for constipation and diarrhea.  ?Endocrine: Negative for increased urination.  ?Genitourinary:  Negative for difficulty urinating.  ?Musculoskeletal:  Positive for joint pain, joint  pain, joint swelling, myalgias, morning stiffness and myalgias. Negative for muscle tenderness.  ?Skin:  Negative for color change, rash and redness.  ?Allergic/Immunologic: Positive for susceptible to infections.  ?Neurological:  Positive for numbness and headaches. Negative for dizziness, memory loss and weakness.  ?Hematological:  Negative for bruising/bleeding tendency.  ?Psychiatric/Behavioral:  Negative for confusion.   ? ?PMFS History:  ?Patient Active Problem List  ? Diagnosis Date Noted  ? 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 Rheumatoid arthritis of multiple sites with negative rheumatoid factor ([place]) 09/04/2016  ? High risk medication use 09/04/2016  ? Primary osteoarthritis of right knee 09/04/2016  ? History of total knee replacement, left 09/04/2016  ? Primary osteoarthritis of both hands 09/04/2016  ? Primary osteoarthritis of both feet 09/04/2016  ? Essential hypertension 09/04/2016  ? Dyslipidemia 09/04/2016  ?  ?Past Medical History:  ?Diagnosis Date  ? Allergy   ? Anemia   ? Bilateral carpal tunnel syndrome   ? Cataract immature   ? right  ? Chronic gastritis   ? Constipation   ? takes stool softener nightly  ? Foot deformity, bilateral   ? GERD (gastroesophageal reflux disease)   ? Hx of colonic polyps   ? Hyperlipidemia   ? takes Simvastatin daily  ? Hypertension   ? takes Hyzaar and Amlodipine daily  ? IPMN (intraductal papillary mucinous neoplasm)   ? 1.2cm on CT 11-28-2016  ? OA (osteoarthritis)   ? Osteoporosis   ? RA (rheumatoid arthritis) ([place])   ?  Seasonal allergies   ? Sensitive skin   ? pt states she has highly sensitive skin  ? Urinary incontinence   ?  ?Family History  ?Problem Relation Age of Onset  ? Thyroid disease Mother   ? Alzheimer's disease Mother   ? Stroke Brother   ? Diabetes Son   ? Anesthesia problems Neg Hx   ? Hypotension Neg Hx   ? Pseudochol deficiency Neg Hx   ? Malignant hyperthermia Neg Hx   ? Colon cancer Neg Hx   ? Rectal cancer Neg Hx   ? Stomach cancer Neg Hx    ? ?Past Surgical History:  ?Procedure Laterality Date  ? CATARACT EXTRACTION Bilateral   ? COLONOSCOPY  11/20/2016  ? Mild sigmoid diverticulosis. Otherwise normal colonoscopy to terminal ileum  ? DENTAL SURGERY    ? ESOPHAGOGASTRODUODENOSCOPY  11/06/2016  ? Transient schatzki's ring status post esophageal dilatation. Small hiatal hernia. Mild gastritis  ? TONSILLECTOMY  1960  ? TOTAL KNEE ARTHROPLASTY  10/30/2011  ? Procedure: TOTAL KNEE ARTHROPLASTY;  Surgeon: Kerin Salen, MD;  Location: [place];  Service: Orthopedics;  Laterality: Left;  DEPUY/SIGMA  ? TOTAL KNEE ARTHROPLASTY Right 08/05/2018  ? Procedure: TOTAL KNEE ARTHROPLASTY;  Surgeon: Vickey Huger, MD;  Location: [facility];  Service: Orthopedics;  Laterality: Right;  ? VAGINAL HYSTERECTOMY  1970's  ? partial  ? ?Social History  ? ?Social History Narrative  ? Not on file  ? ?Immunization History  ?Administered Date(s) Administered  ? Moderna Sars-Covid-2 Vaccination 09/18/2019, 10/14/2019, 07/19/2020  ?  ? ?Objective: ?Vital Signs: BP 126/77 (BP Location: Left Arm, Patient Position: Sitting, Cuff Size: Normal)   Pulse 92   Ht '5\' 1"'$  (1.549 m)   Wt 153 lb 3.2 oz (69.5 kg)   BMI 28.95 kg/m?   ? ?Physical Exam ?Vitals and nursing note reviewed.  ?Constitutional:   ?   Appearance: She is well-developed.  ?HENT:  ?   Head: Normocephalic and atraumatic.  ?Eyes:  ?   Conjunctiva/sclera: Conjunctivae normal.  ?Cardiovascular:  ?   Rate and Rhythm: Normal rate and regular rhythm.  ?   Heart sounds: Normal heart sounds.  ?Pulmonary:  ?   Effort: Pulmonary effort is normal.  ?   Breath sounds: Normal breath sounds.  ?Abdominal:  ?   General: Bowel sounds are normal.  ?   Palpations: Abdomen is soft.  ?Musculoskeletal:  ?   Cervical back: Normal range of motion.  ?Skin: ?   General: Skin is warm and dry.  ?   Capillary Refill: Capillary refill takes less than 2 seconds.  ?Neurological:  ?   Mental Status: She is alert and oriented to person, place, and time.   ?Psychiatric:     ?   Behavior: Behavior normal.  ?  ? ?Musculoskeletal Exam: C-spine has good range of motion.  Postural thoracic kyphosis noted.  No midline spinal tenderness.  Shoulder joints and elbow joints have good range of motion.  Extensor tenosynovitis of the right wrist.  Tenderness and synovitis over the ulnar aspect of the right wrist.  No tenderness or synovitis over MCP joints.  PIP and DIP thickening consistent with osteoarthritis of both hands.  Incomplete fist formation with subluxation of several PIP and DIP joints due to severe osteoarthritis.  Hip joints have good range of motion with no groin pain.  Both knee replacements have good range of motion with discomfort in the right knee.  Ankle joints have good range of motion with no  tenderness or joint swelling. ? ?CDAI Exam: ?CDAI Score: 2.8  ?Patient Global: 4 mm; Provider Global: 4 mm ?Swollen: 1 ; Tender: 1  ?Joint Exam 11/22/2021  ? ?   Right  Left  ?Wrist  Swollen Tender     ? ? ? ?Investigation: ?No additional findings. ? ?Imaging: ?No results found. ? ?Recent Labs: ?Lab Results  ?Component Value Date  ? WBC 5.2 11/17/2021  ? HGB 12.6 11/17/2021  ? PLT 263 11/17/2021  ? NA 139 11/17/2021  ? K 3.5 11/17/2021  ? CL 102 11/17/2021  ? CO2 23 11/17/2021  ? GLUCOSE 100 (H) 11/17/2021  ? BUN 9 11/17/2021  ? CREATININE 0.67 11/17/2021  ? BILITOT 0.3 11/17/2021  ? ALKPHOS 111 11/17/2021  ? AST 19 11/17/2021  ? ALT 10 11/17/2021  ? PROT 6.5 11/17/2021  ? ALBUMIN 4.0 11/17/2021  ? CALCIUM 9.9 11/17/2021  ? GFRAA 79 09/15/2020  ? QFTBGOLDPLUS NEGATIVE 10/11/2021  ? ? ?Speciality Comments: PLQ Eye Exam: 10/15/2020 WNL @ [facility] Follow up in  12 months ? ? ?Procedures:  ?No procedures performed ?Allergies: Lactose intolerance (gi); Antihistamines, chlorpheniramine-type; Other; and Tape  ? 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  She continues to have significant stiffness and pain in her right hand and wrist on a daily basis especially first thing in the morning.  She is currently taking

## 2021-11-11 ENCOUNTER — Telehealth: Payer: Self-pay | Admitting: Rheumatology

## 2021-11-11 DIAGNOSIS — Z79899 Other long term (current) drug therapy: Secondary | ICD-10-CM

## 2021-11-11 NOTE — Telephone Encounter (Signed)
Patient called the office requesting lab orders be sent to [facility] in [place]. Patient has an appointment there on April 6. ?

## 2021-11-11 NOTE — Telephone Encounter (Signed)
Lab Orders released.  

## 2021-11-17 ENCOUNTER — Other Ambulatory Visit: Payer: Self-pay | Admitting: Physician Assistant

## 2021-11-18 LAB — SPECIMEN STATUS REPORT

## 2021-11-19 ENCOUNTER — Other Ambulatory Visit: Payer: Self-pay | Admitting: Physician Assistant

## 2021-11-19 DIAGNOSIS — M0609 Rheumatoid arthritis without rheumatoid factor, multiple sites: Secondary | ICD-10-CM

## 2021-11-19 LAB — COMPREHENSIVE METABOLIC PANEL
ALT: 10 IU/L (ref 0–32)
AST: 19 IU/L (ref 0–40)
Albumin/Globulin Ratio: 1.6 (ref 1.2–2.2)
Albumin: 4 g/dL (ref 3.7–4.7)
Alkaline Phosphatase: 111 IU/L (ref 44–121)
BUN/Creatinine Ratio: 13 (ref 12–28)
BUN: 9 mg/dL (ref 8–27)
Bilirubin Total: 0.3 mg/dL (ref 0.0–1.2)
CO2: 23 mmol/L (ref 20–29)
Calcium: 9.9 mg/dL (ref 8.7–10.3)
Chloride: 102 mmol/L (ref 96–106)
Creatinine, Ser: 0.67 mg/dL (ref 0.57–1.00)
Globulin, Total: 2.5 g/dL (ref 1.5–4.5)
Glucose: 100 mg/dL — ABNORMAL HIGH (ref 70–99)
Potassium: 3.5 mmol/L (ref 3.5–5.2)
Sodium: 139 mmol/L (ref 134–144)
Total Protein: 6.5 g/dL (ref 6.0–8.5)
eGFR: 89 mL/min/{1.73_m2} (ref >59)

## 2021-11-19 LAB — CBC WITH DIFFERENTIAL/PLATELET
Basophils Absolute: 0.1 10*3/uL (ref 0.0–0.2)
Basos: 1 %
EOS (ABSOLUTE): 0.3 10*3/uL (ref 0.0–0.4)
Eos: 5 %
Hematocrit: 36.8 % (ref 34.0–46.6)
Hemoglobin: 12.6 g/dL (ref 11.1–15.9)
Immature Grans (Abs): 0 10*3/uL (ref 0.0–0.1)
Immature Granulocytes: 0 %
Lymphocytes Absolute: 1.3 10*3/uL (ref 0.7–3.1)
Lymphs: 25 %
MCH: 31.1 pg (ref 26.6–33.0)
MCHC: 34.2 g/dL (ref 31.5–35.7)
MCV: 91 fL (ref 79–97)
Monocytes Absolute: 0.8 10*3/uL (ref 0.1–0.9)
Monocytes: 16 %
Neutrophils Absolute: 2.7 10*3/uL (ref 1.4–7.0)
Neutrophils: 53 %
Platelets: 263 10*3/uL (ref 150–450)
RBC: 4.05 x10E6/uL (ref 3.77–5.28)
RDW: 13.3 % (ref 11.7–15.4)
WBC: 5.2 10*3/uL (ref 3.4–10.8)

## 2021-11-21 NOTE — Progress Notes (Signed)
CBC and CMP WNL

## 2021-11-21 NOTE — Telephone Encounter (Signed)
Next Visit: 11/22/2021 ? ?Last Visit: 10/11/2021 ? ?Labs: 10/11/2021 CMP WNL.  ?Hemoglobin is borderline low but has improved-11.5.  Rest of CBC WNL.   ? ?Eye exam: 10/15/2020  ? ?Current Dose per office note on 10/11/2021: Plaquenil 200 mg 1 tablet by mouth twice daily Monday through Friday only. ? ?OH:KGOVPCHEKB arthritis of multiple sites with negative rheumatoid factor ? ?Last Fill: 08/30/2021 ? ?Will check status of plaquenil eye exam at patient's appointment tomorrow.  ? ?Okay to refill Plaquenil?  ?

## 2021-11-22 ENCOUNTER — Ambulatory Visit (INDEPENDENT_AMBULATORY_CARE_PROVIDER_SITE_OTHER): Payer: Medicare Other | Admitting: Physician Assistant

## 2021-11-22 ENCOUNTER — Encounter: Payer: Self-pay | Admitting: Physician Assistant

## 2021-11-22 VITALS — BP 126/77 | HR 92 | Ht 61.0 in | Wt 153.2 lb

## 2021-11-22 DIAGNOSIS — Z8679 Personal history of other diseases of the circulatory system: Secondary | ICD-10-CM

## 2021-11-22 DIAGNOSIS — M19071 Primary osteoarthritis, right ankle and foot: Secondary | ICD-10-CM

## 2021-11-22 DIAGNOSIS — M65831 Other synovitis and tenosynovitis, right forearm: Secondary | ICD-10-CM

## 2021-11-22 DIAGNOSIS — Z79899 Other long term (current) drug therapy: Secondary | ICD-10-CM

## 2021-11-22 DIAGNOSIS — M19072 Primary osteoarthritis, left ankle and foot: Secondary | ICD-10-CM

## 2021-11-22 DIAGNOSIS — Z8639 Personal history of other endocrine, nutritional and metabolic disease: Secondary | ICD-10-CM

## 2021-11-22 DIAGNOSIS — Z96651 Presence of right artificial knee joint: Secondary | ICD-10-CM

## 2021-11-22 DIAGNOSIS — M19042 Primary osteoarthritis, left hand: Secondary | ICD-10-CM

## 2021-11-22 DIAGNOSIS — M0609 Rheumatoid arthritis without rheumatoid factor, multiple sites: Secondary | ICD-10-CM | POA: Diagnosis not present

## 2021-11-22 DIAGNOSIS — Z96652 Presence of left artificial knee joint: Secondary | ICD-10-CM

## 2021-11-22 DIAGNOSIS — M19041 Primary osteoarthritis, right hand: Secondary | ICD-10-CM

## 2021-12-01 ENCOUNTER — Telehealth: Payer: Self-pay | Admitting: Rheumatology

## 2021-12-01 ENCOUNTER — Other Ambulatory Visit: Payer: Self-pay | Admitting: *Deleted

## 2021-12-01 DIAGNOSIS — Z79899 Other long term (current) drug therapy: Secondary | ICD-10-CM

## 2021-12-01 NOTE — Telephone Encounter (Signed)
Patient called the office requesting lab orders be sent to [facility] in [place]. Patient stating she has an appointment on 4/27. ?

## 2021-12-01 NOTE — Telephone Encounter (Signed)
Lab Orders released.  

## 2021-12-09 LAB — CBC WITH DIFFERENTIAL/PLATELET
Basophils Absolute: 0 10*3/uL (ref 0.0–0.2)
Basos: 1 %
EOS (ABSOLUTE): 0.2 10*3/uL (ref 0.0–0.4)
Eos: 5 %
Hematocrit: 37.2 % (ref 34.0–46.6)
Hemoglobin: 12.9 g/dL (ref 11.1–15.9)
Immature Grans (Abs): 0 10*3/uL (ref 0.0–0.1)
Immature Granulocytes: 0 %
Lymphocytes Absolute: 1.2 10*3/uL (ref 0.7–3.1)
Lymphs: 24 %
MCH: 31.4 pg (ref 26.6–33.0)
MCHC: 34.7 g/dL (ref 31.5–35.7)
MCV: 91 fL (ref 79–97)
Monocytes Absolute: 0.7 10*3/uL (ref 0.1–0.9)
Monocytes: 15 %
Neutrophils Absolute: 2.7 10*3/uL (ref 1.4–7.0)
Neutrophils: 55 %
Platelets: 254 10*3/uL (ref 150–450)
RBC: 4.11 x10E6/uL (ref 3.77–5.28)
RDW: 12.7 % (ref 11.7–15.4)
WBC: 4.8 10*3/uL (ref 3.4–10.8)

## 2021-12-09 LAB — CMP14+EGFR
ALT: 13 IU/L (ref 0–32)
AST: 21 IU/L (ref 0–40)
Albumin/Globulin Ratio: 1.7 (ref 1.2–2.2)
Albumin: 4.2 g/dL (ref 3.7–4.7)
Alkaline Phosphatase: 114 IU/L (ref 44–121)
BUN/Creatinine Ratio: 15 (ref 12–28)
BUN: 10 mg/dL (ref 8–27)
Bilirubin Total: 0.3 mg/dL (ref 0.0–1.2)
CO2: 25 mmol/L (ref 20–29)
Calcium: 10 mg/dL (ref 8.7–10.3)
Chloride: 102 mmol/L (ref 96–106)
Creatinine, Ser: 0.65 mg/dL (ref 0.57–1.00)
Globulin, Total: 2.5 g/dL (ref 1.5–4.5)
Glucose: 96 mg/dL (ref 70–99)
Potassium: 3.6 mmol/L (ref 3.5–5.2)
Sodium: 139 mmol/L (ref 134–144)
Total Protein: 6.7 g/dL (ref 6.0–8.5)
eGFR: 90 mL/min/{1.73_m2} (ref >59)

## 2021-12-09 NOTE — Progress Notes (Signed)
CBC and CMP are normal.

## 2021-12-19 ENCOUNTER — Other Ambulatory Visit: Payer: Self-pay | Admitting: Rheumatology

## 2021-12-19 ENCOUNTER — Telehealth: Payer: Self-pay | Admitting: Rheumatology

## 2021-12-19 MED ORDER — LEFLUNOMIDE 20 MG PO TABS: 20.0000 mg | ORAL_TABLET | Freq: Every day | ORAL | 0 refills | Status: DC

## 2021-12-19 NOTE — Telephone Encounter (Signed)
Patient called the office stating she is about to finish the 2nd bottle of Leflunomide '10mg'$  but she cannot find 3rd bottle. Patient states its not where it should be and she was given 3 bottles when she started. Patient requests a refill be sent in for just one bottle. Patient requests a call back if that can be sent in. ?

## 2021-12-19 NOTE — Telephone Encounter (Signed)
Next Visit: 01/25/2022 ? ?Last Visit: 11/22/2021 ? 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 ?Current Dose per office note 11/22/2021: arava 20 mg daily ? ?Labs: 12/08/2021 CBC and CMP are normal. ? ?Okay to refill Arava?  ?

## 2021-12-19 NOTE — Telephone Encounter (Signed)
Patient called the office requesting lab orders be sent to [facility] in [place]. Patient states she is going on 5/17. ?

## 2021-12-19 NOTE — Telephone Encounter (Signed)
Patient advised she is not due for lab work. Patient advised she will be due again for lab work at the end of July. Patient expressed understanding.  ?

## 2022-01-11 NOTE — Progress Notes (Signed)
Office Visit Note  Patient: Ann Gamble             Date of Birth: 1941/03/27           MRN: [id number]             PCP: Maryella Shivers, MD Referring: Maryella Shivers, MD Visit Date: 01/25/2022 Occupation: '@GUAROCC'$ @  Subjective:  Right wrist pain and swelling   History of Present Illness: TAHRA Ann Gamble is a 81 y.o. female with history of seronegative rheumatoid arthritis and osteoarthritis.  Patient is currently taking Arava 20 mg 1 tablet by mouth daily and Plaquenil 200 mg twice daily Monday through Friday.  The patient was advised to increase the dose of Arava after her last office visit on 11/22/2021.  She has been tolerating Arava without any side effects.  She has not noticed any improvement in her right wrist joint pain and swelling.  She has had difficulty performing ADLs due to severity of pain and inflammation.  She would like to discuss other treatment options.  Activities of Daily Living:  Patient reports joint stiffness all day Patient Reports nocturnal pain.  Difficulty dressing/grooming: Reports Difficulty climbing stairs: Reports Difficulty getting out of chair: Denies Difficulty using hands for taps, buttons, cutlery, and/or writing: Reports  Review of Systems  Constitutional:  Positive for fatigue.  HENT:  Positive for mouth dryness. Negative for mouth sores and nose dryness.   Eyes:  Negative for pain, visual disturbance and dryness.  Respiratory:  Negative for cough, hemoptysis and difficulty breathing.   Cardiovascular:  Positive for swelling in legs/feet. Negative for chest pain, palpitations and hypertension.  Gastrointestinal:  Negative for blood in stool, constipation and diarrhea.  Endocrine: Negative for increased urination.  Genitourinary:  Negative for difficulty urinating and painful urination.  Musculoskeletal:  Positive for joint pain, gait problem, joint pain, joint swelling, muscle weakness and morning stiffness. Negative for myalgias,  muscle tenderness and myalgias.  Skin:  Positive for rash. Negative for color change, pallor, hair loss, skin tightness, ulcers and sensitivity to sunlight.  Allergic/Immunologic: Negative for susceptible to infections.  Neurological:  Negative for dizziness and headaches.  Hematological:  Negative for bruising/bleeding tendency and swollen glands.  Psychiatric/Behavioral:  Positive for sleep disturbance. Negative for depressed mood. The patient is not nervous/anxious.     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 factor ([place]) 09/04/2016   High risk medication use 09/04/2016   Primary osteoarthritis of right knee 09/04/2016   History of total knee replacement, left 09/04/2016   Primary osteoarthritis of both hands 09/04/2016   Primary osteoarthritis of both feet 09/04/2016   Essential hypertension 09/04/2016   Dyslipidemia 09/04/2016    Past Medical History:  Diagnosis Date   Allergy    Anemia    Bilateral carpal tunnel syndrome    Cataract immature    right   Chronic gastritis    Constipation    takes stool softener nightly   Foot deformity, bilateral    GERD (gastroesophageal reflux disease)    Hx of colonic polyps    Hyperlipidemia    takes Simvastatin daily   Hypertension    takes Hyzaar and Amlodipine daily   IPMN (intraductal papillary mucinous neoplasm)    1.2cm on CT 11-28-2016   OA (osteoarthritis)    Osteoporosis    RA (rheumatoid arthritis) ([place])    Seasonal  allergies    Sensitive skin    pt states she has highly sensitive skin   Urinary incontinence     Family History  Problem Relation Age of Onset   Thyroid disease Mother    Alzheimer's disease Mother    Stroke Brother    Diabetes Son    Anesthesia problems Neg Hx    Hypotension Neg Hx    Pseudochol deficiency Neg Hx    Malignant hyperthermia Neg Hx    Colon cancer Neg  Hx    Rectal cancer Neg Hx    Stomach cancer Neg Hx    Past Surgical History:  Procedure Laterality Date   CATARACT EXTRACTION Bilateral    COLONOSCOPY  11/20/2016   Mild sigmoid diverticulosis. Otherwise normal colonoscopy to terminal ileum   DENTAL SURGERY     ESOPHAGOGASTRODUODENOSCOPY  11/06/2016   Transient schatzki's ring status post esophageal dilatation. Small hiatal hernia. 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   Procedure: TOTAL KNEE ARTHROPLASTY;  Surgeon: Kerin Salen, MD;  Location: [facility];  Service: Orthopedics;  Laterality: Left;  DEPUY/SIGMA   TOTAL KNEE ARTHROPLASTY Right 08/05/2018   Procedure: TOTAL KNEE ARTHROPLASTY;  Surgeon: Vickey Huger, MD;  Location: [facility];  Service: Orthopedics;  Laterality: Right;   VAGINAL HYSTERECTOMY  1970's   partial   Social History   Social History Narrative   Not on file   Immunization History  Administered Date(s) Administered   Moderna Sars-Covid-2 Vaccination 09/18/2019, 10/14/2019, 07/19/2020     Objective: Vital Signs: BP 116/73 (BP Location: Left Arm, Patient Position: Sitting, Cuff Size: Normal)   Pulse 98   Resp 16   Ht '5\' 1"'$  (1.549 m)   Wt 149 lb (67.6 kg)   BMI 28.15 kg/m    Physical Exam Vitals and nursing note reviewed.  Constitutional:      Appearance: She is well-developed.  HENT:     Head: Normocephalic and atraumatic.  Eyes:     Conjunctiva/sclera: Conjunctivae normal.  Cardiovascular:     Rate and Rhythm: Normal rate and regular rhythm.     Heart sounds: Normal heart sounds.  Pulmonary:     Effort: Pulmonary effort is normal.     Breath sounds: Normal breath sounds.  Abdominal:     General: Bowel sounds are normal.     Palpations: Abdomen is soft.  Musculoskeletal:     Cervical back: Normal range of motion.  Skin:    General: Skin is warm and dry.     Capillary Refill: Capillary refill takes less than 2 seconds.  Neurological:     Mental Status: She  is alert and oriented to person, place, and time.  Psychiatric:        Behavior: Behavior normal.      Musculoskeletal Exam: C-spine has limited range of motion with lateral rotation.  Thoracic kyphosis noted.  No midline spinal tenderness.  Shoulder joints and elbow joints have good range of motion.  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  Tenderness and synovitis on the ulnar aspect of her right wrist joint.  No tenderness or synovitis over MCP joints.  PIP and DIP thickening consistent with osteoarthritis of both hands.  Incomplete fist formation due to subluxation of several PIP and DIP joints.  Hip joints have good range of motion with no groin pain.  Both knee replacements have good range of motion with some discomfort in her right knee.  Ankle joints have good range  of motion with no tenderness or synovitis.  CDAI Exam: CDAI Score: 3.2  Patient Global: 6 mm; Provider Global: 6 mm Swollen: 1 ; Tender: 1  Joint Exam 01/25/2022      Right  Left  Wrist  Swollen Tender        Investigation: No additional findings.  Imaging: No results found.  Recent Labs: Lab Results  Component Value Date   WBC 4.8 12/08/2021   HGB 12.9 12/08/2021   PLT 254 12/08/2021   NA 139 12/08/2021   K 3.6 12/08/2021   CL 102 12/08/2021   CO2 25 12/08/2021   GLUCOSE 96 12/08/2021   BUN 10 12/08/2021   CREATININE 0.65 12/08/2021   BILITOT 0.3 12/08/2021   ALKPHOS 114 12/08/2021   AST 21 12/08/2021   ALT 13 12/08/2021   PROT 6.7 12/08/2021   ALBUMIN 4.2 12/08/2021   CALCIUM 10.0 12/08/2021   GFRAA 79 09/15/2020   QFTBGOLDPLUS NEGATIVE 10/11/2021    Speciality Comments: PLQ Eye Exam: 10/15/2020 WNL @ [facility] Follow up in  12 months   Procedures:  No procedures performed Allergies: Lactose intolerance (gi); Antihistamines, chlorpheniramine-type; Other; and Tape   Assessment / Plan:     Visit Diagnoses: 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  She is currently taking Plaquenil 200 mg 1 tablet by mouth twice daily Monday through Friday and Arava 20 mg 1 tablet daily.  She was initially started on low-dose Arava after her office visit on 10/11/2021.  The dose of Arava was increased after her last office visit on 11/22/2021.  She has been tolerating Arava without any side effects or recurrent infections.  Discussed that she will require more aggressive treatment to manage the extensor tenosynovitis.  X-rays of both hands and feet were obtained today to assess for radiographic progression.  Different treatment options were discussed today in detail.  Indications, contraindications, potential side effects of Enbrel were discussed.  All questions were addressed and consent was obtained today.  We will apply for Enbrel through her insurance and once approved she will return to the office for administration of the first injection.  She can discontinue Arava once she has initiated Enbrel. She will follow-up in the office in 6 to 8 weeks to assess her response. - Plan: XR Hand 2 View Left, XR Hand 2 View Right, XR Foot 2 Views Left, XR Foot 2 Views Right  Medication counseling:   TB Test: Negative on 10/11/2021 Hepatitis panel: Hepatitis B and C- on 10/11/2021. SPEP: No monoclonal proteins detected on 10/11/2021. Immunoglobulins: 10/11/2021  Chest x-ray: Negative exam on 10/23/11  Does patient have diagnosis of heart failure?  No  Counseled patient that Enbrel is a TNF blocking agent.  Reviewed Enbrel dose of 50 mg once weekly.  Counseled patient on purpose, proper use, and adverse effects of Enbrel.  Reviewed the most common adverse effects including infections, headache, and injection site reactions. Discussed that there is the possibility of an increased risk of malignancy but it is not well understood if this increased risk is due to the medication  or the disease state.  Advised patient to get yearly dermatology exams due to risk of skin cancer.  Reviewed the importance of regular labs while on Enbrel therapy.  Advised patient to get standing labs one month after starting Enbrel then every 2 months.  Provided patient with standing lab orders.  Counseled patient that Enbrel should be held prior to scheduled surgery.  Counseled patient to avoid live vaccines while on Enbrel.  Advised patient to get annual influenza vaccine and the pneumococcal vaccine as needed.  Provided patient with medication education material and answered all questions.  Patient voiced understanding.  Patient consented to Enbrel.  Will upload consent into the media tab.  Reviewed storage instructions for Enbrel.  Advised initial injection must be administered in office.  Patient voiced understanding.    High risk medication use -Applying for Enbrel 50 mg subcutaneous injections every week.  She will remain on Plaquenil 200 mg 1 tablet by mouth twice daily Monday through Friday only and arava 20 mg daily.  She can discontinue Arava once she has initiated Enbrel.   PLQ Eye Exam: 10/15/2020 WNL @ [facility].  Patient was advised to have an updated Plaquenil eye examination. CBC and CMP within normal limits on 12/08/2021.  Orders for CBC and CMP were released today.  Her next lab work will be due in 1 month and every 3 months after initiating Enbrel.  TB Gold negative on 10/11/2021.  Baseline immunosuppressive labs were obtained on 10/11/2021.- Plan: CBC with Differential/Platelet, COMPLETE METABOLIC PANEL WITH GFR Discussed the importance of holding enbrel if she develops signs or symptoms of an infection and to resume once the infection is completely cleared. Discussed the importance of having yearly skin examinations while on Enbrel.  She plans on having her PCP perform these exams.  Extensor tenosynovitis of right wrist - She has severe extensor tenosynovitis as well as synovitis on  the ulnar aspect of her right wrist.  The patient is currently taking Plaquenil and Arava as combination therapy.  The dose of Arava was increased to 20 mg after her last office visit on 11/22/2021.  She has not noticed any improvement in the pain, stiffness, or range of motion of her right wrist.  Different treatment options were discussed today.  The plan is to initiate Enbrel once approved by her insurance.  Primary osteoarthritis of both hands: She has PIP and DIP thickening consistent with osteoarthritis of both hands.  Subluxation of several PIP and DIP joints as well as incomplete fist formation noted.  Discussed the importance of joint protection and muscle strengthening.  History of total right knee replacement: Chronic pain.  Good range of motion with no warmth or effusion.  History of total knee replacement, left: Doing well.  She has good range of motion with no warmth or effusion.  Primary osteoarthritis of both feet: She has not been experiencing any increased discomfort in her feet at this time.  She has good range of motion of both ankle joints with no tenderness or synovitis.  Other medical conditions are listed as follows:  History of hypertension: Blood pressure was 116/73 today in the office.  History of hyperlipidemia  Orders: Orders Placed This Encounter  Procedures   XR Hand 2 View Left   XR Hand 2 View Right   XR Foot 2 Views Left   XR Foot 2 Views Right   CBC with Differential/Platelet   COMPLETE METABOLIC PANEL WITH GFR   No orders of the defined types were placed in this encounter.   Follow-Up Instructions: Return in 8 weeks (on 03/22/2022) for Rheumatoid arthritis.   Ofilia Neas, PA-C  Note - This record has been created using Dragon software.  Chart creation errors have been sought, but may not always  have been located. Such creation errors  do not reflect on  the standard of medical care.

## 2022-01-23 ENCOUNTER — Ambulatory Visit: Payer: Medicare Other | Admitting: Physician Assistant

## 2022-01-25 ENCOUNTER — Ambulatory Visit (INDEPENDENT_AMBULATORY_CARE_PROVIDER_SITE_OTHER): Payer: Medicare Other | Admitting: Physician Assistant

## 2022-01-25 ENCOUNTER — Ambulatory Visit (INDEPENDENT_AMBULATORY_CARE_PROVIDER_SITE_OTHER): Payer: Medicare Other

## 2022-01-25 ENCOUNTER — Encounter: Payer: Self-pay | Admitting: Physician Assistant

## 2022-01-25 VITALS — BP 116/73 | HR 98 | Resp 16 | Ht 61.0 in | Wt 149.0 lb

## 2022-01-25 DIAGNOSIS — Z96652 Presence of left artificial knee joint: Secondary | ICD-10-CM

## 2022-01-25 DIAGNOSIS — M0609 Rheumatoid arthritis without rheumatoid factor, multiple sites: Secondary | ICD-10-CM

## 2022-01-25 DIAGNOSIS — M65831 Other synovitis and tenosynovitis, right forearm: Secondary | ICD-10-CM | POA: Diagnosis not present

## 2022-01-25 DIAGNOSIS — Z96651 Presence of right artificial knee joint: Secondary | ICD-10-CM

## 2022-01-25 DIAGNOSIS — Z79899 Other long term (current) drug therapy: Secondary | ICD-10-CM

## 2022-01-25 DIAGNOSIS — M19072 Primary osteoarthritis, left ankle and foot: Secondary | ICD-10-CM

## 2022-01-25 DIAGNOSIS — Z8679 Personal history of other diseases of the circulatory system: Secondary | ICD-10-CM

## 2022-01-25 DIAGNOSIS — M65931 Unspecified synovitis and tenosynovitis, right forearm: Secondary | ICD-10-CM

## 2022-01-25 DIAGNOSIS — M19041 Primary osteoarthritis, right hand: Secondary | ICD-10-CM

## 2022-01-25 DIAGNOSIS — Z8639 Personal history of other endocrine, nutritional and metabolic disease: Secondary | ICD-10-CM

## 2022-01-25 DIAGNOSIS — M19042 Primary osteoarthritis, left hand: Secondary | ICD-10-CM

## 2022-01-25 DIAGNOSIS — M19071 Primary osteoarthritis, right ankle and foot: Secondary | ICD-10-CM

## 2022-01-25 NOTE — Patient Instructions (Signed)
Standing Labs We placed an order today for your standing lab work.   Please have your standing labs drawn in 1 month then every 3 months   If possible, please have your labs drawn 2 weeks prior to your appointment so that the provider can discuss your results at your appointment.  Please note that you may see your imaging and lab results in [place] before we have reviewed them. We may be awaiting multiple results to interpret others before contacting you. Please allow our office up to 72 hours to thoroughly review all of the results before contacting the office for clarification of your results.  We have open lab daily: Monday through Thursday from 1:30-4:30 PM and Friday from 1:30-4:00 PM at the office of Dr. Bo Merino, [facility].   Please be advised, all patients with office appointments requiring lab work will take precedent over walk-in lab work.  If possible, please come for your lab work on Monday and Friday afternoons, as you may experience shorter wait times. The office is located at [address] [place], [place], [place] [address] No appointment is necessary.   Labs are drawn by [facility]. Please bring your co-pay at the time of your lab draw.  You may receive a bill from [facility] for your lab work.  Please note if you are on Hydroxychloroquine and and an order has been placed for a Hydroxychloroquine level, you will need to have it drawn 4 hours or more after your last dose.  If you wish to have your labs drawn at another location, please call the office 24 hours in advance to send orders.  If you have any questions regarding directions or hours of operation,  please call [phone].   As a reminder, please drink plenty of water prior to coming for your lab work. Thanks!   Etanercept Injection What is this medication? ETANERCEPT (et a [employer]) treats autoimmune conditions, such as psoriasis and certain types of arthritis. It works by slowing down an  overactive immune system. It belongs to a group of medications called TNF inhibitors. This medicine may be used for other purposes; ask your health care provider or pharmacist if you have questions. COMMON BRAND NAME(S): Enbrel What should I tell my care team before I take this medication? They need to know if you have any of these conditions: Bleeding disorder Cancer Diabetes Granulomatosis with polyangiitis Heart failure HIV or AIDS Immune system problems Infection such as tuberculosis (TB) or other bacterial, fungal or viral infections Liver disease Nervous system problems such as Guillain-Barre syndrome, multiple sclerosis or seizures Recent or upcoming vaccine An unusual or allergic reaction to etanercept, other medications, latex, rubber, food, dyes, or preservatives Pregnant or trying to get pregnant Breast-feeding How should I use this medication? The medication is injected under the skin. You will be taught how to prepare and give it. Take it as directed on the prescription label. Keep taking it unless your care team tells you stop. This medication comes with INSTRUCTIONS FOR USE. Ask your pharmacist for directions on how to use this medication. Read the information carefully. Talk to your pharmacist or care team if you have questions. If you use a pen, be sure to take off the outer needle cover before using the dose. It is important that you put your used needles and syringes in a special sharps container. Do not put them in a trash can. If you do not have a sharps container, call your pharmacist or care team  to get one. A special MedGuide will be given to you by the pharmacist with each prescription and refill. Be sure to read this information carefully each time. Talk to your care team about the use of this medication in children. While it may be prescribed for children as young as 44 years of age for selected conditions, precautions do apply. Overdosage: If you think you have  taken too much of this medicine contact a poison control center or emergency room at once. NOTE: This medicine is only for you. Do not share this medicine with others. What if I miss a dose? If you miss a dose, take it as soon as you can. If it is almost time for your next dose, take only that dose. Do not take double or extra doses. What may interact with this medication? Do not take this medication with any of the following: Biologic medications such as adalimumab, certolizumab, golimumab, infliximab Live vaccines Rilonacept This medication may also interact with the following: Abatacept Anakinra Biologic medications such as anifrolumab, baricitinib, belimumab, canakinumab, natalizumab, rituximab, sarilumab, tocilizumab, tofacitinib, upadacitinib, vedolizumab Cyclophosphamide Sulfasalazine This list may not describe all possible interactions. Give your health care provider a list of all the medicines, herbs, non-prescription drugs, or dietary supplements you use. Also tell them if you smoke, drink alcohol, or use illegal drugs. Some items may interact with your medicine. What should I watch for while using this medication? Visit your care team for regular checks on your progress. Tell your care team if your symptoms do not start to get better or if they get worse. This medication may increase your risk of getting an infection. Call your care team for advice if you get a fever, chills, sore throat, or other symptoms of a cold or flu. Do not treat yourself. Try to avoid being around people who are sick. If you have not had the measles or chickenpox vaccines, tell your care team right away if you are around someone with these viruses. You will be tested for tuberculosis (TB) before you start this medication. If your care team prescribes any medication for TB, you should start taking the TB medication before starting this medication. Make sure to finish the full course of TB medication. Avoid  taking medications that contain aspirin, acetaminophen, ibuprofen, naproxen, or ketoprofen unless instructed by your care team. These medications may hide fever. Talk to your care team about your risk of cancer. You may be more at risk for certain types of cancer if you take this medication. This medication can decrease the response to a vaccine. If you need to get vaccinated, tell your care team if you have received this medication. Extra booster doses may be needed. Talk to your care team to see if a different vaccination schedule is needed. What side effects may I notice from receiving this medication? Side effects that you should report to your care team as soon as possible: Allergic reactions--skin rash, itching, hives, swelling of the face, lips, tongue, or throat Body pain, tingling, or numbness Eye pain, change in vision, vision loss Heart failure--shortness of breath, swelling of the ankles, feet, or hands, sudden weight gain, unusual weakness or fatigue Infection--fever, chills, cough, sore throat, wounds that don't heal, pain or trouble when passing urine, general feeling of discomfort or being unwell Liver injury--right upper belly pain, loss of appetite, nausea, light-colored stool, dark yellow or brown urine, yellowing skin or eyes, unusual weakness or fatigue Low red blood cell level--unusual weakness or fatigue, dizziness,  headache, trouble breathing Lupus-like syndrome--joint pain, swelling, or stiffness, butterfly-shaped rash on the face, rashes that get worse in the sun, fever, unusual weakness or fatigue New or worsening psoriasis--rash with itchy, scaly patches Seizures Unusual bruising or bleeding Weakness in arms and legs Side effects that usually do not require medical attention (report to your care team if they continue or are bothersome): Headache Pain, redness, or irritation at injection site Sinus pain or pressure around the face or forehead This list may not describe  all possible side effects. Call your doctor for medical advice about side effects. You may report side effects to FDA at 1-800-FDA-1088. Where should I keep my medication? Keep out of the reach of children and pets. See product for storage information. Each product may have different instructions. Get rid of any unused medication after the expiration date. To get rid of medications that are no longer needed or have expired: Take the medication to a medication take-back program. Check with your pharmacy or law enforcement to find a location. If you cannot return the medication, ask your pharmacist or care team how to get rid of this medication safely. NOTE: This sheet is a summary. It may not cover all possible information. If you have questions about this medicine, talk to your doctor, pharmacist, or health care provider.  2023 Elsevier/Gold Standard (2021-03-24 00:00:00)

## 2022-01-25 NOTE — Progress Notes (Signed)
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   Repeat x-rays in 2-3 years to assess for radiographic progression is recommended.   Please notify the patient.

## 2022-01-26 LAB — CBC WITH DIFFERENTIAL/PLATELET
Absolute Monocytes: 920 cells/uL (ref 200–950)
Basophils Absolute: 69 cells/uL (ref 0–200)
Basophils Relative: 1.5 %
Eosinophils Absolute: 285 cells/uL (ref 15–500)
Eosinophils Relative: 6.2 %
HCT: 38 % (ref 35.0–45.0)
Hemoglobin: 12.4 g/dL (ref 11.7–15.5)
Lymphs Abs: 1619 cells/uL (ref 850–3900)
MCH: 31.6 pg (ref 27.0–33.0)
MCHC: 32.6 g/dL (ref 32.0–36.0)
MCV: 96.7 fL (ref 80.0–100.0)
MPV: 9.6 fL (ref 7.5–12.5)
Monocytes Relative: 20 %
Neutro Abs: 1707 cells/uL (ref 1500–7800)
Neutrophils Relative %: 37.1 %
Platelets: 253 10*3/uL (ref 140–400)
RBC: 3.93 10*6/uL (ref 3.80–5.10)
RDW: 13.1 % (ref 11.0–15.0)
Total Lymphocyte: 35.2 %
WBC: 4.6 10*3/uL (ref 3.8–10.8)

## 2022-01-26 LAB — COMPLETE METABOLIC PANEL WITH GFR
AG Ratio: 1.5 (calc) (ref 1.0–2.5)
ALT: 11 U/L (ref 6–29)
AST: 19 U/L (ref 10–35)
Albumin: 3.8 g/dL (ref 3.6–5.1)
Alkaline phosphatase (APISO): 95 U/L (ref 37–153)
BUN: 12 mg/dL (ref 7–25)
CO2: 27 mmol/L (ref 20–32)
Calcium: 9.9 mg/dL (ref 8.6–10.4)
Chloride: 104 mmol/L (ref 98–110)
Creat: 0.69 mg/dL (ref 0.60–1.00)
Globulin: 2.6 g/dL (calc) (ref 1.9–3.7)
Glucose, Bld: 81 mg/dL (ref 65–99)
Potassium: 3.6 mmol/L (ref 3.5–5.3)
Sodium: 138 mmol/L (ref 135–146)
Total Bilirubin: 0.3 mg/dL (ref 0.2–1.2)
Total Protein: 6.4 g/dL (ref 6.1–8.1)
eGFR: 88 mL/min/{1.73_m2} (ref >=60)

## 2022-01-26 NOTE — Progress Notes (Signed)
CBC and CMP WNL

## 2022-01-27 ENCOUNTER — Other Ambulatory Visit (HOSPITAL_COMMUNITY): Payer: Self-pay

## 2022-01-27 ENCOUNTER — Telehealth: Payer: Self-pay | Admitting: Pharmacist

## 2022-01-27 DIAGNOSIS — Z79899 Other long term (current) drug therapy: Secondary | ICD-10-CM

## 2022-01-27 DIAGNOSIS — M0609 Rheumatoid arthritis without rheumatoid factor, multiple sites: Secondary | ICD-10-CM

## 2022-01-27 NOTE — Telephone Encounter (Signed)
Received notification from [facility] regarding a prior authorization for ENBREL. Authorization has been APPROVED from 01/27/22 to 07/26/22.   Per test claim, patient has no copay for 28 days supply.  Patient can fill through [facility]: [phone]   Authorization # [id number]  Will stay on hydroxychloroquine but will d/c leflunomide. Spoke with patient. She states she will have to call her daughter and determine what works best for her. I advised her of my clinic hours (Mon-Thurs in mornings). She will return call  Knox Saliva, PharmD, MPH, BCPS, CPP Clinical Pharmacist (Rheumatology and Pulmonology)

## 2022-01-27 NOTE — Telephone Encounter (Signed)
Submitted a Prior Authorization request to [facility] for ENBREL via CoverMyMeds. Will update once we receive a response.  Key: [id number]  Patient may need [place] pt assistance application completed  Knox Saliva, PharmD, MPH, BCPS, CPP Clinical Pharmacist (Rheumatology and Pulmonology)

## 2022-01-30 NOTE — Telephone Encounter (Signed)
Called patient to see if she had spoken with her daughter about what day of the week works best for her. Patient states that her daughter will be calling our office sometime soon to schedule her new start appointment. Daughter is Cherrie Gauze and her phone number is [phone].  Rodolph Bong, PharmD Candidate 01/30/2022 9:35 AM

## 2022-01-31 NOTE — Telephone Encounter (Signed)
Patient scheduled for Enbrel new start on 02/01/22  Knox Saliva, PharmD, MPH, BCPS, CPP Clinical Pharmacist (Rheumatology and Pulmonology)

## 2022-02-01 ENCOUNTER — Other Ambulatory Visit (HOSPITAL_COMMUNITY): Payer: Self-pay

## 2022-02-01 ENCOUNTER — Ambulatory Visit (INDEPENDENT_AMBULATORY_CARE_PROVIDER_SITE_OTHER): Payer: Medicare Other | Admitting: Pharmacist

## 2022-02-01 VITALS — BP 137/75 | HR 90

## 2022-02-01 DIAGNOSIS — M0609 Rheumatoid arthritis without rheumatoid factor, multiple sites: Secondary | ICD-10-CM

## 2022-02-01 DIAGNOSIS — Z79899 Other long term (current) drug therapy: Secondary | ICD-10-CM

## 2022-02-01 MED ORDER — ENBREL MINI 50 MG/ML ~~LOC~~ SOCT
50.0000 mg | SUBCUTANEOUS | 0 refills | Status: DC
  Filled 2022-02-01: qty 12, fill #0
  Filled 2022-02-01: qty 4, 28d supply, fill #0
  Filled 2022-02-22: qty 4, 28d supply, fill #1

## 2022-02-01 NOTE — Patient Instructions (Addendum)
Your next ENBREL dose is due on 02/08/22 and every 7 days thereafter  [place] if you have signs or symptoms of an infection. You can resume once you feel better or back to your baseline. HOLD ENBREL if you start antibiotics to treat an infection. HOLD ENBREL around the time of surgery/procedures. Your surgeon will be able to provide recommendations on when to hold BEFORE and when you are cleared to [place].  Pharmacy information: Your prescription will be shipped from [facility]. Their phone number is [phone]  Pharmacy will mail the medication to your house  Labs are due in 1 month then every 3 months. Lab hours are from Monday to Thursday 1:30-4:30pm and Friday 1:30-4pm. You do not need an appointment if you come for labs during these times.  How to manage an injection site reaction: Remember the 5 C's: COUNTER - leave on the counter at least 30 minutes but up to overnight to bring medication to room temperature. This may help prevent stinging COLD - place something cold (like an ice gel pack or cold water bottle) on the injection site just before cleansing with alcohol. This may help reduce pain CLARITIN - use Claritin (generic name is loratadine) for the first two weeks of treatment or the day of, the day before, and the day after injecting. This will help to minimize injection site reactions CORTISONE CREAM - apply if injection site is irritated and itching CALL ME - if injection site reaction is bigger than the size of your fist, looks infected, blisters, or if you develop hives

## 2022-02-01 NOTE — Progress Notes (Signed)
Pharmacy Note  Subjective:   Patient presents to clinic today to receive first dose of Enbrel.  Patient running a fever or have signs/symptoms of infection? No  Patient currently on antibiotics for the treatment of infection? No  Patient have any upcoming invasive procedures/surgeries? No  Objective: CMP     Component Value Date/Time   NA 138 01/25/2022 1420   NA 139 12/08/2021 1055   K 3.6 01/25/2022 1420   CL 104 01/25/2022 1420   CO2 27 01/25/2022 1420   GLUCOSE 81 01/25/2022 1420   BUN 12 01/25/2022 1420   BUN 10 12/08/2021 1055   CREATININE 0.69 01/25/2022 1420   CALCIUM 9.9 01/25/2022 1420   PROT 6.4 01/25/2022 1420   PROT 6.7 12/08/2021 1055   ALBUMIN 4.2 12/08/2021 1055   AST 19 01/25/2022 1420   ALT 11 01/25/2022 1420   ALKPHOS 114 12/08/2021 1055   BILITOT 0.3 01/25/2022 1420   BILITOT 0.3 12/08/2021 1055   GFRNONAA 69 09/15/2020 1536   GFRAA 79 09/15/2020 1536    CBC    Component Value Date/Time   WBC 4.6 01/25/2022 1420   RBC 3.93 01/25/2022 1420   HGB 12.4 01/25/2022 1420   HGB 12.9 12/08/2021 1055   HCT 38.0 01/25/2022 1420   HCT 37.2 12/08/2021 1055   PLT 253 01/25/2022 1420   PLT 254 12/08/2021 1055   MCV 96.7 01/25/2022 1420   MCV 91 12/08/2021 1055   MCH 31.6 01/25/2022 1420   MCHC 32.6 01/25/2022 1420   RDW 13.1 01/25/2022 1420   RDW 12.7 12/08/2021 1055   LYMPHSABS 1,619 01/25/2022 1420   LYMPHSABS 1.2 12/08/2021 1055   MONOABS 1.0 05/24/2021 0843   EOSABS 285 01/25/2022 1420   EOSABS 0.2 12/08/2021 1055   BASOSABS 69 01/25/2022 1420   BASOSABS 0.0 12/08/2021 1055    Baseline Immunosuppressant Therapy Labs TB GOLD    Latest Ref Rng & Units 10/11/2021    2:13 PM  Quantiferon TB Gold  Quantiferon TB Gold Plus NEGATIVE NEGATIVE    Hepatitis Panel    Latest Ref Rng & Units 10/11/2021    2:13 PM  Hepatitis  Hep B Surface Ag NON-REACTIVE NON-REACTIVE   Hep B IgM NON-REACTIVE NON-REACTIVE   Hep C Ab NON-REACTIVE NON-REACTIVE     HIV No results found for: "HIV" Immunoglobulins    Latest Ref Rng & Units 10/11/2021    2:13 PM  Immunoglobulin Electrophoresis  IgA  70 - 320 mg/dL 202   IgG 600 - 1,540 mg/dL 1,500   IgM 50 - 300 mg/dL 31    SPEP    Latest Ref Rng & Units 01/25/2022    2:20 PM  Serum Protein Electrophoresis  Total Protein 6.1 - 8.1 g/dL 6.4    G6PD No results found for: "G6PDH" TPMT No results found for: "TPMT"   Chest x-ray: 10/23/11 IMPRESSION:  Negative exam.   Assessment/Plan:  Demonstrated proper injection technique with Enbrel demo device  Patient able to demonstrate proper injection technique using the teach back method.  Patient self injected in the right thigh with:  Sample Medication: Enbrel NDC: 82993-716-96 Lot: [id number] Expiration: 10/12/2023  Sample medication: Enbrel AutoTouch Connect (patient to keep) [place]: 17510-258-52 Lot: [id number] Expiration: 10/11/2024   Patient tolerated well.  Observed for 30 mins in office for adverse reaction and patient tolerated well. She did report stinging during injection.    Patient is to return in 1 month for labs and follow-up appointment.  Standing orders placed.  Enbrel approved through insurance .   Rx sent to: [facility]: [phone] .  Patient provided with pharmacy phone number and confirmed address on file is accurate for shipment.   All questions encouraged and answered.  Instructed patient to call with any further questions or concerns.  Joseph Art, Pharm.D. PGY-1 Pharmacy Resident 02/01/2022 8:09 AM

## 2022-02-01 NOTE — Telephone Encounter (Signed)
Delivery instructions have been updated in [facility], medication will be shipped to patient's home address by 02/03/22.  Rx has been processed in [facility] and the patient has no copay at this time.

## 2022-02-04 ENCOUNTER — Other Ambulatory Visit: Payer: Self-pay | Admitting: Physician Assistant

## 2022-02-04 DIAGNOSIS — M0609 Rheumatoid arthritis without rheumatoid factor, multiple sites: Secondary | ICD-10-CM

## 2022-02-08 ENCOUNTER — Telehealth: Payer: Self-pay | Admitting: *Deleted

## 2022-02-08 NOTE — Telephone Encounter (Signed)
Called patient regarding her voicemail about her Enbrel. Per patient, she started the injection but did not have a good grip on the injector and moved it, losing some of the dose. She states she did feel an injection and got some of the medication. Since there is no way to be sure how much medication she received, advised the patient to not double up on her dose. She should continue with her normal dosing schedule next Wednesday.   Rodolph Bong, PharmD Candidate 02/08/2022 11:34 AM

## 2022-02-08 NOTE — Telephone Encounter (Signed)
Patient contacted the office and left a voicemail requesting a return call. Patient states "I messed something up with my Enbrel". She would like to know what she should do.

## 2022-02-22 ENCOUNTER — Other Ambulatory Visit (HOSPITAL_COMMUNITY): Payer: Self-pay

## 2022-02-22 NOTE — Progress Notes (Unsigned)
Office Visit Note  Patient: Ann Gamble             Date of Birth: June 12, 1941           MRN: [id number]             PCP: Maryella Shivers, MD Referring: Maryella Shivers, MD Visit Date: 03/08/2022 Occupation: '@GUAROCC'$ @  Subjective:  Medication monitoring   History of Present Illness: [name] is a 81 y.o. female with history of seronegative rheumatoid arthritis and osteoarthritis.  She is currently on Enbrel-started 02/01/22 and is taking plaquenil 200 mg 1 tablet twice daily M-F.  She discontinued arava once she started on Enbrel.  She has been tolerating Enbrel without any side effects or injection site reactions.  She has noticed improvement in the pain and stiffness in her right wrist.  She is also noticed less swelling in the tendons of her right wrist.     Activities of Daily Living:  Patient reports morning stiffness for 30 minutes.   Patient Reports nocturnal pain.  Difficulty dressing/grooming: Denies Difficulty climbing stairs: Reports Difficulty getting out of chair: Reports Difficulty using hands for taps, buttons, cutlery, and/or writing: Reports  Review of Systems  Constitutional:  Negative for fatigue.  HENT:  Negative for mouth sores, mouth dryness and nose dryness.   Eyes:  Negative for pain, visual disturbance and dryness.  Respiratory:  Negative for cough, hemoptysis, shortness of breath and difficulty breathing.   Cardiovascular:  Positive for swelling in legs/feet. Negative for chest pain, palpitations and hypertension.  Gastrointestinal:  Negative for blood in stool, constipation and diarrhea.  Endocrine: Negative for increased urination.  Genitourinary:  Negative for painful urination and involuntary urination.  Musculoskeletal:  Positive for joint pain, joint pain, joint swelling, myalgias, morning stiffness, muscle tenderness and myalgias. Negative for muscle weakness.  Skin:  Positive for sensitivity to sunlight. Negative for color  change, pallor, hair loss, nodules/bumps, skin tightness and ulcers.  Allergic/Immunologic: Negative for susceptible to infections.  Neurological:  Negative for dizziness, numbness, headaches and weakness.  Hematological:  Negative for swollen glands.  Psychiatric/Behavioral:  Positive for sleep disturbance. Negative for depressed mood. The patient is not nervous/anxious.     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 factor ([place]) 09/04/2016   High risk medication use 09/04/2016   Primary osteoarthritis of right knee 09/04/2016   History of total knee replacement, left 09/04/2016   Primary osteoarthritis of both hands 09/04/2016   Primary osteoarthritis of both feet 09/04/2016   Essential hypertension 09/04/2016   Dyslipidemia 09/04/2016    Past Medical History:  Diagnosis Date   Allergy    Anemia    Bilateral carpal tunnel syndrome    Cataract immature    right   Chronic gastritis    Constipation    takes stool softener nightly   Foot deformity, bilateral    GERD (gastroesophageal reflux disease)    Hx of colonic polyps    Hyperlipidemia    takes Simvastatin daily   Hypertension    takes Hyzaar and Amlodipine daily   IPMN (intraductal papillary mucinous neoplasm)    1.2cm on CT 11-28-2016   OA (osteoarthritis)    Osteoporosis    RA (rheumatoid arthritis) (HCC)    Seasonal allergies    Sensitive skin    pt states she has highly sensitive skin   Urinary  incontinence     Family History  Problem Relation Age of Onset   Thyroid disease Mother    Alzheimer's disease Mother    Stroke Brother    Diabetes Son    Anesthesia problems Neg Hx    Hypotension Neg Hx    Pseudochol deficiency Neg Hx    Malignant hyperthermia Neg Hx    Colon cancer Neg Hx    Rectal cancer Neg Hx    Stomach cancer Neg Hx    Past Surgical History:  Procedure  Laterality Date   CATARACT EXTRACTION Bilateral    COLONOSCOPY  11/20/2016   Mild sigmoid diverticulosis. Otherwise normal colonoscopy to terminal ileum   DENTAL SURGERY     ESOPHAGOGASTRODUODENOSCOPY  11/06/2016   Transient schatzki's ring status post esophageal dilatation. Small hiatal hernia. 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   Procedure: TOTAL KNEE ARTHROPLASTY;  Surgeon: Kerin Salen, MD;  Location: [facility];  Service: Orthopedics;  Laterality: Left;  DEPUY/SIGMA   TOTAL KNEE ARTHROPLASTY Right 08/05/2018   Procedure: TOTAL KNEE ARTHROPLASTY;  Surgeon: Vickey Huger, MD;  Location: [facility];  Service: Orthopedics;  Laterality: Right;   VAGINAL HYSTERECTOMY  1970's   partial   Social History   Social History Narrative   Not on file   Immunization History  Administered Date(s) Administered   Moderna Sars-Covid-2 Vaccination 09/18/2019, 10/14/2019, 07/19/2020     Objective: Vital Signs: BP (!) 147/79 (BP Location: Left Arm, Patient Position: Sitting, Cuff Size: Normal)   Pulse 96   Ht '4\' 11"'$  (1.499 m)   Wt 152 lb 9.6 oz (69.2 kg)   BMI 30.82 kg/m    Physical Exam Vitals and nursing note reviewed.  Constitutional:      Appearance: She is well-developed.  HENT:     Head: Normocephalic and atraumatic.  Eyes:     Conjunctiva/sclera: Conjunctivae normal.  Cardiovascular:     Rate and Rhythm: Normal rate and regular rhythm.     Heart sounds: Normal heart sounds.  Pulmonary:     Effort: Pulmonary effort is normal.     Breath sounds: Normal breath sounds.  Abdominal:     General: Bowel sounds are normal.     Palpations: Abdomen is soft.  Musculoskeletal:     Cervical back: Normal range of motion.  Skin:    General: Skin is warm and dry.     Capillary Refill: Capillary refill takes less than 2 seconds.  Neurological:     Mental Status: She is alert and oriented to person, place, and time.  Psychiatric:        Behavior: Behavior  normal.      Musculoskeletal Exam: C-spine limited ROM.  Thoracic kyphosis noted.  Shoulder joints have good ROM with no discomfort.  Elbow joints have good ROM with no tenderness.  Mild extensor tenosynovitis of the right wrist-improving. PIP and DIP thickening and subluxation noted.  Hip joints have good ROM with no groin pain.  Both knee replacements have good ROM with some discomfort in the right knee.  Warmth and tenderness of the right ankle joint.   CDAI Exam: CDAI Score: 1.8  Patient Global: 4 mm; Provider Global: 4 mm Swollen: 0 ; Tender: 2  Joint Exam 03/08/2022      Right  Left  Wrist   Tender     Ankle   Tender        Investigation: No additional findings.  Imaging: No results found.  Recent Labs: Lab Results  Component Value Date   WBC 4.6 01/25/2022   HGB 12.4 01/25/2022   PLT 253 01/25/2022   NA 138 01/25/2022   K 3.6 01/25/2022   CL 104 01/25/2022   CO2 27 01/25/2022   GLUCOSE 81 01/25/2022   BUN 12 01/25/2022   CREATININE 0.69 01/25/2022   BILITOT 0.3 01/25/2022   ALKPHOS 114 12/08/2021   AST 19 01/25/2022   ALT 11 01/25/2022   PROT 6.4 01/25/2022   ALBUMIN 4.2 12/08/2021   CALCIUM 9.9 01/25/2022   GFRAA 79 09/15/2020   QFTBGOLDPLUS NEGATIVE 10/11/2021    Speciality Comments: PLQ Eye Exam: 10/15/2020 WNL @ [facility] Follow up in  12 months   Procedures:  No procedures performed Allergies: Lactose intolerance (gi); Antihistamines, chlorpheniramine-type; Other; and Tape   Assessment / Plan:     Visit Diagnoses: Rheumatoid arthritis of multiple sites with negative rheumatoid factor (HCC) - +RF, +CCP: She presents today with mild extensor tenosynovitis of the right wrist joint.  Overall her right wrist joint pain and inflammation has improved significantly since initiating Enbrel on 02/01/2022.  The synovitis on the ulnar aspect of her right wrist has almost completely resolved.  She has been tolerating Enbrel without any side effects or injection  site reactions.  She has not had any recurrent or recent infections.  She continues to take Plaquenil 200 mg 1 tablet by mouth twice daily Monday through Friday.  She is willing to give Enbrel more time and for [place] to reassess for the full efficacy in 2 to 3 months.  She was advised to notify us if she develops any new or worsening symptoms.  High risk medication use - Enbrel 50 mg sq injections once weekly-started 02/01/22. She is taking plaquenil 200 mg 1 tablet twice daily M-F.  Inadequate response to arava.   - Plan: COMPLETE METABOLIC PANEL WITH GFR, CBC with Differential/Platelet CBC and CMP updated on 01/25/22. Orders for CBC and CMP released.  Her next lab work will be due in October and every 3 months.  TB gold negative on 10/11/21.  PLQ Eye Exam: 10/15/2020 WNL @ [facility].  She is scheduled for an eye exam on 03/31/22.   She has not had any recent or recurrent infections. Discussed the importance of holding enbrel if she develops signs or symptoms of an infection and to resume once the infection has completely cleared.  Discussed the importance of yearly skin exams while on enbrel due to the risk for skin cancer.  Extensor tenosynovitis of right wrist: Improving   Primary osteoarthritis of both hands: She has PIP and DIP thickening consistent with osteoarthritis of both hands.  Subluxation of several PIP and DIP joints noted.  History of total right knee replacement: Chronic pain and stiffness.    History of total knee replacement, left: Doing well.  She has good ROM with no discomfort.  No warmth or effusion noted.   Primary osteoarthritis of both feet: She presents today with some tenderness and warmth of the right ankle joint on examination today.  She is wearing proper fitting shoes.  Other medical conditions are listed as follows:  History of hypertension  History of hyperlipidemia  Orders: Orders Placed This Encounter  Procedures   COMPLETE METABOLIC PANEL WITH GFR   CBC  with Differential/Platelet   No orders of the defined types were placed in this encounter.    Follow-Up Instructions: Return in about 3 months (around 06/08/2022) for Rheumatoid arthritis,  Osteoarthritis.   Ofilia Neas, PA-C  Note - This record has been created using Dragon software.  Chart creation errors have been sought, but may not always  have been located. Such creation errors do not reflect on  the standard of medical care.

## 2022-02-28 ENCOUNTER — Other Ambulatory Visit (HOSPITAL_COMMUNITY): Payer: Self-pay

## 2022-03-08 ENCOUNTER — Ambulatory Visit (INDEPENDENT_AMBULATORY_CARE_PROVIDER_SITE_OTHER): Payer: Medicare Other | Admitting: Physician Assistant

## 2022-03-08 ENCOUNTER — Encounter: Payer: Self-pay | Admitting: Physician Assistant

## 2022-03-08 VITALS — BP 147/79 | HR 96 | Ht 59.0 in | Wt 152.6 lb

## 2022-03-08 DIAGNOSIS — Z8679 Personal history of other diseases of the circulatory system: Secondary | ICD-10-CM

## 2022-03-08 DIAGNOSIS — Z96652 Presence of left artificial knee joint: Secondary | ICD-10-CM

## 2022-03-08 DIAGNOSIS — M0609 Rheumatoid arthritis without rheumatoid factor, multiple sites: Secondary | ICD-10-CM

## 2022-03-08 DIAGNOSIS — M65831 Other synovitis and tenosynovitis, right forearm: Secondary | ICD-10-CM

## 2022-03-08 DIAGNOSIS — M65931 Unspecified synovitis and tenosynovitis, right forearm: Secondary | ICD-10-CM

## 2022-03-08 DIAGNOSIS — Z8639 Personal history of other endocrine, nutritional and metabolic disease: Secondary | ICD-10-CM

## 2022-03-08 DIAGNOSIS — M19041 Primary osteoarthritis, right hand: Secondary | ICD-10-CM

## 2022-03-08 DIAGNOSIS — Z96651 Presence of right artificial knee joint: Secondary | ICD-10-CM

## 2022-03-08 DIAGNOSIS — M19042 Primary osteoarthritis, left hand: Secondary | ICD-10-CM

## 2022-03-08 DIAGNOSIS — M19072 Primary osteoarthritis, left ankle and foot: Secondary | ICD-10-CM

## 2022-03-08 DIAGNOSIS — Z79899 Other long term (current) drug therapy: Secondary | ICD-10-CM | POA: Diagnosis not present

## 2022-03-08 DIAGNOSIS — M19071 Primary osteoarthritis, right ankle and foot: Secondary | ICD-10-CM

## 2022-03-08 NOTE — Patient Instructions (Signed)
Standing Labs We placed an order today for your standing lab work.   Please have your standing labs drawn in October and every 3 months   If possible, please have your labs drawn 2 weeks prior to your appointment so that the provider can discuss your results at your appointment.  Please note that you may see your imaging and lab results in MyChart before we have reviewed them. We may be awaiting multiple results to interpret others before contacting you. Please allow our office up to 72 hours to thoroughly review all of the results before contacting the office for clarification of your results.  We have open lab daily: Monday through Thursday from 1:30-4:30 PM and Friday from 1:30-4:00 PM at the office of Dr. Shaili Deveshwar, [facility].   Please be advised, all patients with office appointments requiring lab work will take precedent over walk-in lab work.  If possible, please come for your lab work on Monday and Friday afternoons, as you may experience shorter wait times. The office is located at [address], [address], [place],  [address] No appointment is necessary.   Labs are drawn by [facility]. Please bring your co-pay at the time of your lab draw.  You may receive a bill from [facility] for your lab work.  Please note if you are on Hydroxychloroquine and and an order has been placed for a Hydroxychloroquine level, you will need to have it drawn 4 hours or more after your last dose.  If you wish to have your labs drawn at another location, please call the office 24 hours in advance to send orders.  If you have any questions regarding directions or hours of operation,  please call [phone].   As a reminder, please drink plenty of water prior to coming for your lab work. Thanks!    

## 2022-03-09 NOTE — Progress Notes (Signed)
CMP WNL. RBC count, hgb, and hct low consistent with anemia. MCV and MCH are slightly elevated concerning for macrocytic anemia. Please add vitamin B12 and folate if possible.

## 2022-03-10 LAB — CBC WITH DIFFERENTIAL/PLATELET
Absolute Monocytes: 836 cells/uL (ref 200–950)
Basophils Absolute: 50 cells/uL (ref 0–200)
Basophils Relative: 0.9 %
Eosinophils Absolute: 330 cells/uL (ref 15–500)
Eosinophils Relative: 6 %
HCT: 33.9 % — ABNORMAL LOW (ref 35.0–45.0)
Hemoglobin: 11.2 g/dL — ABNORMAL LOW (ref 11.7–15.5)
Lymphs Abs: 2035 cells/uL (ref 850–3900)
MCH: 33.5 pg — ABNORMAL HIGH (ref 27.0–33.0)
MCHC: 33 g/dL (ref 32.0–36.0)
MCV: 101.5 fL — ABNORMAL HIGH (ref 80.0–100.0)
MPV: 10.2 fL (ref 7.5–12.5)
Monocytes Relative: 15.2 %
Neutro Abs: 2250 cells/uL (ref 1500–7800)
Neutrophils Relative %: 40.9 %
Platelets: 210 10*3/uL (ref 140–400)
RBC: 3.34 10*6/uL — ABNORMAL LOW (ref 3.80–5.10)
RDW: 13.6 % (ref 11.0–15.0)
Total Lymphocyte: 37 %
WBC: 5.5 10*3/uL (ref 3.8–10.8)

## 2022-03-10 LAB — COMPLETE METABOLIC PANEL WITH GFR
AG Ratio: 1.6 (calc) (ref 1.0–2.5)
ALT: 12 U/L (ref 6–29)
AST: 18 U/L (ref 10–35)
Albumin: 3.9 g/dL (ref 3.6–5.1)
Alkaline phosphatase (APISO): 77 U/L (ref 37–153)
BUN: 13 mg/dL (ref 7–25)
CO2: 25 mmol/L (ref 20–32)
Calcium: 9.7 mg/dL (ref 8.6–10.4)
Chloride: 106 mmol/L (ref 98–110)
Creat: 0.67 mg/dL (ref 0.60–0.95)
Globulin: 2.5 g/dL (calc) (ref 1.9–3.7)
Glucose, Bld: 85 mg/dL (ref 65–99)
Potassium: 3.8 mmol/L (ref 3.5–5.3)
Sodium: 141 mmol/L (ref 135–146)
Total Bilirubin: 0.4 mg/dL (ref 0.2–1.2)
Total Protein: 6.4 g/dL (ref 6.1–8.1)
eGFR: 88 mL/min/{1.73_m2} (ref >=60)

## 2022-03-10 LAB — TEST AUTHORIZATION

## 2022-03-10 LAB — B12 AND FOLATE PANEL
Folate: >24 ng/mL
Vitamin B-12: 749 pg/mL (ref 200–1100)

## 2022-03-10 NOTE — Progress Notes (Signed)
Folate and Vitamin B12 WNL.

## 2022-03-15 ENCOUNTER — Other Ambulatory Visit (HOSPITAL_COMMUNITY): Payer: Self-pay

## 2022-03-15 ENCOUNTER — Other Ambulatory Visit: Payer: Self-pay | Admitting: Pharmacist

## 2022-03-15 DIAGNOSIS — M0609 Rheumatoid arthritis without rheumatoid factor, multiple sites: Secondary | ICD-10-CM

## 2022-03-15 DIAGNOSIS — Z79899 Other long term (current) drug therapy: Secondary | ICD-10-CM

## 2022-03-15 MED ORDER — ENBREL MINI 50 MG/ML ~~LOC~~ SOCT
50.0000 mg | SUBCUTANEOUS | 0 refills | Status: DC
  Filled 2022-03-15 – 2022-03-27 (×2): qty 4, 28d supply, fill #0

## 2022-03-15 NOTE — Telephone Encounter (Signed)
Rx for Enbrel Mini '50mg'$  every 7 days sent to [facility] for hospital-based cost pricing.   Total qty remaining is 4 ml  Knox Saliva, PharmD, MPH, BCPS, CPP Clinical Pharmacist (Rheumatology and Pulmonology)

## 2022-03-27 ENCOUNTER — Other Ambulatory Visit (HOSPITAL_COMMUNITY): Payer: Self-pay

## 2022-03-30 ENCOUNTER — Other Ambulatory Visit (HOSPITAL_COMMUNITY): Payer: Self-pay

## 2022-04-12 ENCOUNTER — Telehealth: Payer: Self-pay | Admitting: Rheumatology

## 2022-04-12 NOTE — Telephone Encounter (Signed)
Patient called the office stating the Enbrel is not working and she wishes to stop. Patient states her arm is now swelling. Patient states she would like to speak with Lovena Le about this.

## 2022-04-13 NOTE — Telephone Encounter (Signed)
I returned patient's call.  Patient stated she took the last Enbrel injection about 2 weeks ago.  She does not want to take Enbrel injections anymore.  I discussed the option of taking Enbrel every other week but she declined.  I also discussed possibly switching her to Simponi subcu injections but she declined.  I discussed the option of switching her to oral Xeljanz tablets.  She did not like the side effects of Xeljanz.  I discussed leflunomide which she was taking prior to being on Enbrel but she does not recall taking that medication.  I offered an earlier appointment to discuss treatment options but she declined.  She stated that she will discuss when she returns on October 31.  She does not want to take any other medications at this point.

## 2022-04-13 NOTE — Telephone Encounter (Signed)
Patient started Enbrel on 02/01/22. Generally a three month trial of medication is required. I called patient for further details. She states that she doesn't feel any improvement. She states that with each injection, she gets nervous. She states she "just doesn't want to take the medication." She states she is having swelling in hands. She states she feels like she feels better when she gets up then had hand swelling throughout the day.  Her next appt is on 06/13/22 with Dr. Estanislado Pandy. I offered that she can make a sooner OV appointment if she'd like to discuss other treatment options. At this time she prefers to keep appointment as is. Recommended she reach out to [place] with any issues or concerns.  Her last dose of Enbrel was on 04/05/22 and did not take yesterday. After multiple recommendations for her to give the medication a three month trial period, she states she herself prefers to discontinue.  Knox Saliva, PharmD, MPH, BCPS, CPP Clinical Pharmacist (Rheumatology and Pulmonology)

## 2022-04-24 ENCOUNTER — Other Ambulatory Visit (HOSPITAL_COMMUNITY): Payer: Self-pay

## 2022-05-02 ENCOUNTER — Other Ambulatory Visit: Payer: Self-pay | Admitting: Physician Assistant

## 2022-05-02 DIAGNOSIS — M0609 Rheumatoid arthritis without rheumatoid factor, multiple sites: Secondary | ICD-10-CM

## 2022-05-03 NOTE — Telephone Encounter (Signed)
Patient states she will call back and reschedule.

## 2022-05-03 NOTE — Telephone Encounter (Signed)
Please schedule patient a follow up visit. Patient due October 2023. Thanks!  

## 2022-05-03 NOTE — Telephone Encounter (Signed)
Next Visit: Due October 2023. Message sent to the front to schedule.   Last Visit: 03/08/2022  Labs: 03/08/2022 CMP WNL. RBC count, hgb, and hct low consistent with anemia. MCV and MCH are slightly elevated concerning for macrocytic anemia. Folate and Vitamin B12 WNL.   Eye exam: 04/05/2022 WNL    Current Dose per office note 03/08/2022: plaquenil 200 mg 1 tablet twice daily M-F  SH:[id number] arthritis of multiple sites with negative rheumatoid factor   Last Fill: 02/06/2022  Okay to refill Plaquenil?

## 2022-06-12 ENCOUNTER — Other Ambulatory Visit (HOSPITAL_COMMUNITY): Payer: Self-pay

## 2022-06-13 ENCOUNTER — Ambulatory Visit: Payer: Medicare Other | Admitting: Rheumatology

## 2022-06-14 ENCOUNTER — Other Ambulatory Visit (HOSPITAL_COMMUNITY): Payer: Self-pay

## 2022-06-30 ENCOUNTER — Encounter: Payer: Self-pay | Admitting: Pharmacist

## 2022-06-30 NOTE — Telephone Encounter (Signed)
Error

## 2022-07-20 NOTE — Progress Notes (Signed)
Office Visit Note  Patient: Ann Gamble             Date of Birth: Oct 23, 1940           MRN: [id number]             PCP: Maryella Shivers, MD Referring: Maryella Shivers, MD Visit Date: 08/02/2022 Occupation: '@GUAROCC'$ @  Subjective:  Medication management  History of Present Illness: Ann Gamble is a 81 y.o. female I discussed rheumatoid arthritis, and osteoarthritis.  According the patient she moved furniture around in her room.  She has been trying to reach across her body to the nightstand and the table lamp.  She also has been helping her her grandson who is heavy.  She has been having increased pain and discomfort in her right shoulder for the last month.  She states her right wrist is better.  She continues to have pain and discomfort in her bilateral knee joints which are replaced.  She is going to pain management which helps.  She notices pedal edema but she has not noticed any joint swelling.  Activities of Daily Living:  Patient reports morning stiffness for several hours.   Patient Reports nocturnal pain.  Difficulty dressing/grooming: Denies Difficulty climbing stairs: Reports Difficulty getting out of chair: Denies Difficulty using hands for taps, buttons, cutlery, and/or writing: Reports  Review of Systems  Constitutional:  Positive for fatigue.  HENT:  Positive for mouth dryness. Negative for mouth sores.   Eyes:  Negative for dryness.  Respiratory:  Negative for difficulty breathing.   Cardiovascular:  Positive for swelling in legs/feet. Negative for chest pain and palpitations.  Gastrointestinal:  Negative for blood in stool, constipation and diarrhea.  Endocrine: Negative for increased urination.  Genitourinary:  Negative for involuntary urination.  Musculoskeletal:  Positive for joint pain, joint pain, myalgias, morning stiffness, muscle tenderness and myalgias. Negative for gait problem, joint swelling and muscle weakness.  Skin:  Negative for color  change, rash, hair loss and sensitivity to sunlight.  Allergic/Immunologic: Negative for susceptible to infections.  Neurological:  Negative for dizziness and headaches.  Hematological:  Negative for swollen glands.  Psychiatric/Behavioral:  Negative for depressed mood and sleep disturbance. The patient is not nervous/anxious.     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 factor ([place]) 09/04/2016   High risk medication use 09/04/2016   Primary osteoarthritis of right knee 09/04/2016   History of total knee replacement, left 09/04/2016   Primary osteoarthritis of both hands 09/04/2016   Primary osteoarthritis of both feet 09/04/2016   Essential hypertension 09/04/2016   Dyslipidemia 09/04/2016    Past Medical History:  Diagnosis Date   Allergy    Anemia    Bilateral carpal tunnel syndrome    Cataract immature    right   Chronic gastritis    Constipation    takes stool softener nightly   Foot deformity, bilateral    GERD (gastroesophageal reflux disease)    Hx of colonic polyps    Hyperlipidemia    takes Simvastatin daily   Hypertension    takes Hyzaar and Amlodipine daily   IPMN (intraductal papillary mucinous neoplasm)    1.2cm on CT 11-28-2016   OA (osteoarthritis)    Osteoporosis    RA (rheumatoid arthritis) (HCC)    Seasonal allergies    Sensitive skin    pt states she  has highly sensitive skin   Urinary incontinence     Family History  Problem Relation Age of Onset   Thyroid disease Mother    Alzheimer's disease Mother    Stroke Brother    Diabetes Son    Anesthesia problems Neg Hx    Hypotension Neg Hx    Pseudochol deficiency Neg Hx    Malignant hyperthermia Neg Hx    Colon cancer Neg Hx    Rectal cancer Neg Hx    Stomach cancer Neg Hx    Past Surgical History:  Procedure Laterality Date   CATARACT EXTRACTION Bilateral     COLONOSCOPY  11/20/2016   Mild sigmoid diverticulosis. Otherwise normal colonoscopy to terminal ileum   DENTAL SURGERY     ESOPHAGOGASTRODUODENOSCOPY  11/06/2016   Transient schatzki's ring status post esophageal dilatation. Small hiatal hernia. 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   Procedure: TOTAL KNEE ARTHROPLASTY;  Surgeon: Kerin Salen, MD;  Location: [facility];  Service: Orthopedics;  Laterality: Left;  DEPUY/SIGMA   TOTAL KNEE ARTHROPLASTY Right 08/05/2018   Procedure: TOTAL KNEE ARTHROPLASTY;  Surgeon: Vickey Huger, MD;  Location: [facility];  Service: Orthopedics;  Laterality: Right;   VAGINAL HYSTERECTOMY  1970's   partial   Social History   Social History Narrative   Not on file   Immunization History  Administered Date(s) Administered   Moderna Sars-Covid-2 Vaccination 09/18/2019, 10/14/2019, 07/19/2020     Objective: Vital Signs: BP 123/69 (BP Location: Left Arm, Patient Position: Sitting, Cuff Size: Normal)   Pulse 92   Ht '4\' 11"'$  (1.499 m)   Wt 156 lb 9.6 oz (71 kg)   BMI 31.63 kg/m    Physical Exam Vitals and nursing note reviewed.  Constitutional:      Appearance: She is well-developed.  HENT:     Head: Normocephalic and atraumatic.  Eyes:     Conjunctiva/sclera: Conjunctivae normal.  Cardiovascular:     Rate and Rhythm: Normal rate and regular rhythm.     Heart sounds: Normal heart sounds.  Pulmonary:     Effort: Pulmonary effort is normal.     Breath sounds: Normal breath sounds.  Abdominal:     General: Bowel sounds are normal.     Palpations: Abdomen is soft.  Musculoskeletal:     Cervical back: Normal range of motion.  Lymphadenopathy:     Cervical: No cervical adenopathy.  Skin:    General: Skin is warm and dry.     Capillary Refill: Capillary refill takes less than 2 seconds.  Neurological:     Mental Status: She is alert and oriented to person, place, and time.  Psychiatric:        Behavior: Behavior  normal.      Musculoskeletal Exam: She had limited range of motion of the cervical spine.  Thoracic kyphosis was noted.  She had discomfort but forward flexion of her right shoulder and some tenderness over anterior aspect of her shoulder.  She had no tenderness over subacromial region or the posterior glenohumeral aspect of the shoulder.  Left shoulder joint was in good range of motion.  Elbow joints in good range of motion.  She has thickening of the right wrist extensor tendon.  Left wrist joint was in good range of motion.  There was no synovitis over MCPs, PIPs.  PIP and DIP thickening with subluxation of several of her PIP and DIP joints was noted.  Hip joints in good range  of motion.  Both knee joints were replaced and were in good range of motion without discomfort.  There was no tenderness over ankles or MTPs.  CDAI Exam: CDAI Score: 2.4  Patient Global: 2 mm; Provider Global: 2 mm Swollen: 0 ; Tender: 2  Joint Exam 08/02/2022      Right  Left  Knee   Tender   Tender     Investigation: No additional findings.  Imaging: No results found.  Recent Labs: Lab Results  Component Value Date   WBC 5.5 03/08/2022   HGB 11.2 (L) 03/08/2022   PLT 210 03/08/2022   NA 141 03/08/2022   K 3.8 03/08/2022   CL 106 03/08/2022   CO2 25 03/08/2022   GLUCOSE 85 03/08/2022   BUN 13 03/08/2022   CREATININE 0.67 03/08/2022   BILITOT 0.4 03/08/2022   ALKPHOS 114 12/08/2021   AST 18 03/08/2022   ALT 12 03/08/2022   PROT 6.4 03/08/2022   ALBUMIN 4.2 12/08/2021   CALCIUM 9.7 03/08/2022   GFRAA 79 09/15/2020   QFTBGOLDPLUS NEGATIVE 10/11/2021    Speciality Comments: PLQ Eye Exam: 06/14/2022 WNL @ [facility] Follow up in   months [place] 02/01/22-04/05/22  Procedures:  No procedures performed Allergies: Lactose intolerance (gi); Antihistamines, chlorpheniramine-type; Other; and Tape   Assessment / Plan:     Visit Diagnoses: Rheumatoid arthritis of multiple sites with negative  rheumatoid factor (HCC) - +RF, +CCP: 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  Patient denies any recent episodes of joint swelling.  She states the swelling on her right extensor tendon has gone down.  She was placed on Enbrel in June 2023 which she discontinued in August 2023 as she started having anxiety with the injections.  She did fail on Enbrel as regards to joint pain and swelling.  We discussed different treatment options but at this point she is doing well and does not want to take any other medications.  High risk medication use - plaquenil 200 mg 1 tablet twice daily M-F.  Inadequate response to arava, d/c enbrel due to injection anxiety.  PLQ Eye Exam: 06/14/2022 - Plan: CBC with Differential/Platelet, COMPLETE METABOLIC PANEL WITH GFR today.  Information on immunization was placed in the AVS.  Acute pain of right shoulder-patient states she is been experiencing pain and discomfort in her right shoulder joint for a month.  She believes this is from lifting her grandson and also reaching out to the table lamp on her nightstand.  I offered x-rays  evaluation but she declined.  Use of topical Voltaren gel was discussed.  And if her symptoms get worse she will contact us.  A handout on shoulder joint exercises was also given.  Extensor tenosynovitis of right wrist-she did not have active tenosynovitis today.  She had thickening of the extensor tendons.  Primary osteoarthritis of both hands-she had no synovitis on the examination.  She had bilateral PIP and DIP thickening with subluxation of several of the PIP and DIP joints.  Joint protection muscle strengthening was discussed.  History of total right knee replacement-she had good range of motion.  She continues to have some discomfort in her knee joints.  History of total knee replacement, left-no warmth swelling of region was noted.  Patient complains of chronic discomfort in her knee joints.  Primary  osteoarthritis of both feet-no swelling or synovitis was noted over ankles or MTPs.  Proper fitting shoes were advised.  History of hyperlipidemia  History of hypertension-blood pressure  was normal at 123/69 today.  Osteoporosis screening-I do not see a DEXA scan in the chart.  Patient will discuss with her PCP if she had a recent DEXA scan.  Use of calcium rich diet and exercise was discussed.  Orders: Orders Placed This Encounter  Procedures   CBC with Differential/Platelet   COMPLETE METABOLIC PANEL WITH GFR   No orders of the defined types were placed in this encounter.    Follow-Up Instructions: Return in about 5 months (around 01/01/2023) for Rheumatoid arthritis, Osteoarthritis.   Bo Merino, MD  Note - This record has been created using Editor, commissioning.  Chart creation errors have been sought, but may not always  have been located. Such creation errors do not reflect on  the standard of medical care.

## 2022-07-24 ENCOUNTER — Other Ambulatory Visit: Payer: Self-pay | Admitting: Physician Assistant

## 2022-07-24 DIAGNOSIS — M0609 Rheumatoid arthritis without rheumatoid factor, multiple sites: Secondary | ICD-10-CM

## 2022-07-24 NOTE — Telephone Encounter (Signed)
Next Visit: 08/02/2022  Last Visit: 03/08/2022  Labs: 03/08/2022 CMP WNL. RBC count, hgb, and hct low consistent with anemia. MCV and MCH are slightly elevated concerning for macrocytic anemia.   Eye exam: 06/14/2022   Current Dose per office note on 03/08/2022: plaquenil 200 mg 1 tablet twice daily M-F.   OB:[id number] arthritis of multiple sites with negative rheumatoid factor   Last Fill: 05/03/2022  Okay to refill Plaquenil?

## 2022-08-02 ENCOUNTER — Encounter: Payer: Self-pay | Admitting: Rheumatology

## 2022-08-02 ENCOUNTER — Ambulatory Visit: Payer: Medicare Other | Attending: Rheumatology | Admitting: Rheumatology

## 2022-08-02 VITALS — BP 123/69 | HR 92 | Ht 59.0 in | Wt 156.6 lb

## 2022-08-02 DIAGNOSIS — M19042 Primary osteoarthritis, left hand: Secondary | ICD-10-CM

## 2022-08-02 DIAGNOSIS — M65831 Other synovitis and tenosynovitis, right forearm: Secondary | ICD-10-CM

## 2022-08-02 DIAGNOSIS — Z96651 Presence of right artificial knee joint: Secondary | ICD-10-CM

## 2022-08-02 DIAGNOSIS — M19071 Primary osteoarthritis, right ankle and foot: Secondary | ICD-10-CM

## 2022-08-02 DIAGNOSIS — Z79899 Other long term (current) drug therapy: Secondary | ICD-10-CM | POA: Diagnosis not present

## 2022-08-02 DIAGNOSIS — M0609 Rheumatoid arthritis without rheumatoid factor, multiple sites: Secondary | ICD-10-CM

## 2022-08-02 DIAGNOSIS — M25511 Pain in right shoulder: Secondary | ICD-10-CM | POA: Diagnosis not present

## 2022-08-02 DIAGNOSIS — Z8679 Personal history of other diseases of the circulatory system: Secondary | ICD-10-CM

## 2022-08-02 DIAGNOSIS — Z96652 Presence of left artificial knee joint: Secondary | ICD-10-CM

## 2022-08-02 DIAGNOSIS — Z8639 Personal history of other endocrine, nutritional and metabolic disease: Secondary | ICD-10-CM

## 2022-08-02 DIAGNOSIS — Z13820 Encounter for screening for osteoporosis: Secondary | ICD-10-CM

## 2022-08-02 DIAGNOSIS — M19041 Primary osteoarthritis, right hand: Secondary | ICD-10-CM

## 2022-08-02 DIAGNOSIS — M19072 Primary osteoarthritis, left ankle and foot: Secondary | ICD-10-CM

## 2022-08-02 NOTE — Patient Instructions (Signed)
Vaccines You are taking a medication(s) that can suppress your immune system.  The following immunizations are recommended: Flu annually Covid-19  Td/Tdap (tetanus, diphtheria, pertussis) every 10 years Pneumonia (Prevnar 15 then Pneumovax 23 at least 1 year apart.  Alternatively, can take Prevnar 20 without needing additional dose) Shingrix: 2 doses from 4 weeks to 6 months apart  Please check with your PCP to make sure you are up to date.   Shoulder Exercises Ask your health care provider which exercises are safe for you. Do exercises exactly as told by your health care provider and adjust them as directed. It is normal to feel mild stretching, pulling, tightness, or discomfort as you do these exercises. Stop right away if you feel sudden pain or your pain gets worse. Do not begin these exercises until told by your health care provider. Stretching exercises External rotation and abduction This exercise is sometimes called corner stretch. The exercise rotates your arm outward (external rotation) and moves your arm out from your body (abduction). Stand in a doorway with one of your feet slightly in front of the other. This is called a staggered stance. If you cannot reach your forearms to the door frame, stand facing a corner of a room. Choose one of the following positions as told by your health care provider: Place your hands and forearms on the door frame above your head. Place your hands and forearms on the door frame at the height of your head. Place your hands on the door frame at the height of your elbows. Slowly move your weight onto your front foot until you feel a stretch across your chest and in the front of your shoulders. Keep your head and chest upright and keep your abdominal muscles tight. Hold for __________ seconds. To release the stretch, shift your weight to your back foot. Repeat __________ times. Complete this exercise __________ times a day. Extension, standing  Stand  and hold a broomstick, a cane, or a similar object behind your back. Your hands should be a little wider than shoulder-width apart. Your palms should face away from your back. Keeping your elbows straight and your shoulder muscles relaxed, move the stick away from your body until you feel a stretch in your shoulders (extension). Avoid shrugging your shoulders while you move the stick. Keep your shoulder blades tucked down toward the middle of your back. Hold for __________ seconds. Slowly return to the starting position. Repeat __________ times. Complete this exercise __________ times a day. Range-of-motion exercises Pendulum  Stand near a wall or a surface that you can hold onto for balance. Bend at the waist and let your left / right arm hang straight down. Use your other arm to support you. Keep your back straight and do not lock your knees. Relax your left / right arm and shoulder muscles, and move your hips and your trunk so your left / right arm swings freely. Your arm should swing because of the motion of your body, not because you are using your arm or shoulder muscles. Keep moving your hips and trunk so your arm swings in the following directions, as told by your health care provider: Side to side. Forward and backward. In clockwise and counterclockwise circles. Continue each motion for __________ seconds, or for as long as told by your health care provider. Slowly return to the starting position. Repeat __________ times. Complete this exercise __________ times a day. Shoulder flexion, standing  Stand and hold a broomstick, a cane, or a  similar object. Place your hands a little more than shoulder-width apart on the object. Your left / right hand should be palm-up, and your other hand should be palm-down. Keep your elbow straight and your shoulder muscles relaxed. Push the stick up with your healthy arm to raise your left / right arm in front of your body, and then over your head  until you feel a stretch in your shoulder (flexion). Avoid shrugging your shoulder while you raise your arm. Keep your shoulder blade tucked down toward the middle of your back. Hold for __________ seconds. Slowly return to the starting position. Repeat __________ times. Complete this exercise __________ times a day. Shoulder abduction, standing  Stand and hold a broomstick, a cane, or a similar object. Place your hands a little more than shoulder-width apart on the object. Your left / right hand should be palm-up, and your other hand should be palm-down. Keep your elbow straight and your shoulder muscles relaxed. Push the object across your body toward your left / right side. Raise your left / right arm to the side of your body (abduction) until you feel a stretch in your shoulder. Do not raise your arm above shoulder height unless your health care provider tells you to do that. If directed, raise your arm over your head. Avoid shrugging your shoulder while you raise your arm. Keep your shoulder blade tucked down toward the middle of your back. Hold for __________ seconds. Slowly return to the starting position. Repeat __________ times. Complete this exercise __________ times a day. Internal rotation  Place your left / right hand behind your back, palm-up. Use your other hand to dangle an exercise band, a broomstick, or a similar object over your shoulder. Grasp the band with your left / right hand so you are holding on to both ends. Gently pull up on the band until you feel a stretch in the front of your left / right shoulder. The movement of your arm toward the center of your body is called internal rotation. Avoid shrugging your shoulder while you raise your arm. Keep your shoulder blade tucked down toward the middle of your back. Hold for __________ seconds. Release the stretch by letting go of the band and lowering your hands. Repeat __________ times. Complete this exercise __________  times a day. Strengthening exercises External rotation  Sit in a stable chair without armrests. Secure an exercise band to a stable object at elbow height on your left / right side. Place a soft object, such as a folded towel or a small pillow, between your left / right upper arm and your body to move your elbow about 4 inches (10 cm) away from your side. Hold the end of the exercise band so it is tight and there is no slack. Keeping your elbow pressed against the soft object, slowly move your forearm out, away from your abdomen (external rotation). Keep your body steady so only your forearm moves. Hold for __________ seconds. Slowly return to the starting position. Repeat __________ times. Complete this exercise __________ times a day. Shoulder abduction  Sit in a stable chair without armrests, or stand up. Hold a __________ lb / kg weight in your left / right hand, or hold an exercise band with both hands. Start with your arms straight down and your left / right palm facing in, toward your body. Slowly lift your left / right hand out to your side (abduction). Do not lift your hand above shoulder height unless your health  care provider tells you that this is safe. Keep your arms straight. Avoid shrugging your shoulder while you do this movement. Keep your shoulder blade tucked down toward the middle of your back. Hold for __________ seconds. Slowly lower your arm, and return to the starting position. Repeat __________ times. Complete this exercise __________ times a day. Shoulder extension  Sit in a stable chair without armrests, or stand up. Secure an exercise band to a stable object in front of you so it is at shoulder height. Hold one end of the exercise band in each hand. Straighten your elbows and lift your hands up to shoulder height. Squeeze your shoulder blades together as you pull your hands down to the sides of your thighs (extension). Stop when your hands are straight down by  your sides. Do not let your hands go behind your body. Hold for __________ seconds. Slowly return to the starting position. Repeat __________ times. Complete this exercise __________ times a day. Shoulder row  Sit in a stable chair without armrests, or stand up. Secure an exercise band to a stable object in front of you so it is at chest height. Hold one end of the exercise band in each hand. Position your palms so that your thumbs are facing the ceiling (neutral position). Bend each of your elbows to a 90-degree angle (right angle) and keep your upper arms at your sides. Step back or move the chair back until the band is tight and there is no slack. Slowly pull your elbows back behind you. Hold for __________ seconds. Slowly return to the starting position. Repeat __________ times. Complete this exercise __________ times a day. Shoulder press-ups  Sit in a stable chair that has armrests. Sit upright, with your feet flat on the floor. Put your hands on the armrests so your elbows are bent and your fingers are pointing forward. Your hands should be about even with the sides of your body. Push down on the armrests and use your arms to lift yourself off the chair. Straighten your elbows and lift yourself up as much as you comfortably can. Move your shoulder blades down, and avoid letting your shoulders move up toward your ears. Keep your feet on the ground. As you get stronger, your feet should support less of your body weight as you lift yourself up. Hold for __________ seconds. Slowly lower yourself back into the chair. Repeat __________ times. Complete this exercise __________ times a day. Wall push-ups  Stand so you are facing a stable wall. Your feet should be about one arm-length away from the wall. Lean forward and place your palms on the wall at shoulder height. Keep your feet flat on the floor as you bend your elbows and lean forward toward the wall. Hold for __________  seconds. Straighten your elbows to push yourself back to the starting position. Repeat __________ times. Complete this exercise __________ times a day. This information is not intended to replace advice given to you by your health care provider. Make sure you discuss any questions you have with your health care provider. Document Revised: 09/20/2021 Document Reviewed: 09/20/2021 Elsevier Patient Education  [place].

## 2022-08-03 LAB — CBC WITH DIFFERENTIAL/PLATELET
Absolute Monocytes: 876 cells/uL (ref 200–950)
Basophils Absolute: 41 cells/uL (ref 0–200)
Basophils Relative: 0.6 %
Eosinophils Absolute: 269 cells/uL (ref 15–500)
Eosinophils Relative: 3.9 %
HCT: 37.5 % (ref 35.0–45.0)
Hemoglobin: 12.6 g/dL (ref 11.7–15.5)
Lymphs Abs: 1725 cells/uL (ref 850–3900)
MCH: 31.3 pg (ref 27.0–33.0)
MCHC: 33.6 g/dL (ref 32.0–36.0)
MCV: 93.1 fL (ref 80.0–100.0)
MPV: 10.1 fL (ref 7.5–12.5)
Monocytes Relative: 12.7 %
Neutro Abs: 3988 cells/uL (ref 1500–7800)
Neutrophils Relative %: 57.8 %
Platelets: 241 10*3/uL (ref 140–400)
RBC: 4.03 10*6/uL (ref 3.80–5.10)
RDW: 12.6 % (ref 11.0–15.0)
Total Lymphocyte: 25 %
WBC: 6.9 10*3/uL (ref 3.8–10.8)

## 2022-08-03 LAB — COMPLETE METABOLIC PANEL WITH GFR
AG Ratio: 1.5 (calc) (ref 1.0–2.5)
ALT: 11 U/L (ref 6–29)
AST: 17 U/L (ref 10–35)
Albumin: 4.1 g/dL (ref 3.6–5.1)
Alkaline phosphatase (APISO): 78 U/L (ref 37–153)
BUN: 15 mg/dL (ref 7–25)
CO2: 27 mmol/L (ref 20–32)
Calcium: 9.8 mg/dL (ref 8.6–10.4)
Chloride: 101 mmol/L (ref 98–110)
Creat: 0.76 mg/dL (ref 0.60–0.95)
Globulin: 2.7 g/dL (calc) (ref 1.9–3.7)
Glucose, Bld: 94 mg/dL (ref 65–99)
Potassium: 3.7 mmol/L (ref 3.5–5.3)
Sodium: 137 mmol/L (ref 135–146)
Total Bilirubin: 0.3 mg/dL (ref 0.2–1.2)
Total Protein: 6.8 g/dL (ref 6.1–8.1)
eGFR: 79 mL/min/{1.73_m2} (ref >=60)

## 2022-08-03 NOTE — Progress Notes (Signed)
CBC and CMP normal

## 2022-10-03 ENCOUNTER — Telehealth: Payer: Self-pay

## 2022-10-03 NOTE — Telephone Encounter (Signed)
I received a phone call from Dr. Nyra Capes.  She stated that patient is having a flare of rheumatoid arthritis and patient wants to go back on the Biologics.  She does not like to administer injections herself but she can get injections at Dr. Red Christians office.  She would like the option of an injection which can be given less frequently due to transportation issues.  I discussed the option of starting her on Simponi or Cimzia injections which only once a month.  Patient can receive that at Dr. Red Christians office.  We will apply for Simponi or Cimzia once approved we will schedule an appointment to administer first injection in the office.  Patient will also need an office visit at the same time.

## 2022-10-03 NOTE — Telephone Encounter (Signed)
We will apply for Simponi SQ.  Dose: 69m SQ once monthly  DKnox Saliva PharmD, MPH, BCPS, CPP Clinical Pharmacist (Rheumatology and Pulmonology)

## 2022-10-03 NOTE — Telephone Encounter (Signed)
Received a call from [facility] family practice about this patient to discuss Enbrel medication. Dr. Marco Collie can be reached on her cell phone at [phone].

## 2022-10-09 ENCOUNTER — Other Ambulatory Visit (HOSPITAL_COMMUNITY): Payer: Self-pay

## 2022-10-09 ENCOUNTER — Telehealth: Payer: Self-pay

## 2022-10-09 NOTE — Telephone Encounter (Signed)
Received request to begin Simponi BIV. Pt previously failed Enbrel.  Submitted a Prior Authorization request to [facility] for [facility] via CoverMyMeds. Will update once we receive a response.  Key: [id number]

## 2022-10-09 NOTE — Telephone Encounter (Signed)
Specialty pharmacy called in wanting medication verification. Please return call. Thanks.

## 2022-10-11 ENCOUNTER — Telehealth: Payer: Self-pay | Admitting: Pharmacist

## 2022-10-11 ENCOUNTER — Other Ambulatory Visit (HOSPITAL_COMMUNITY): Payer: Self-pay

## 2022-10-11 NOTE — Telephone Encounter (Signed)
Ok, thank you

## 2022-10-11 NOTE — Telephone Encounter (Addendum)
Received a fax regarding Prior Authorization from [facility] for [facility]. Authorization has been DENIED because:  SIMPONI INJ 50/0.5ML is not FDA approved or supported by an accepted reference for your medical condition(s): Moderately to severely active rheumatoid arthritis. In order to approve your medication, you must also be using the medication in combination with methotrexate. Therefore, your drug is denied because it is not being used for a "medically accepted indication".  Phone# [phone]

## 2022-10-11 NOTE — Telephone Encounter (Addendum)
If patient is confident that her PCP Dr. Nyra Capes' office can administer medication, then we can try for Cimzia. It is not FDA-approved only in combination with MTX use.  She will have a loading dose with medication: 400 mg SQ at Week 0, Week 2, Week 4 then '400mg'$  SQ every 4 weeks.  Knox Saliva, PharmD, MPH, BCPS, CPP Clinical Pharmacist (Rheumatology and Pulmonology)

## 2022-10-11 NOTE — Telephone Encounter (Signed)
Submitted a Prior Authorization request to [facility] for CIMZIA via CoverMyMeds. Will update once we receive a response.  Key: [id number]  Will likely be denied as it is not preferred option  Knox Saliva, PharmD, MPH, BCPS, CPP Clinical Pharmacist (Rheumatology and Pulmonology)

## 2022-10-20 ENCOUNTER — Other Ambulatory Visit (HOSPITAL_COMMUNITY): Payer: Self-pay

## 2022-10-20 NOTE — Telephone Encounter (Signed)
Received notification from [facility] regarding a prior authorization for [facility]. Authorization has been APPROVED from 10/11/22 to 04/11/23. Approval letter sent to scan center.  Per test claim, copay for both the starter kit and maintenance kit is $0  Patient can fill through [place]: [phone]   Authorization # [id number]  Knox Saliva, PharmD, MPH, BCPS, CPP Clinical Pharmacist (Rheumatology and Pulmonology)

## 2022-11-01 NOTE — Telephone Encounter (Signed)
Received return call from [facility] practice. Discussed Cimzia dosing and labwork. Advised of PFS formulation and that it is two injections with each full dose. Advised that pharmacy would reach out month to month to schedule shipment to their clinic. She states they would be able to scheduled nursing visits for pt.  Contact info for [facility]: [phone], ext [phone] or ext [phone]  Hours: M-F: 730am-5:45pm, Wednesdays closed from 12-2pm, Fridays closed at 5pm  Patient has OV w Dr. Estanislado Pandy on 11/16/2022 to finalize consent/counseling for Clayburn Pert, PharmD, MPH, BCPS, CPP Clinical Pharmacist (Rheumatology and Pulmonology)

## 2022-11-01 NOTE — Telephone Encounter (Signed)
Called patient to discuss Cimzia. I reviewed dosing of Cimzia at week 0, week 2, week 4, then every 4 weeks.  She states that she'd like to further discuss at [place] on 11/16/2022 with Dr. Estanislado Pandy.  Will need to get consent at [facility] on 11/16/2022.   I called Dr. Nyra Capes office to confirm that they are in fact able to administering her Cimzia. Message has been left with nursing team  Knox Saliva, PharmD, MPH, BCPS, CPP Clinical Pharmacist (Rheumatology and Pulmonology)

## 2022-11-07 NOTE — Progress Notes (Deleted)
Office Visit Note  Patient: Ann Gamble             Date of Birth: [birth year]           MRN: [id number]             PCP: Maryella Shivers, MD Referring: Maryella Shivers, MD Visit Date: 11/16/2022 Occupation: @GUAROCC @  Subjective:  No chief complaint on file.   History of Present Illness: Ann Gamble is a 82 y.o. female ***     Activities of Daily Living:  Patient reports morning stiffness for *** {minute/hour:19697}.   Patient {ACTIONS;DENIES/REPORTS:21021675::"Denies"} nocturnal pain.  Difficulty dressing/grooming: {ACTIONS;DENIES/REPORTS:21021675::"Denies"} Difficulty climbing stairs: {ACTIONS;DENIES/REPORTS:21021675::"Denies"} Difficulty getting out of chair: {ACTIONS;DENIES/REPORTS:21021675::"Denies"} Difficulty using hands for taps, buttons, cutlery, and/or writing: {ACTIONS;DENIES/REPORTS:21021675::"Denies"}  No Rheumatology ROS completed.   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 factor ([place]) 09/04/2016   High risk medication use 09/04/2016   Primary osteoarthritis of right knee 09/04/2016   History of total knee replacement, left 09/04/2016   Primary osteoarthritis of both hands 09/04/2016   Primary osteoarthritis of both feet 09/04/2016   Essential hypertension 09/04/2016   Dyslipidemia 09/04/2016    Past Medical History:  Diagnosis Date   Allergy    Anemia    Bilateral carpal tunnel syndrome    Cataract immature    right   Chronic gastritis    Constipation    takes stool softener nightly   Foot deformity, bilateral    GERD (gastroesophageal reflux disease)    Hx of colonic polyps    Hyperlipidemia    takes Simvastatin daily   Hypertension    takes Hyzaar and Amlodipine daily   IPMN (intraductal papillary mucinous neoplasm)    1.2cm on CT 11-28-2016   OA (osteoarthritis)    Osteoporosis    RA  (rheumatoid arthritis) (HCC)    Seasonal allergies    Sensitive skin    pt states she has highly sensitive skin   Urinary incontinence     Family History  Problem Relation Age of Onset   Thyroid disease Mother    Alzheimer's disease Mother    Stroke Brother    Diabetes Son    Anesthesia problems Neg Hx    Hypotension Neg Hx    Pseudochol deficiency Neg Hx    Malignant hyperthermia Neg Hx    Colon cancer Neg Hx    Rectal cancer Neg Hx    Stomach cancer Neg Hx    Past Surgical History:  Procedure Laterality Date   CATARACT EXTRACTION Bilateral    COLONOSCOPY  11/20/2016   Mild sigmoid diverticulosis. Otherwise normal colonoscopy to terminal ileum   DENTAL SURGERY     ESOPHAGOGASTRODUODENOSCOPY  11/06/2016   Transient schatzki's ring status post esophageal dilatation. Small hiatal hernia. 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   Procedure: TOTAL KNEE ARTHROPLASTY;  Surgeon: Kerin Salen, MD;  Location: [facility];  Service: Orthopedics;  Laterality: Left;  DEPUY/SIGMA   TOTAL KNEE ARTHROPLASTY Right 08/05/2018   Procedure: TOTAL KNEE ARTHROPLASTY;  Surgeon: Vickey Huger, MD;  Location: [facility];  Service: Orthopedics;  Laterality: Right;   VAGINAL HYSTERECTOMY  1970's   partial   Social History   Social History Narrative   Not on file   Immunization History  Administered Date(s) Administered   Moderna Sars-Covid-2 Vaccination 09/18/2019, 10/14/2019,  07/19/2020     Objective: Vital Signs: There were no vitals taken for this visit.   Physical Exam   Musculoskeletal Exam: ***  CDAI Exam: CDAI Score: -- Patient Global: --; Provider Global: -- Swollen: --; Tender: -- Joint Exam 11/16/2022   No joint exam has been documented for this visit   There is currently no information documented on the homunculus. Go to the Rheumatology activity and complete the homunculus joint exam.  Investigation: No additional findings.  Imaging: No  results found.  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  No orders of the defined types were placed in this encounter.   Face-to-face time spent with patient was *** minutes. Greater than 50% of time was spent in counseling and coordination of care.  Follow-Up Instructions: No follow-ups on file.   Ofilia Neas, PA-C  Note - This record has been created using Dragon software.  Chart creation errors have been sought, but may not always  have been located. Such creation errors do not reflect on  the standard of medical care.

## 2022-11-16 ENCOUNTER — Ambulatory Visit: Payer: Medicare Other | Admitting: Rheumatology

## 2022-11-16 DIAGNOSIS — M19041 Primary osteoarthritis, right hand: Secondary | ICD-10-CM

## 2022-11-16 DIAGNOSIS — Z8639 Personal history of other endocrine, nutritional and metabolic disease: Secondary | ICD-10-CM

## 2022-11-16 DIAGNOSIS — M65831 Other synovitis and tenosynovitis, right forearm: Secondary | ICD-10-CM

## 2022-11-16 DIAGNOSIS — M19071 Primary osteoarthritis, right ankle and foot: Secondary | ICD-10-CM

## 2022-11-16 DIAGNOSIS — Z96652 Presence of left artificial knee joint: Secondary | ICD-10-CM

## 2022-11-16 DIAGNOSIS — M0609 Rheumatoid arthritis without rheumatoid factor, multiple sites: Secondary | ICD-10-CM

## 2022-11-16 DIAGNOSIS — Z79899 Other long term (current) drug therapy: Secondary | ICD-10-CM

## 2022-11-16 DIAGNOSIS — Z8679 Personal history of other diseases of the circulatory system: Secondary | ICD-10-CM

## 2022-11-16 DIAGNOSIS — Z96651 Presence of right artificial knee joint: Secondary | ICD-10-CM

## 2022-11-16 NOTE — Telephone Encounter (Signed)
Patient cancelled appt today. She'll call back to r/s appt once transportation is secured. Closing enconuter for now and will wait for f/u pending next OV  Knox Saliva, PharmD, MPH, BCPS, CPP Clinical Pharmacist (Rheumatology and Pulmonology)

## 2022-11-22 ENCOUNTER — Other Ambulatory Visit: Payer: Self-pay | Admitting: Physician Assistant

## 2022-11-22 DIAGNOSIS — M0609 Rheumatoid arthritis without rheumatoid factor, multiple sites: Secondary | ICD-10-CM

## 2022-11-22 NOTE — Telephone Encounter (Signed)
Last Fill: 07/24/2022  Eye exam: 06/14/2022 WNL    Labs: 08/02/2022 CBC and CMP normal.   Next Visit: Due May 2024. Message sent to the front to schedule.   Last Visit: 08/02/2022  DX: Rheumatoid arthritis of multiple sites with negative rheumatoid factor   Current Dose per office note 08/02/2022: plaquenil 200 mg 1 tablet twice daily M-F   Okay to refill Plaquenil?

## 2022-11-22 NOTE — Telephone Encounter (Signed)
Please schedule patient a follow up visit. Patient due May 2024. Thanks!  

## 2022-11-22 NOTE — Telephone Encounter (Signed)
LMOM to schedule follow-up appointment. °

## 2022-11-30 NOTE — Progress Notes (Signed)
Office Visit Note  Patient: Ann Gamble             Date of Birth: [birth year]           MRN: [id number]             PCP: Charlott Rakes, MD Referring: Charlott Rakes, MD Visit Date: 12/14/2022 Occupation: @GUAROCC @  Subjective:  Right shoulder pain  History of Present Illness: Ann Gamble is a 82 y.o. female with history of rheumatoid arthritis, and osteoarthritis.  She continues to take hydroxychloroquine on a regular basis.  She states she has been having pain and discomfort in her right shoulder joint the last several months.  She has difficulty raising her right arm.  She has been followed by Dr. Dimple Casey (pain management) in [place].  He recently diagnosed her with neuropathy and placed on Elavil which has been helpful.  She continues to have some discomfort in her knee joints due to osteoarthritis.  Her knee joints are replaced.    Activities of Daily Living:  Patient reports morning stiffness for 1  hour.   Patient Denies nocturnal pain.  Difficulty dressing/grooming: Denies Difficulty climbing stairs: Denies Difficulty getting out of chair: Denies Difficulty using hands for taps, buttons, cutlery, and/or writing: Reports  Review of Systems  Constitutional:  Positive for fatigue.  HENT:  Positive for mouth dryness. Negative for mouth sores.   Eyes:  Positive for dryness.  Respiratory:  Negative for apnea and difficulty breathing.   Cardiovascular:  Positive for swelling in legs/feet. Negative for chest pain and palpitations.  Gastrointestinal:  Negative for blood in stool, constipation and diarrhea.  Endocrine: Negative for increased urination.  Genitourinary:  Negative for involuntary urination.  Musculoskeletal:  Positive for joint pain, joint pain, joint swelling, myalgias, morning stiffness and myalgias. Negative for gait problem, muscle weakness and muscle tenderness.  Skin:  Positive for sensitivity to sunlight. Negative for color change, rash and hair  loss.  Allergic/Immunologic: Negative for susceptible to infections.  Neurological:  Negative for dizziness and headaches.  Hematological:  Negative for swollen glands.  Psychiatric/Behavioral:  Negative for depressed mood and sleep disturbance. The patient is not nervous/anxious.     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 factor (HCC) 09/04/2016   High risk medication use 09/04/2016   Primary osteoarthritis of right knee 09/04/2016   History of total knee replacement, left 09/04/2016   Primary osteoarthritis of both hands 09/04/2016   Primary osteoarthritis of both feet 09/04/2016   Essential hypertension 09/04/2016   Dyslipidemia 09/04/2016    Past Medical History:  Diagnosis Date   Allergy    Anemia    Bilateral carpal tunnel syndrome    Cataract immature    right   Chronic gastritis    Constipation    takes stool softener nightly   Foot deformity, bilateral    GERD (gastroesophageal reflux disease)    Hx of colonic polyps    Hyperlipidemia    takes Simvastatin daily   Hypertension    takes Hyzaar and Amlodipine daily   IPMN (intraductal papillary mucinous neoplasm)    1.2cm on CT 11-28-2016   Neuropathy    per patient   OA (osteoarthritis)    Osteoporosis    RA (rheumatoid arthritis) (HCC)    Seasonal allergies    Sensitive skin    pt states she has highly  sensitive skin   Urinary incontinence     Family History  Problem Relation Age of Onset   Thyroid disease Mother    Alzheimer's disease Mother    Stroke Brother    Diabetes Son    Anesthesia problems Neg Hx    Hypotension Neg Hx    Pseudochol deficiency Neg Hx    Malignant hyperthermia Neg Hx    Colon cancer Neg Hx    Rectal cancer Neg Hx    Stomach cancer Neg Hx    Past Surgical History:  Procedure Laterality Date   CATARACT EXTRACTION Bilateral    COLONOSCOPY   11/20/2016   Mild sigmoid diverticulosis. Otherwise normal colonoscopy to terminal ileum   DENTAL SURGERY     ESOPHAGOGASTRODUODENOSCOPY  11/06/2016   Transient schatzki's ring status post esophageal dilatation. Small hiatal hernia. 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   Procedure: TOTAL KNEE ARTHROPLASTY;  Surgeon: Nestor Lewandowsky, MD;  Location: [facility];  Service: Orthopedics;  Laterality: Left;  DEPUY/SIGMA   TOTAL KNEE ARTHROPLASTY Right 08/05/2018   Procedure: TOTAL KNEE ARTHROPLASTY;  Surgeon: Dannielle Huh, MD;  Location: [facility];  Service: Orthopedics;  Laterality: Right;   VAGINAL HYSTERECTOMY  1970's   partial   Social History   Social History Narrative   Not on file   Immunization History  Administered Date(s) Administered   Moderna Sars-Covid-2 Vaccination 09/18/2019, 10/14/2019, 07/19/2020     Objective: Vital Signs: BP 107/65 (BP Location: Left Arm, Patient Position: Sitting, Cuff Size: Normal)   Pulse 97   Resp 15   Ht 4\' 11"  (1.499 m)   Wt 146 lb 3.2 oz (66.3 kg)   BMI 29.53 kg/m    Physical Exam Vitals and nursing note reviewed.  Constitutional:      Appearance: She is well-developed.  HENT:     Head: Normocephalic and atraumatic.  Eyes:     Conjunctiva/sclera: Conjunctivae normal.  Cardiovascular:     Rate and Rhythm: Normal rate and regular rhythm.     Heart sounds: Normal heart sounds.  Pulmonary:     Effort: Pulmonary effort is normal.     Breath sounds: Normal breath sounds.  Abdominal:     General: Bowel sounds are normal.     Palpations: Abdomen is soft.  Musculoskeletal:     Cervical back: Normal range of motion.  Lymphadenopathy:     Cervical: No cervical adenopathy.  Skin:    General: Skin is warm and dry.     Capillary Refill: Capillary refill takes less than 2 seconds.  Neurological:     Mental Status: She is alert and oriented to person, place, and time.  Psychiatric:        Behavior: Behavior  normal.      Musculoskeletal Exam: Patient had limited lateral rotation of the cervical spine.  Thoracic kyphosis was noted.  She had no tenderness over thoracic or lumbar spine.  She had right shoulder joint abduction limited to about 70 degrees and forward flexion about 90 degrees.  She had limited internal rotation.  Left shoulder joint was in full range of motion.  Elbow joints and wrist joints with good range of motion.  Bilateral MCP thickening was noted.  No synovitis was noted.  PIP and DIP thickening was noted.  She is unable to extend her right fourth and fifth fingers.  Hip joints were difficult to assess in the sitting position.  Knee joints were replaced and were in  good range of motion.  There was no tenderness over ankles or MTPs.  CDAI Exam: CDAI Score: -- Patient Global: 2 mm; Provider Global: 2 mm Swollen: --; Tender: -- Joint Exam 12/14/2022   No joint exam has been documented for this visit   There is currently no information documented on the homunculus. Go to the Rheumatology activity and complete the homunculus joint exam.  Investigation: No additional findings.  Imaging: No results found.  Recent Labs: Lab Results  Component Value Date   WBC 6.9 08/02/2022   HGB 12.6 08/02/2022   PLT 241 08/02/2022   NA 137 08/02/2022   K 3.7 08/02/2022   CL 101 08/02/2022   CO2 27 08/02/2022   GLUCOSE 94 08/02/2022   BUN 15 08/02/2022   CREATININE 0.76 08/02/2022   BILITOT 0.3 08/02/2022   ALKPHOS 114 12/08/2021   AST 17 08/02/2022   ALT 11 08/02/2022   PROT 6.8 08/02/2022   ALBUMIN 4.2 12/08/2021   CALCIUM 9.8 08/02/2022   GFRAA 79 09/15/2020   QFTBGOLDPLUS NEGATIVE 10/11/2021    Speciality Comments: PLQ Eye Exam: 06/14/2022 WNL @ [facility] Follow up in   months E[facility]re 02/01/22-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 Consent was given by the patient. Immediately prior to procedure a time out was called to verify the correct patient, procedure, equipment, support staff and site/side marked as required. Patient was prepped and draped in the usual sterile fashion.     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  Patient denies any joint swelling.  She has been experiencing pain and discomfort in her right shoulder for the last few months.  She is unable to raise her right arm and had limited range of motion today.  No synovitis was noted on the examination.  High risk medication use - plaquenil 200 mg 1 tablet twice daily M-F.  Inadequate response to arava, d/c enbrel due to injection anxiety.  PLQ Eye Exam: 06/14/2022 - Plan: CBC with Differential/Platelet, COMPLETE METABOLIC PANEL WITH GFR today and then every 5 months.  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  Plan: XR Shoulder Right.  X-rays of the right shoulder joint were unremarkable.  X-ray findings were discussed with the patient.  After informed consent was obtained and different treatment options were discussed right shoulder joint was injected with 1-1/2 mL of 1% lidocaine and 40 mg of Kenalog as described above.  Patient tolerated the procedure well.  Postprocedure instructions were given.  A handout on shoulder joint exercises was given.  Extensor tenosynovitis of right wrist-resolved  Primary osteoarthritis of both hands-she had bilateral CMC PIP  and DIP thickening.  She had discomfort in her bilateral hands.  Joint protection muscle strengthening was discussed.  She has not limited extension of the right fourth and fifth fingers.  History of total right knee replacement-she had good range of motion.  History of total knee replacement, left-she complains of chronic discomfort in her knee joints.  She  goes to pain management.  Primary osteoarthritis of both feet-proper fitting shoes were advised.  Osteoporosis screening -patient scheduled to get a DEXA scan.  History of hypertension-blood pressure was 107/65.  History of hyperlipidemia  History of peripheral neuropathy-recent diagnosis.  Patient states that she has noted improvement on the level.  Orders: Orders Placed This Encounter  Procedures   XR Shoulder Right   CBC with Differential/Platelet   COMPLETE METABOLIC PANEL WITH GFR   No orders of the defined types were placed in this encounter.   Follow-Up Instructions: Return in about 5 months (around 05/16/2023) for Rheumatoid arthritis, Osteoarthritis.   Pollyann Savoy, MD  Note - This record has been created using Animal nutritionist.  Chart creation errors have been sought, but may not always  have been located. Such creation errors do not reflect on  the standard of medical care.

## 2022-12-14 ENCOUNTER — Ambulatory Visit (INDEPENDENT_AMBULATORY_CARE_PROVIDER_SITE_OTHER): Payer: 59

## 2022-12-14 ENCOUNTER — Encounter: Payer: Self-pay | Admitting: Rheumatology

## 2022-12-14 ENCOUNTER — Ambulatory Visit: Payer: 59 | Attending: Rheumatology | Admitting: Rheumatology

## 2022-12-14 VITALS — BP 107/65 | HR 97 | Resp 15 | Ht 59.0 in | Wt 146.2 lb

## 2022-12-14 DIAGNOSIS — M25511 Pain in right shoulder: Secondary | ICD-10-CM | POA: Diagnosis not present

## 2022-12-14 DIAGNOSIS — Z13820 Encounter for screening for osteoporosis: Secondary | ICD-10-CM

## 2022-12-14 DIAGNOSIS — M19042 Primary osteoarthritis, left hand: Secondary | ICD-10-CM

## 2022-12-14 DIAGNOSIS — Z96651 Presence of right artificial knee joint: Secondary | ICD-10-CM

## 2022-12-14 DIAGNOSIS — G8929 Other chronic pain: Secondary | ICD-10-CM | POA: Diagnosis not present

## 2022-12-14 DIAGNOSIS — Z79899 Other long term (current) drug therapy: Secondary | ICD-10-CM | POA: Diagnosis not present

## 2022-12-14 DIAGNOSIS — M19071 Primary osteoarthritis, right ankle and foot: Secondary | ICD-10-CM

## 2022-12-14 DIAGNOSIS — M0609 Rheumatoid arthritis without rheumatoid factor, multiple sites: Secondary | ICD-10-CM

## 2022-12-14 DIAGNOSIS — M65831 Other synovitis and tenosynovitis, right forearm: Secondary | ICD-10-CM

## 2022-12-14 DIAGNOSIS — Z8639 Personal history of other endocrine, nutritional and metabolic disease: Secondary | ICD-10-CM

## 2022-12-14 DIAGNOSIS — Z8669 Personal history of other diseases of the nervous system and sense organs: Secondary | ICD-10-CM

## 2022-12-14 DIAGNOSIS — Z8679 Personal history of other diseases of the circulatory system: Secondary | ICD-10-CM

## 2022-12-14 DIAGNOSIS — M19041 Primary osteoarthritis, right hand: Secondary | ICD-10-CM

## 2022-12-14 DIAGNOSIS — M19072 Primary osteoarthritis, left ankle and foot: Secondary | ICD-10-CM

## 2022-12-14 DIAGNOSIS — Z96652 Presence of left artificial knee joint: Secondary | ICD-10-CM

## 2022-12-14 LAB — CBC WITH DIFFERENTIAL/PLATELET
Absolute Monocytes: 769 cells/uL (ref 200–950)
Basophils Absolute: 38 cells/uL (ref 0–200)
HCT: 36 % (ref 35.0–45.0)
MCH: 30.2 pg (ref 27.0–33.0)
Neutrophils Relative %: 52.3 %
RBC: 3.88 10*6/uL (ref 3.80–5.10)
RDW: 12.6 % (ref 11.0–15.0)
Total Lymphocyte: 29.8 %

## 2022-12-14 MED ORDER — TRIAMCINOLONE ACETONIDE 40 MG/ML IJ SUSP
40.0000 mg | INTRAMUSCULAR | Status: AC | PRN
Start: 2022-12-14 — End: 2022-12-14
  Administered 2022-12-14: 40 mg via INTRA_ARTICULAR

## 2022-12-14 MED ORDER — LIDOCAINE HCL 1 % IJ SOLN
1.5000 mL | INTRAMUSCULAR | Status: AC | PRN
Start: 2022-12-14 — End: 2022-12-14
  Administered 2022-12-14: 1.5 mL

## 2022-12-14 NOTE — Patient Instructions (Signed)

## 2022-12-15 LAB — COMPLETE METABOLIC PANEL WITH GFR
AG Ratio: 1 (calc) (ref 1.0–2.5)
ALT: 9 U/L (ref 6–29)
AST: 17 U/L (ref 10–35)
Albumin: 3.7 g/dL (ref 3.6–5.1)
Alkaline phosphatase (APISO): 93 U/L (ref 37–153)
BUN: 10 mg/dL (ref 7–25)
CO2: 28 mmol/L (ref 20–32)
Calcium: 9.8 mg/dL (ref 8.6–10.4)
Chloride: 99 mmol/L (ref 98–110)
Creat: 0.73 mg/dL (ref 0.60–0.95)
Globulin: 3.8 g/dL (calc) — ABNORMAL HIGH (ref 1.9–3.7)
Glucose, Bld: 101 mg/dL — ABNORMAL HIGH (ref 65–99)
Potassium: 4.2 mmol/L (ref 3.5–5.3)
Sodium: 136 mmol/L (ref 135–146)
Total Bilirubin: 0.3 mg/dL (ref 0.2–1.2)
Total Protein: 7.5 g/dL (ref 6.1–8.1)
eGFR: 83 mL/min/{1.73_m2} (ref >=60)

## 2022-12-15 LAB — CBC WITH DIFFERENTIAL/PLATELET
Basophils Relative: 0.6 %
Eosinophils Absolute: 321 cells/uL (ref 15–500)
Eosinophils Relative: 5.1 %
Hemoglobin: 11.7 g/dL (ref 11.7–15.5)
Lymphs Abs: 1877 cells/uL (ref 850–3900)
MCHC: 32.5 g/dL (ref 32.0–36.0)
MCV: 92.8 fL (ref 80.0–100.0)
MPV: 9.7 fL (ref 7.5–12.5)
Monocytes Relative: 12.2 %
Neutro Abs: 3295 cells/uL (ref 1500–7800)
Platelets: 340 10*3/uL (ref 140–400)
WBC: 6.3 10*3/uL (ref 3.8–10.8)

## 2022-12-15 NOTE — Progress Notes (Signed)
CBC and CMP normal

## 2023-01-13 DIAGNOSIS — I361 Nonrheumatic tricuspid (valve) insufficiency: Secondary | ICD-10-CM | POA: Diagnosis not present

## 2023-01-13 DIAGNOSIS — I272 Pulmonary hypertension, unspecified: Secondary | ICD-10-CM | POA: Diagnosis not present

## 2023-01-16 ENCOUNTER — Ambulatory Visit: Payer: Medicare Other | Admitting: Rheumatology

## 2023-02-16 ENCOUNTER — Other Ambulatory Visit: Payer: Self-pay | Admitting: Physician Assistant

## 2023-02-16 DIAGNOSIS — M0609 Rheumatoid arthritis without rheumatoid factor, multiple sites: Secondary | ICD-10-CM

## 2023-02-16 NOTE — Telephone Encounter (Signed)
Last Fill: 11/22/2022  Eye exam: 06/14/2022   Labs: 12/14/2022 CBC and CMP normal.   Next Visit: 05/22/2023  Last Visit: 12/14/2022  UJ:[id number] arthritis of multiple sites with negative rheumatoid factor   Current Dose per office note on 12/14/2022: plaquenil 200 mg 1 tablet twice daily M-F.   Okay to refill Plaquenil?

## 2023-05-08 NOTE — Progress Notes (Signed)
Office Visit Note  Patient: Ann Gamble             Date of Birth: January 15, 1941           MRN: [id number]             PCP: Charlott Rakes, MD Referring: Charlott Rakes, MD Visit Date: 05/22/2023 Occupation: @GUAROCC @  Subjective:  Medication monitoring   History of Present Illness: Ann Gamble is a 82 y.o. female with history of seronegative rheumatoid arthritis. She is taking plaquenil 200 mg 1 tablet by mouth twice daily Monday through Friday.   She is tolerating Plaquenil without any side effects and has not missed any doses recently.  Patient states that she was hospitalized at the end of May 2024 with pneumonia.  Patient states that she has been tracking her pulse ox and uses oxygen as needed.  She denies any other recent or recurrent infections.  Patient states she has an upcoming appointment with pain management.  She continues to have interval discomfort in her right shoulder.  She had a right shoulder injection in the past which provided no relief.  She denies any joint swelling at this time.   Activities of Daily Living:  Patient reports morning stiffness for several hours.   Patient Reports nocturnal pain.  Difficulty dressing/grooming: Reports Difficulty climbing stairs: Denies Difficulty getting out of chair: Denies Difficulty using hands for taps, buttons, cutlery, and/or writing: Reports  Review of Systems  Constitutional:  Positive for fatigue.  HENT:  Positive for mouth dryness. Negative for mouth sores.   Eyes:  Negative for dryness.  Cardiovascular:  Negative for chest pain and palpitations.  Gastrointestinal:  Negative for blood in stool, constipation and diarrhea.  Endocrine: Negative for increased urination.  Genitourinary:  Negative for involuntary urination.  Musculoskeletal:  Positive for joint pain, gait problem, joint pain, myalgias, morning stiffness, muscle tenderness and myalgias. Negative for joint swelling and muscle weakness.  Skin:   Positive for color change and sensitivity to sunlight. Negative for rash and hair loss.  Allergic/Immunologic: Negative for susceptible to infections.  Neurological:  Positive for numbness and headaches. Negative for dizziness.  Hematological:  Negative for swollen glands.  Psychiatric/Behavioral:  Positive for sleep disturbance. Negative for depressed mood. The patient is not nervous/anxious.     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 factor (HCC) 09/04/2016   High risk medication use 09/04/2016   Primary osteoarthritis of right knee 09/04/2016   History of total knee replacement, left 09/04/2016   Primary osteoarthritis of both hands 09/04/2016   Primary osteoarthritis of both feet 09/04/2016   Essential hypertension 09/04/2016   Dyslipidemia 09/04/2016    Past Medical History:  Diagnosis Date   Allergy    Anemia    Bilateral carpal tunnel syndrome    Cataract immature    right   Chronic gastritis    Constipation    takes stool softener nightly   Foot deformity, bilateral    GERD (gastroesophageal reflux disease)    Hx of colonic polyps    Hyperlipidemia    takes Simvastatin daily   Hypertension    takes Hyzaar and Amlodipine daily   IPMN (intraductal papillary mucinous neoplasm)    1.2cm on CT 11-28-2016   Neuropathy    per patient   OA (osteoarthritis)    Osteoporosis    Pneumonia  2024, right lung per patient- patient was hospitalized   RA (rheumatoid arthritis) (HCC)    Seasonal allergies    Sensitive skin    pt states she has highly sensitive skin   Urinary incontinence     Family History  Problem Relation Age of Onset   Thyroid disease Mother    Alzheimer's disease Mother    Stroke Brother    Diabetes Son    Anesthesia problems Neg Hx    Hypotension Neg Hx    Pseudochol deficiency Neg Hx    Malignant hyperthermia Neg  Hx    Colon cancer Neg Hx    Rectal cancer Neg Hx    Stomach cancer Neg Hx    Past Surgical History:  Procedure Laterality Date   CATARACT EXTRACTION Bilateral    COLONOSCOPY  11/20/2016   Mild sigmoid diverticulosis. Otherwise normal colonoscopy to terminal ileum   DENTAL SURGERY     ESOPHAGOGASTRODUODENOSCOPY  11/06/2016   Transient schatzki's ring status post esophageal dilatation. Small hiatal hernia. 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   Procedure: TOTAL KNEE ARTHROPLASTY;  Surgeon: Nestor Lewandowsky, MD;  Location: [facility];  Service: Orthopedics;  Laterality: Left;  DEPUY/SIGMA   TOTAL KNEE ARTHROPLASTY Right 08/05/2018   Procedure: TOTAL KNEE ARTHROPLASTY;  Surgeon: Dannielle Huh, MD;  Location: [facility];  Service: Orthopedics;  Laterality: Right;   VAGINAL HYSTERECTOMY  1970's   partial   Social History   Social History Narrative   Not on file   Immunization History  Administered Date(s) Administered   Moderna Sars-Covid-2 Vaccination 09/18/2019, 10/14/2019, 07/19/2020     Objective: Vital Signs: BP (!) 147/74 (BP Location: Left Arm, Patient Position: Sitting, Cuff Size: Normal)   Pulse (!) 102   Resp 17   Ht 4\' 11"  (1.499 m)   Wt 147 lb 6.4 oz (66.9 kg)   BMI 29.77 kg/m    Physical Exam Vitals and nursing note reviewed.  Constitutional:      Appearance: She is well-developed.  HENT:     Head: Normocephalic and atraumatic.  Eyes:     Conjunctiva/sclera: Conjunctivae normal.  Cardiovascular:     Rate and Rhythm: Normal rate and regular rhythm.     Heart sounds: Normal heart sounds.  Pulmonary:     Effort: Pulmonary effort is normal.     Breath sounds: Normal breath sounds.  Abdominal:     General: Bowel sounds are normal.     Palpations: Abdomen is soft.  Musculoskeletal:     Cervical back: Normal range of motion.  Lymphadenopathy:     Cervical: No cervical adenopathy.  Skin:    General: Skin is warm and dry.      Capillary Refill: Capillary refill takes less than 2 seconds.  Neurological:     Mental Status: She is alert and oriented to person, place, and time.  Psychiatric:        Behavior: Behavior normal.      Musculoskeletal Exam: C-spine has limited range of motion with lateral rotation. Thoracic kyphosis type.  Right shoulder abduction to about 90 degrees.  No tenderness to palpation over the right glenohumeral joint.  Painful internal rotation of the right shoulder.  Left shoulder is full range of motion.  Elbow joints have good range of motion.  Synovial thickening of MCP joints but no synovitis noted.  PIP and DIP thickening.  Limited extension of bilateral 4th and 5th fingers. Hip joints have good range of  motion with no groin pain.  Knee replacements have good range of motion with no warmth or effusion. No tenderness or synovitis of ankle joints.   CDAI Exam: CDAI Score: -- Patient Global: 20 / 100; Provider Global: 20 / 100 Swollen: --; Tender: -- Joint Exam 05/22/2023   No joint exam has been documented for this visit   There is currently no information documented on the homunculus. Go to the Rheumatology activity and complete the homunculus joint exam.  Investigation: No additional findings.  Imaging: No results found.  Recent Labs: Lab Results  Component Value Date   WBC 6.3 12/14/2022   HGB 11.7 12/14/2022   PLT 340 12/14/2022   NA 136 12/14/2022   K 4.2 12/14/2022   CL 99 12/14/2022   CO2 28 12/14/2022   GLUCOSE 101 (H) 12/14/2022   BUN 10 12/14/2022   CREATININE 0.73 12/14/2022   BILITOT 0.3 12/14/2022   ALKPHOS 114 12/08/2021   AST 17 12/14/2022   ALT 9 12/14/2022   PROT 7.5 12/14/2022   ALBUMIN 4.2 12/08/2021   CALCIUM 9.8 12/14/2022   GFRAA 79 09/15/2020   QFTBGOLDPLUS NEGATIVE 10/11/2021    Speciality Comments: PLQ Eye Exam: 06/14/2022 WNL @ [facility] Follow up in   months [place] 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  She has not had any signs or symptoms of a rheumatoid arthritis flare.  She has clinically been doing well taking Plaquenil 200 mg 1 tablet by mouth twice daily Monday through Friday.  She continues to tolerate Plaquenil without any side effects.  She has not had any interruptions in therapy.  She has been under the care of pain management and has an upcoming appointment scheduled.  No medication changes will be made at this time.  She was advised to notify us if she develops increased joint pain or joint swelling.  She will follow-up in the office in 5 months or sooner if needed.  High risk medication use - Plaquenil 200 mg 1 tablet by mouth twice daily M-F.  Inadequate response to arava, d/c enbrel due to injection anxiety.   PLQ Eye Exam: 06/14/2022 WNL @ [facility].  According to the patient she had an updated Plaquenil eye examination on 04/26/2023--we will call to obtain these records. CBC and CMP updated on 12/14/22. Orders for CBC and CMP released today.   - Plan: CBC with Differential/Platelet, COMPLETE METABOLIC PANEL WITH GFR  Chronic right shoulder pain: Chronic pain.  Inadequate response to a right glenohumeral joint cortisone injection on 12/14/2022.  Under the care of pain management.  Has slightly limited abduction and internal rotation of the right shoulder.  Offered a referral to physical therapy but she has declined at this time.  She has an upcoming appointment with pain management.  Primary osteoarthritis of both hands: PIP and DIP thickening.  MCP thickening but no active inflammation noted.  Limited extension of right 4th and 5th digits.   Extensor tenosynovitis of right wrist: Resolved.  No active  inflammation noted on examination.   History of total right knee replacement: Doing well.  Good ROM with no warmth or effusion.   History of total knee replacement, left: Chronic pain.   Primary osteoarthritis of both feet: She is  not experiencing any increased discomfort in her feet at this time.   Other medical conditions are listed as follows:  Osteoporosis screening  History of hypertension: Blood pressure was elevated today in the office and was rechecked prior to leaving.  Patient was advised to monitor blood pressure closely and to reach out to PCP if her blood pressure remains elevated.  History of hyperlipidemia  History of peripheral neuropathy  Orders: Orders Placed This Encounter  Procedures   CBC with Differential/Platelet   COMPLETE METABOLIC PANEL WITH GFR   No orders of the defined types were placed in this encounter.    Follow-Up Instructions: Return in about 5 months (around 10/20/2023) for Rheumatoid arthritis.   Gearldine Bienenstock, PA-C  Note - This record has been created using Dragon software.  Chart creation errors have been sought, but may not always  have been located. Such creation errors do not reflect on  the standard of medical care.

## 2023-05-11 ENCOUNTER — Other Ambulatory Visit: Payer: Self-pay | Admitting: Physician Assistant

## 2023-05-11 DIAGNOSIS — M0609 Rheumatoid arthritis without rheumatoid factor, multiple sites: Secondary | ICD-10-CM

## 2023-05-11 NOTE — Telephone Encounter (Signed)
Last Fill: 02/16/2023  Eye exam: 06/14/2022 WNL   Labs: 12/14/2022 CBC and CMP normal.   Next Visit: 05/22/2023  Last Visit: 12/14/2022  [id number] arthritis of multiple sites with negative rheumatoid factor   Current Dose per office note 12/14/2022: plaquenil 200 mg 1 tablet twice daily M-F   Okay to refill Plaquenil?

## 2023-05-22 ENCOUNTER — Ambulatory Visit: Payer: 59 | Attending: Physician Assistant | Admitting: Physician Assistant

## 2023-05-22 ENCOUNTER — Encounter: Payer: Self-pay | Admitting: Physician Assistant

## 2023-05-22 VITALS — BP 147/74 | HR 102 | Resp 17 | Ht 59.0 in | Wt 147.4 lb

## 2023-05-22 DIAGNOSIS — M25511 Pain in right shoulder: Secondary | ICD-10-CM | POA: Diagnosis not present

## 2023-05-22 DIAGNOSIS — M0609 Rheumatoid arthritis without rheumatoid factor, multiple sites: Secondary | ICD-10-CM

## 2023-05-22 DIAGNOSIS — M19041 Primary osteoarthritis, right hand: Secondary | ICD-10-CM | POA: Diagnosis not present

## 2023-05-22 DIAGNOSIS — M65931 Unspecified synovitis and tenosynovitis, right forearm: Secondary | ICD-10-CM

## 2023-05-22 DIAGNOSIS — Z96651 Presence of right artificial knee joint: Secondary | ICD-10-CM

## 2023-05-22 DIAGNOSIS — Z79899 Other long term (current) drug therapy: Secondary | ICD-10-CM

## 2023-05-22 DIAGNOSIS — Z8669 Personal history of other diseases of the nervous system and sense organs: Secondary | ICD-10-CM

## 2023-05-22 DIAGNOSIS — G8929 Other chronic pain: Secondary | ICD-10-CM

## 2023-05-22 DIAGNOSIS — Z13820 Encounter for screening for osteoporosis: Secondary | ICD-10-CM

## 2023-05-22 DIAGNOSIS — Z8639 Personal history of other endocrine, nutritional and metabolic disease: Secondary | ICD-10-CM

## 2023-05-22 DIAGNOSIS — M65831 Other synovitis and tenosynovitis, right forearm: Secondary | ICD-10-CM

## 2023-05-22 DIAGNOSIS — M19072 Primary osteoarthritis, left ankle and foot: Secondary | ICD-10-CM

## 2023-05-22 DIAGNOSIS — M19071 Primary osteoarthritis, right ankle and foot: Secondary | ICD-10-CM

## 2023-05-22 DIAGNOSIS — Z96652 Presence of left artificial knee joint: Secondary | ICD-10-CM

## 2023-05-22 DIAGNOSIS — Z8679 Personal history of other diseases of the circulatory system: Secondary | ICD-10-CM

## 2023-05-22 DIAGNOSIS — M19042 Primary osteoarthritis, left hand: Secondary | ICD-10-CM

## 2023-05-23 LAB — COMPLETE METABOLIC PANEL WITH GFR
AG Ratio: 1.1 (calc) (ref 1.0–2.5)
ALT: 9 U/L (ref 6–29)
AST: 19 U/L (ref 10–35)
Albumin: 3.7 g/dL (ref 3.6–5.1)
Alkaline phosphatase (APISO): 106 U/L (ref 37–153)
BUN: 11 mg/dL (ref 7–25)
CO2: 27 mmol/L (ref 20–32)
Calcium: 10.1 mg/dL (ref 8.6–10.4)
Chloride: 103 mmol/L (ref 98–110)
Creat: 0.65 mg/dL (ref 0.60–0.95)
Globulin: 3.5 g/dL (ref 1.9–3.7)
Glucose, Bld: 89 mg/dL (ref 65–99)
Potassium: 4.2 mmol/L (ref 3.5–5.3)
Sodium: 139 mmol/L (ref 135–146)
Total Bilirubin: 0.2 mg/dL (ref 0.2–1.2)
Total Protein: 7.2 g/dL (ref 6.1–8.1)
eGFR: 88 mL/min/{1.73_m2} (ref >=60)

## 2023-05-23 LAB — CBC WITH DIFFERENTIAL/PLATELET
Absolute Monocytes: 780 {cells}/uL (ref 200–950)
Basophils Absolute: 42 {cells}/uL (ref 0–200)
Basophils Relative: 0.7 %
Eosinophils Absolute: 432 {cells}/uL (ref 15–500)
Eosinophils Relative: 7.2 %
HCT: 36.7 % (ref 35.0–45.0)
Hemoglobin: 11.4 g/dL — ABNORMAL LOW (ref 11.7–15.5)
Lymphs Abs: 1812 {cells}/uL (ref 850–3900)
MCH: 29 pg (ref 27.0–33.0)
MCHC: 31.1 g/dL — ABNORMAL LOW (ref 32.0–36.0)
MCV: 93.4 fL (ref 80.0–100.0)
MPV: 10.1 fL (ref 7.5–12.5)
Monocytes Relative: 13 %
Neutro Abs: 2934 {cells}/uL (ref 1500–7800)
Neutrophils Relative %: 48.9 %
Platelets: 317 10*3/uL (ref 140–400)
RBC: 3.93 10*6/uL (ref 3.80–5.10)
RDW: 13.8 % (ref 11.0–15.0)
Total Lymphocyte: 30.2 %
WBC: 6 10*3/uL (ref 3.8–10.8)

## 2023-05-23 NOTE — Progress Notes (Signed)
Hemoglobin is slightly low.  Forward lab results to PCP. CMP WNL

## 2023-08-01 ENCOUNTER — Other Ambulatory Visit: Payer: Self-pay | Admitting: Rheumatology

## 2023-08-01 DIAGNOSIS — M0609 Rheumatoid arthritis without rheumatoid factor, multiple sites: Secondary | ICD-10-CM

## 2023-08-02 NOTE — Telephone Encounter (Signed)
Last Fill: 05/11/2023  Eye exam: 04/26/2023 WNL    Labs: 05/22/2023 Hemoglobin is slightly low. CMP WNL   Next Visit: 10/30/2023  Last Visit: 05/22/2023  DX: Rheumatoid arthritis of multiple sites with negative rheumatoid factor   Current Dose per office note 05/22/2023: Plaquenil 200 mg 1 tablet by mouth twice daily M-F.   Okay to refill Plaquenil?

## 2023-10-17 NOTE — Progress Notes (Signed)
 Office Visit Note  Patient: Ann Gamble             Date of Birth: [birth year]           MRN: [id number]             PCP: Charlott Rakes, MD (Inactive) Referring: Charlott Rakes, MD Visit Date: 10/30/2023 Occupation: @GUAROCC @  Subjective:  Pain in joints  History of Present Illness: Ann Gamble is a 83 y.o. female with seronegative rheumatoid arthritis and osteoarthritis.  She returns today after last visit in October 2024.  Patient states that she has been taking Plaquenil 200 mg p.o. Monday to Friday however once a day only.  She denies any increased joint swelling.  She has been having some discomfort in her knee joints which is unchanged.  She states she continues to have some sinus problems and shortness of breath.  She also has chronic cough.  She has not seen a pulmonologist.    Activities of Daily Living:  Patient reports morning stiffness for several hours.   Patient Denies nocturnal pain.  Difficulty dressing/grooming: Reports Difficulty climbing stairs: Denies Difficulty getting out of chair: Denies Difficulty using hands for taps, buttons, cutlery, and/or writing: Reports  Review of Systems  Constitutional:  Positive for fatigue.  HENT:  Positive for mouth dryness. Negative for mouth sores.   Eyes:  Negative for dryness.  Respiratory:  Positive for cough and shortness of breath.   Cardiovascular:  Negative for chest pain and palpitations.  Gastrointestinal:  Negative for blood in stool, constipation and diarrhea.  Endocrine: Negative for increased urination.  Genitourinary:  Negative for involuntary urination.  Musculoskeletal:  Positive for joint pain, gait problem, joint pain and morning stiffness. Negative for joint swelling, myalgias, muscle weakness, muscle tenderness and myalgias.  Skin:  Positive for sensitivity to sunlight. Negative for color change, rash and hair loss.  Allergic/Immunologic: Negative for susceptible to infections.   Neurological:  Negative for dizziness and headaches.  Hematological:  Negative for swollen glands.  Psychiatric/Behavioral:  Negative for depressed mood and sleep disturbance. The patient is not nervous/anxious.     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 factor (HCC) 09/04/2016   High risk medication use 09/04/2016   Primary osteoarthritis of right knee 09/04/2016   History of total knee replacement, left 09/04/2016   Primary osteoarthritis of both hands 09/04/2016   Primary osteoarthritis of both feet 09/04/2016   Essential hypertension 09/04/2016   Dyslipidemia 09/04/2016    Past Medical History:  Diagnosis Date   Allergy    Anemia    Bilateral carpal tunnel syndrome    Cataract immature    right   Chronic gastritis    Constipation    takes stool softener nightly   Foot deformity, bilateral    GERD (gastroesophageal reflux disease)    Hx of colonic polyps    Hyperlipidemia    takes Simvastatin daily   Hypertension    takes Hyzaar and Amlodipine daily   IPMN (intraductal papillary mucinous neoplasm)    1.2cm on CT 11-28-2016   Neuropathy    per patient   OA (osteoarthritis)    Osteoporosis    Pneumonia    2024, right lung per patient- patient was hospitalized   RA (rheumatoid arthritis) (HCC)    Seasonal allergies    Sensitive skin    pt states  she has highly sensitive skin   Urinary incontinence     Family History  Problem Relation Age of Onset   Thyroid disease Mother    Alzheimer's disease Mother    Stroke Brother    Diabetes Son    Anesthesia problems Neg Hx    Hypotension Neg Hx    Pseudochol deficiency Neg Hx    Malignant hyperthermia Neg Hx    Colon cancer Neg Hx    Rectal cancer Neg Hx    Stomach cancer Neg Hx    Past Surgical History:  Procedure Laterality Date   CATARACT EXTRACTION Bilateral    COLONOSCOPY   11/20/2016   Mild sigmoid diverticulosis. Otherwise normal colonoscopy to terminal ileum   DENTAL SURGERY     ESOPHAGOGASTRODUODENOSCOPY  11/06/2016   Transient schatzki's ring status post esophageal dilatation. Small hiatal hernia. 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   Procedure: TOTAL KNEE ARTHROPLASTY;  Surgeon: Nestor Lewandowsky, MD;  Location: [facility];  Service: Orthopedics;  Laterality: Left;  DEPUY/SIGMA   TOTAL KNEE ARTHROPLASTY Right 08/05/2018   Procedure: TOTAL KNEE ARTHROPLASTY;  Surgeon: Dannielle Huh, MD;  Location: [facility];  Service: Orthopedics;  Laterality: Right;   VAGINAL HYSTERECTOMY  1970's   partial   Social History   Social History Narrative   Not on file   Immunization History  Administered Date(s) Administered   Moderna Sars-Covid-2 Vaccination 09/18/2019, 10/14/2019, 07/19/2020     Objective: Vital Signs: BP 132/83 (BP Location: Left Arm, Patient Position: Sitting, Cuff Size: Normal)   Pulse (!) 111   Ht 4\' 11"  (1.499 m)   Wt 137 lb (62.1 kg)   BMI 27.67 kg/m    Physical Exam Vitals and nursing note reviewed.  Constitutional:      Appearance: She is well-developed.  HENT:     Head: Normocephalic and atraumatic.  Eyes:     Conjunctiva/sclera: Conjunctivae normal.  Cardiovascular:     Rate and Rhythm: Normal rate and regular rhythm.     Heart sounds: Normal heart sounds.  Pulmonary:     Effort: Pulmonary effort is normal.     Breath sounds: Normal breath sounds.  Abdominal:     General: Bowel sounds are normal.     Palpations: Abdomen is soft.  Musculoskeletal:     Cervical back: Normal range of motion.  Lymphadenopathy:     Cervical: No cervical adenopathy.  Skin:    General: Skin is warm and dry.     Capillary Refill: Capillary refill takes less than 2 seconds.  Neurological:     Mental Status: She is alert and oriented to person, place, and time.  Psychiatric:        Behavior: Behavior normal.       Musculoskeletal Exam: Patient was examined in the seated position.  She had limited range of motion of the cervical spine with lateral rotation.  Thoracic kyphosis was noted without any point tenderness.  Right shoulder abduction was limited to 90 degrees without any discomfort.  Left shoulder was in full range of motion.  Elbow joints and wrist joints in good range of motion.  She had no synovitis over MCPs or PIPs.  Bilateral MCP thickening was noted.  PIP and DIP thickening with subluxation of several of her PIP and DIP joints was noted.  Hip joints could not be assessed in the seated position.  Knee joints were replaced without any warmth swelling or effusion.  There was no  tenderness over ankles or MTPs.  CDAI Exam: CDAI Score: -- Patient Global: --; Provider Global: -- Swollen: --; Tender: -- Joint Exam 10/30/2023   No joint exam has been documented for this visit   There is currently no information documented on the homunculus. Go to the Rheumatology activity and complete the homunculus joint exam.  Investigation: No additional findings.  Imaging: No results found.  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  No synovitis was noted on the examination today.  She states she has been taking hydroxychloroquine only 1 tablet by mouth Monday to Friday.  She has been getting eye examination every 6 months.  Labs obtained on May 22, 2023 were stable which were reviewed.  High risk medication use - Plaquenil 200 mg 1 tablet by mouth  daily M-F.  Inadequate response to arava, d/c enbrel due to injection anxiety.  PLQ Eye Exam: 10/23/2023.  Patient gets eye examination every 6 months.  Will get labs today.  Information immunization was placed in the AVS.  Chronic right shoulder pain -patient states that the right shoulder joint discomfort is manageable.  Inadequate response to a right glenohumeral joint cortisone injection on 12/14/2022.  Under the care of pain management.  Primary osteoarthritis of both hands-she has severe osteoarthritis in her bilateral hands with PIP and DIP subluxation.  Joint protection was discussed.  Extensor tenosynovitis of right wrist -no synovitis noted on the examination today.  History of total knee replacement, bilateral-she continues to have discomfort in the bilateral knee joints.  Primary osteoarthritis of both feet-she denies any discomfort today.  Shortness of breath-patient states that she has shortness of breath since she had pneumonia last year.  As she states she uses oxygen per recommendations of her PCP.  She also gives history of chronic cough.  I offered referral to a pulmonologist but she declined.  Association of ILD with rheumatoid arthritis was discussed.  Patient declined chest x-ray.  Osteoporosis screening-patient states that she gets DEXA scan through her PCP.  History of hypertension-blood pressure was elevated at 149/72.  Patient was advised to monitor blood pressure closely and follow-up with her PCP.  History of  hyperlipidemia  History of peripheral neuropathy  Orders: Orders Placed This Encounter  Procedures   CBC with Differential/Platelet   COMPLETE METABOLIC PANEL WITH GFR   Meds ordered this encounter  Medications   hydroxychloroquine (PLAQUENIL) 200 MG tablet    Sig: TAKE ONE TABLET BY MOUTH DAILY MONDAY THROUGH FRIDAY. DO NOT TAKE ON SATURDAY OR SUNDAY  Dispense:  60 tablet    Refill:  0     Follow-Up Instructions: Return in about 5 months (around 03/31/2024) for Rheumatoid arthritis, Osteoarthritis.   Pollyann Savoy, MD  Note - This record has been created using Animal nutritionist.  Chart creation errors have been sought, but may not always  have been located. Such creation errors do not reflect on  the standard of medical care.

## 2023-10-23 ENCOUNTER — Other Ambulatory Visit: Payer: Self-pay | Admitting: Physician Assistant

## 2023-10-23 DIAGNOSIS — M0609 Rheumatoid arthritis without rheumatoid factor, multiple sites: Secondary | ICD-10-CM

## 2023-10-24 NOTE — Telephone Encounter (Signed)
 Last Fill: 08/02/2023  Eye exam: 04/26/2023 WNL   Labs: 05/22/2023 Hemoglobin is slightly low. CMP WNL   Next Visit: 10/30/2023  Last Visit: 05/22/2023  [id number] arthritis of multiple sites with negative rheumatoid factor   Current Dose per office note 05/22/2023: Plaquenil 200 mg 1 tablet by mouth twice daily M-F.   Okay to refill Plaquenil?

## 2023-10-30 ENCOUNTER — Encounter: Payer: Self-pay | Admitting: Rheumatology

## 2023-10-30 ENCOUNTER — Ambulatory Visit: Payer: 59 | Attending: Rheumatology | Admitting: Rheumatology

## 2023-10-30 VITALS — BP 132/83 | HR 111 | Ht 59.0 in | Wt 137.0 lb

## 2023-10-30 DIAGNOSIS — Z8639 Personal history of other endocrine, nutritional and metabolic disease: Secondary | ICD-10-CM

## 2023-10-30 DIAGNOSIS — M0609 Rheumatoid arthritis without rheumatoid factor, multiple sites: Secondary | ICD-10-CM

## 2023-10-30 DIAGNOSIS — M65931 Unspecified synovitis and tenosynovitis, right forearm: Secondary | ICD-10-CM

## 2023-10-30 DIAGNOSIS — G8929 Other chronic pain: Secondary | ICD-10-CM

## 2023-10-30 DIAGNOSIS — M25511 Pain in right shoulder: Secondary | ICD-10-CM | POA: Diagnosis not present

## 2023-10-30 DIAGNOSIS — Z96651 Presence of right artificial knee joint: Secondary | ICD-10-CM

## 2023-10-30 DIAGNOSIS — M19042 Primary osteoarthritis, left hand: Secondary | ICD-10-CM

## 2023-10-30 DIAGNOSIS — M19072 Primary osteoarthritis, left ankle and foot: Secondary | ICD-10-CM

## 2023-10-30 DIAGNOSIS — Z96653 Presence of artificial knee joint, bilateral: Secondary | ICD-10-CM

## 2023-10-30 DIAGNOSIS — M19041 Primary osteoarthritis, right hand: Secondary | ICD-10-CM

## 2023-10-30 DIAGNOSIS — R0602 Shortness of breath: Secondary | ICD-10-CM

## 2023-10-30 DIAGNOSIS — Z96652 Presence of left artificial knee joint: Secondary | ICD-10-CM

## 2023-10-30 DIAGNOSIS — M19071 Primary osteoarthritis, right ankle and foot: Secondary | ICD-10-CM

## 2023-10-30 DIAGNOSIS — Z8679 Personal history of other diseases of the circulatory system: Secondary | ICD-10-CM

## 2023-10-30 DIAGNOSIS — Z13820 Encounter for screening for osteoporosis: Secondary | ICD-10-CM

## 2023-10-30 DIAGNOSIS — Z8669 Personal history of other diseases of the nervous system and sense organs: Secondary | ICD-10-CM

## 2023-10-30 DIAGNOSIS — Z79899 Other long term (current) drug therapy: Secondary | ICD-10-CM | POA: Diagnosis not present

## 2023-10-30 MED ORDER — HYDROXYCHLOROQUINE SULFATE 200 MG PO TABS: ORAL_TABLET | ORAL | 0 refills | Status: DC

## 2023-10-30 NOTE — Patient Instructions (Signed)
 Vaccines You are taking a medication(s) that can suppress your immune system.  The following immunizations are recommended: Flu annually Covid-19  Td/Tdap (tetanus, diphtheria, pertussis) every 10 years Pneumonia (Prevnar 15 then Pneumovax 23 at least 1 year apart.  Alternatively, can take Prevnar 20 without needing additional dose) Shingrix: 2 doses from 4 weeks to 6 months apart  Please check with your PCP to make sure you are up to date.

## 2023-10-31 LAB — CBC WITH DIFFERENTIAL/PLATELET
Absolute Lymphocytes: 1474 {cells}/uL (ref 850–3900)
Absolute Monocytes: 824 {cells}/uL (ref 200–950)
Basophils Absolute: 40 {cells}/uL (ref 0–200)
Basophils Relative: 0.6 %
Eosinophils Absolute: 469 {cells}/uL (ref 15–500)
Eosinophils Relative: 7 %
HCT: 31.3 % — ABNORMAL LOW (ref 35.0–45.0)
Hemoglobin: 9.6 g/dL — ABNORMAL LOW (ref 11.7–15.5)
MCH: 28 pg (ref 27.0–33.0)
MCHC: 30.7 g/dL — ABNORMAL LOW (ref 32.0–36.0)
MCV: 91.3 fL (ref 80.0–100.0)
MPV: 10.1 fL (ref 7.5–12.5)
Monocytes Relative: 12.3 %
Neutro Abs: 3893 {cells}/uL (ref 1500–7800)
Neutrophils Relative %: 58.1 %
Platelets: 357 10*3/uL (ref 140–400)
RBC: 3.43 10*6/uL — ABNORMAL LOW (ref 3.80–5.10)
RDW: 13.4 % (ref 11.0–15.0)
Total Lymphocyte: 22 %
WBC: 6.7 10*3/uL (ref 3.8–10.8)

## 2023-10-31 LAB — COMPLETE METABOLIC PANEL WITH GFR
AG Ratio: 1.1 (calc) (ref 1.0–2.5)
ALT: 14 U/L (ref 6–29)
AST: 29 U/L (ref 10–35)
Albumin: 3.6 g/dL (ref 3.6–5.1)
Alkaline phosphatase (APISO): 119 U/L (ref 37–153)
BUN/Creatinine Ratio: 22 (calc) (ref 6–22)
BUN: 12 mg/dL (ref 7–25)
CO2: 26 mmol/L (ref 20–32)
Calcium: 9.9 mg/dL (ref 8.6–10.4)
Chloride: 100 mmol/L (ref 98–110)
Creat: 0.55 mg/dL — ABNORMAL LOW (ref 0.60–0.95)
Globulin: 3.3 g/dL (ref 1.9–3.7)
Glucose, Bld: 116 mg/dL — ABNORMAL HIGH (ref 65–99)
Potassium: 4.5 mmol/L (ref 3.5–5.3)
Sodium: 135 mmol/L (ref 135–146)
Total Bilirubin: 0.3 mg/dL (ref 0.2–1.2)
Total Protein: 6.9 g/dL (ref 6.1–8.1)

## 2023-10-31 NOTE — Progress Notes (Signed)
 Hemoglobin is low.  CMP was stable.  Patient should be evaluated by her PCP for the drop in hemoglobin.  Please forward results to her PCP.

## 2023-11-14 ENCOUNTER — Telehealth: Payer: Self-pay | Admitting: *Deleted

## 2023-11-14 NOTE — Telephone Encounter (Signed)
 Labs received from: [facility]  Drawn on:11/09/2023  Reviewed by: Dr. Pollyann Savoy   Labs drawn:Iron and TIBC, Folate, Vitamin B12, Ferritin  Results: Iron 23   Iron Saturation 8   Vitamin B12 >2000  Patient is on PLQ one tablet by mouth daily.

## 2023-11-30 ENCOUNTER — Encounter: Payer: Self-pay | Admitting: Family Medicine

## 2023-12-03 ENCOUNTER — Other Ambulatory Visit: Payer: Self-pay | Admitting: Hematology and Oncology

## 2023-12-03 DIAGNOSIS — D649 Anemia, unspecified: Secondary | ICD-10-CM

## 2023-12-03 NOTE — Progress Notes (Cosign Needed Addendum)
 [facility] [address] [place],  [place]  [address] [phone]   Addendum: Iron studies were equivocal, likely due to being on oral iron, and ferritin can be elevated as it is an acute phase reactant.  Soluble transferrin is significantly elevated, which is consistent with iron deficiency.  I discussed giving IV iron with the patient and her daughter.  They would like to wait till her next appointment to decide whether to proceed with this and will keep her appointment on May 2.  Clinic Day:  12/04/2023   Referring physician: Lonie Roa, MD  Patient Care Team: Patient Care Team: Barbar Levine, MD as PCP - General (Family Medicine)   REASON FOR CONSULTATION:  Anemia  HISTORY OF PRESENT ILLNESS:  Ann Gamble is a 83 y.o. female with anemia who is referred in consultation by Dr. Lonie Roa for assessment and management.   Her hemoglobin was 9.6 at rheumatology on March 18.  The patient is on Plaquenil  for rheumatoid arthritis. She was seen by Dr. Arlena Lacrosse March 27 for follow-up of anemia.  TIBC was 283, serum iron 23 and iron saturation 8.  Ferritin was 74. Folate was normal.  Vitamin B12 was greater than 2000.  She is on ferrous gluconate daily. She received IV Feraheme in October 2022.  The patient reports mild fatigue.  She denies pica to ice.  She denies any overt form of blood loss.  She was found to have iron deficiency in March 2018.  EGD in March 2018 revealed mild gastritis without active bleeding, there was a small hiatal hernia, and a Schatzki's ring was dilated.  She was placed on Protonix  40 mg daily with Tums for breakthrough.  Colonoscopy in April 2018 did not reveal source of blood loss. Repeat EGD and colonoscopy in 2022 did not reveal any source of blood loss.  Two non-bleeding gastric AVMs.  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 Non-bleeding internal hemorrhoids were found  during retroflexion. The hemorrhoids were small and Grade I.  Past medical history: Hypertension, hyperlipidemia, oxygen dependent COPD, pulmonary hypertension, prediabetes, rheumatoid arthritis, osteoarthritis.  History of iron deficiency anemia, COVID-pneumonia.  Status post bilateral knee replacements, hysterectomy, right cataract extraction.  Social history: She was born and raised in [place].  She is widowed with 3 adult children.  She is a former smoker, quit in 2005.  She denies alcohol  use.  She denies other substance use.  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  Family history:  Her daughter and granddaughter have anemia. Her granddaughter had cervical dysplasia. No other known blood dyscrasias or malignancy.   REVIEW OF SYSTEMS:  Review of Systems  Constitutional:  Negative for appetite change, chills, diaphoresis, fatigue, fever and unexpected weight change.  HENT:   Negative for lump/mass, mouth sores, nosebleeds, sore throat and trouble swallowing.   Respiratory:  Negative for cough, hemoptysis and shortness of breath.   Cardiovascular:  Positive for leg swelling (up and down). Negative for chest pain.  Gastrointestinal:  Negative for abdominal pain, blood in stool, constipation, diarrhea, nausea and vomiting.  Endocrine: Negative for hot flashes.  Genitourinary:  Negative for difficulty urinating, dysuria, frequency, hematuria and vaginal bleeding.   Musculoskeletal:  Positive for arthralgias and gait problem (uses rolling walker for stability). Negative for back pain, myalgias and neck pain.  Skin:  Negative for rash.  Neurological:  Positive for dizziness (spinning when  she lies down), gait problem (uses rolling walker for stability) and numbness (neuropathy of bilateral feet). Negative for extremity weakness and headaches.  Hematological:  Negative for adenopathy. Does not bruise/bleed easily.  Psychiatric/Behavioral:   Negative for depression and sleep disturbance. The patient is not nervous/anxious.      VITALS:  Blood pressure 94/69, pulse (!) 105, temperature 98.5 F (36.9 C), temperature source Oral, resp. rate 18, height 4' 11.1" (1.501 m), weight 132 lb 14.4 oz (60.3 kg), SpO2 98%.  Wt Readings from Last 3 Encounters:  12/04/23 132 lb 14.4 oz (60.3 kg)  10/30/23 137 lb (62.1 kg)  05/22/23 147 lb 6.4 oz (66.9 kg)    Body mass index is 26.75 kg/m.  Performance status (ECOG): 2 - Symptomatic, <50% confined to bed  PHYSICAL EXAM:  Physical Exam Vitals and nursing note reviewed.  Constitutional:      General: She is not in acute distress.    Appearance: Normal appearance.  HENT:     Head: Normocephalic and atraumatic.     Mouth/Throat:     Mouth: Mucous membranes are moist.     Pharynx: Oropharynx is clear. No oropharyngeal exudate or posterior oropharyngeal erythema.  Eyes:     General: No scleral icterus.    Extraocular Movements: Extraocular movements intact.     Conjunctiva/sclera: Conjunctivae normal.     Pupils: Pupils are equal, round, and reactive to light.  Cardiovascular:     Rate and Rhythm: Normal rate and regular rhythm.     Heart sounds: Normal heart sounds. No murmur heard.    No friction rub. No gallop.  Pulmonary:     Effort: Pulmonary effort is normal.     Breath sounds: Normal breath sounds. No wheezing, rhonchi or rales.  Abdominal:     General: There is no distension.     Palpations: Abdomen is soft. There is no hepatomegaly, splenomegaly or mass.     Tenderness: There is no abdominal tenderness.  Musculoskeletal:        General: Normal range of motion.     Cervical back: Normal range of motion and neck supple. No tenderness.     Right lower leg: Edema (trace) present.     Left lower leg: Edema (trace) present.  Lymphadenopathy:     Cervical: No cervical adenopathy.     Upper Body:     Right upper body: No supraclavicular or axillary adenopathy.     Left  upper body: No supraclavicular or axillary adenopathy.     Lower Body: No right inguinal adenopathy. No left inguinal adenopathy.  Skin:    General: Skin is warm and dry.     Coloration: Skin is not jaundiced.     Findings: No rash.  Neurological:     Mental Status: She is alert and oriented to person, place, and time.     Cranial Nerves: No cranial nerve deficit.  Psychiatric:        Mood and Affect: Mood normal.        Behavior: Behavior normal.        Thought Content: Thought content normal.      LABS:      Latest Ref Rng & Units 12/04/2023    2:09 PM 10/30/2023    4:01 PM 05/22/2023    3:01 PM  CBC  WBC 4.0 - 10.5 K/uL 7.0  6.7  6.0   Hemoglobin 12.0 - 15.0 g/dL 9.0  9.6  16.1   Hematocrit 36.0 - 46.0 % 29.7  31.3  36.7   Platelets 150 - 400 K/uL 324  357  317       Latest Ref Rng & Units 10/30/2023    4:01 PM 05/22/2023    3:01 PM 12/14/2022    2:53 PM  CMP  Glucose 65 - 99 mg/dL 295  89  621   BUN 7 - 25 mg/dL 12  11  10    Creatinine 0.60 - 0.95 mg/dL 3.08  6.57  8.46   Sodium 135 - 146 mmol/L 135  139  136   Potassium 3.5 - 5.3 mmol/L 4.5  4.2  4.2   Chloride 98 - 110 mmol/L 100  103  99   CO2 20 - 32 mmol/L 26  27  28    Calcium 8.6 - 10.4 mg/dL 9.9  96.2  9.8   Total Protein 6.1 - 8.1 g/dL 6.9  7.2  7.5   Total Bilirubin 0.2 - 1.2 mg/dL 0.3  0.2  0.3   AST 10 - 35 U/L 29  19  17    ALT 6 - 29 U/L 14  9  9       Lab Results  Component Value Date   ALBUMINELP 3.6 (L) 10/11/2021   A1GS 0.4 (H) 10/11/2021   A2GS 0.7 10/11/2021   BETS 0.4 10/11/2021   BETA2SER 0.4 10/11/2021   GAMS 1.3 10/11/2021   SPEI  10/11/2021     Comment:     . A poorly-defined band of restricted protein mobility is detected in the gamma globulins. It is unlikely that this may represent a monoclonal protein; however, immunofixation analysis is available if clinically indicated. .    Lab Results  Component Value Date   LDH 348 (H) 12/04/2023    STUDIES:  No results found.     HISTORY:   Past Medical History:  Diagnosis Date   Allergy    Anemia    Bilateral carpal tunnel syndrome    Cataract immature    right   Chronic gastritis    Constipation    takes stool softener nightly   COPD (chronic obstructive pulmonary disease) (HCC)    Foot deformity, bilateral    GERD (gastroesophageal reflux disease)    Hx of colonic polyps    Hyperlipidemia    takes Simvastatin  daily   Hypertension    takes Hyzaar and Amlodipine  daily   IPMN (intraductal papillary mucinous neoplasm)    1.2cm on CT 11-28-2016   Neuropathy    per patient   OA (osteoarthritis)    Osteoporosis    Pneumonia    2024, right lung per patient- patient was hospitalized   Prediabetes    RA (rheumatoid arthritis) (HCC)    Seasonal allergies    Sensitive skin    pt states she has highly sensitive skin   Urinary incontinence     Past Surgical History:  Procedure Laterality Date   CATARACT EXTRACTION Bilateral    COLONOSCOPY  11/20/2016   Mild sigmoid diverticulosis. Otherwise normal colonoscopy to terminal ileum   DENTAL SURGERY     ESOPHAGOGASTRODUODENOSCOPY  11/06/2016   Transient schatzki's ring status post esophageal dilatation. Small hiatal hernia. Mild gastritis   TONSILLECTOMY  1960   TOTAL KNEE ARTHROPLASTY Left 10/30/2011   Procedure: TOTAL KNEE ARTHROPLASTY;  Surgeon: Ilean Mall, MD;  Location: [facility] OR;  Service: Orthopedics;  Laterality: Left;  DEPUY/SIGMA   TOTAL KNEE ARTHROPLASTY Right 08/05/2018   Procedure: TOTAL KNEE ARTHROPLASTY;  Surgeon: Christie Cox, MD;  Location: [facility] ORS;  Service: Orthopedics;  Laterality: Right;   VAGINAL HYSTERECTOMY  1970's   partial    Family History  Problem Relation Age of Onset   Thyroid disease Mother    Alzheimer's disease Mother    Diabetes Son    Anesthesia problems Neg Hx    Hypotension Neg Hx    Pseudochol deficiency Neg Hx    Malignant hyperthermia Neg Hx    Colon cancer Neg Hx    Rectal cancer Neg Hx    Stomach  cancer Neg Hx     Social History:  reports that she quit smoking about 20 years ago. Her smoking use included cigarettes. She started smoking about 63 years ago. She has a 21.5 pack-year smoking history. She has never been exposed to tobacco smoke. She has never used smokeless tobacco. She reports that she does not currently use alcohol . She reports that she does not use drugs.The patient is accompanied by her daughter today.  Allergies:  Allergies  Allergen Reactions   Benadryl  [Diphenhydramine ] Nausea Only    Dry throat   Lactose Intolerance (Gi) Other (See Comments)    Gas pain   Antihistamines, Chlorpheniramine-Type Other (See Comments)    "Made her real dry in her throat, nausea"    Other Rash    Wool and Sun Ultraviolet rays   Tape Rash    Current Medications: Current Outpatient Medications  Medication Sig Dispense Refill   cetirizine (ZYRTEC) 10 MG chewable tablet Chew 10 mg by mouth daily.     amitriptyline (ELAVIL) 10 MG tablet Take 10 mg by mouth at bedtime.     ferrous gluconate (FERGON) 324 MG tablet Take by mouth.     GEMTESA 75 MG TABS Take 1 tablet by mouth daily.     HYDROcodone -acetaminophen  (NORCO/VICODIN) 5-325 MG tablet Take 1 tablet by mouth 2 (two) times daily.     hydroxychloroquine  (PLAQUENIL ) 200 MG tablet TAKE ONE TABLET BY MOUTH DAILY MONDAY THROUGH FRIDAY. DO NOT TAKE ON SATURDAY OR SUNDAY 60 tablet 0   losartan  (COZAAR ) 100 MG tablet Take 100 mg by mouth daily.      MULTIPLE VITAMIN PO Take 1 tablet by mouth daily.      MYRBETRIQ 50 MG TB24 tablet Take 50 mg by mouth daily.     omeprazole  (PRILOSEC) 40 MG capsule Take 40 mg by mouth daily.     Ophthalmic Irrigation Solution (OCUSOFT EYE WASH OP) Apply to eye.      Polyethylene Glycol 3350 (MIRALAX PO) Take by mouth daily.     simvastatin  (ZOCOR ) 10 MG tablet Take 10 mg by mouth every evening.  (Patient not taking: Reported on 12/04/2023)     sodium chloride  (OCEAN) 0.65 % nasal spray Place 2 sprays into  the nose as needed for congestion.      No current facility-administered medications for this visit.     ASSESSMENT & PLAN:   Assessment/Plan:  Ann Gamble is a 83 y.o. female with worsening anemia, which may be multifactorial due to medication and anemia of chronic disease, but possibly hemolysis or myelodysplasia. Will evaluate for other causes of anemia. I will have her follow up with Dr. Harles Lied in about 10 days for further recommendations.  I discussed the assessment and plan with the patient and her daughter.  They were provided an opportunity to ask questions and all were answered.  The patient agreed with the plan and demonstrated an understanding of the instructions.    Thank you for the referral  45 minutes was spent in patient care.  This included time spent preparing to see the patient (e.g., review of tests), obtaining and/or reviewing separately obtained history, counseling and educating the patient/family/caregiver, ordering medications, tests, or procedures; documenting clinical information in the electronic or other health record, independently interpreting results and communicating results to the patient/family/caregiver as well as coordination of care.      Alfonso Ike, PA-C   Physician Assistant [facility]  [phone]

## 2023-12-04 ENCOUNTER — Telehealth: Payer: Self-pay | Admitting: Hematology and Oncology

## 2023-12-04 ENCOUNTER — Inpatient Hospital Stay: Attending: Hematology and Oncology | Admitting: Hematology and Oncology

## 2023-12-04 ENCOUNTER — Inpatient Hospital Stay

## 2023-12-04 ENCOUNTER — Encounter: Payer: Self-pay | Admitting: Hematology and Oncology

## 2023-12-04 ENCOUNTER — Other Ambulatory Visit: Payer: Self-pay | Admitting: Hematology and Oncology

## 2023-12-04 VITALS — BP 94/69 | HR 105 | Temp 98.5°F | Resp 18 | Ht 59.1 in | Wt 132.9 lb

## 2023-12-04 DIAGNOSIS — D509 Iron deficiency anemia, unspecified: Secondary | ICD-10-CM | POA: Insufficient documentation

## 2023-12-04 DIAGNOSIS — D649 Anemia, unspecified: Secondary | ICD-10-CM

## 2023-12-04 LAB — TECHNOLOGIST SMEAR REVIEW: Plt Morphology: NORMAL

## 2023-12-04 LAB — CBC WITH DIFFERENTIAL (CANCER CENTER ONLY)
Abs Immature Granulocytes: 0.02 10*3/uL (ref 0.00–0.07)
Basophils Absolute: 0 10*3/uL (ref 0.0–0.1)
Basophils Relative: 1 %
Eosinophils Absolute: 0.4 10*3/uL (ref 0.0–0.5)
Eosinophils Relative: 6 %
HCT: 29.7 % — ABNORMAL LOW (ref 36.0–46.0)
Hemoglobin: 9 g/dL — ABNORMAL LOW (ref 12.0–15.0)
Immature Granulocytes: 0 %
Lymphocytes Relative: 22 %
Lymphs Abs: 1.6 10*3/uL (ref 0.7–4.0)
MCH: 27.4 pg (ref 26.0–34.0)
MCHC: 30.3 g/dL (ref 30.0–36.0)
MCV: 90.3 fL (ref 80.0–100.0)
Monocytes Absolute: 0.8 10*3/uL (ref 0.1–1.0)
Monocytes Relative: 11 %
Neutro Abs: 4.2 10*3/uL (ref 1.7–7.7)
Neutrophils Relative %: 60 %
Platelet Count: 324 10*3/uL (ref 150–400)
RBC: 3.29 MIL/uL — ABNORMAL LOW (ref 3.87–5.11)
RDW: 14.2 % (ref 11.5–15.5)
WBC Count: 7 10*3/uL (ref 4.0–10.5)
nRBC: 0 % (ref 0.0–0.2)
nRBC: 0 /100{WBCs}

## 2023-12-04 LAB — DIRECT ANTIGLOBULIN TEST (NOT AT ARMC)
DAT, IgG: NEGATIVE
DAT, complement: NEGATIVE

## 2023-12-04 LAB — RETICULOCYTES
Immature Retic Fract: 12.7 % (ref 2.3–15.9)
RBC.: 3.29 MIL/uL — ABNORMAL LOW (ref 3.87–5.11)
Retic Count, Absolute: 49.7 10*3/uL (ref 19.0–186.0)
Retic Ct Pct: 1.5 % (ref 0.4–3.1)

## 2023-12-04 LAB — LACTATE DEHYDROGENASE: LDH: 348 U/L — ABNORMAL HIGH (ref 98–192)

## 2023-12-04 NOTE — Telephone Encounter (Signed)
 Patient has been scheduled for follow-up visit per 12/04/23 LOS.  Pt given an appt calendar with date and time.

## 2023-12-05 LAB — PROTEIN ELECTROPHORESIS, SERUM, WITH REFLEX
A/G Ratio: 0.8 (ref 0.7–1.7)
Albumin ELP: 2.9 g/dL (ref 2.9–4.4)
Alpha-1-Globulin: 0.4 g/dL (ref 0.0–0.4)
Alpha-2-Globulin: 0.7 g/dL (ref 0.4–1.0)
Beta Globulin: 1.1 g/dL (ref 0.7–1.3)
Gamma Globulin: 1.6 g/dL (ref 0.4–1.8)
Globulin, Total: 3.8 g/dL (ref 2.2–3.9)
Total Protein ELP: 6.7 g/dL (ref 6.0–8.5)

## 2023-12-05 LAB — HAPTOGLOBIN: Haptoglobin: 176 mg/dL (ref 41–333)

## 2023-12-06 LAB — SOLUBLE TRANSFERRIN RECEPTOR: Transferrin Receptor: 47.8 nmol/L — ABNORMAL HIGH (ref 12.2–27.3)

## 2023-12-14 ENCOUNTER — Other Ambulatory Visit: Payer: Self-pay | Admitting: Oncology

## 2023-12-14 ENCOUNTER — Inpatient Hospital Stay: Attending: Hematology and Oncology | Admitting: Oncology

## 2023-12-14 ENCOUNTER — Inpatient Hospital Stay

## 2023-12-14 VITALS — BP 138/63 | HR 113 | Temp 98.4°F | Resp 16 | Ht 59.1 in | Wt 131.1 lb

## 2023-12-14 DIAGNOSIS — D509 Iron deficiency anemia, unspecified: Secondary | ICD-10-CM | POA: Insufficient documentation

## 2023-12-14 DIAGNOSIS — D649 Anemia, unspecified: Secondary | ICD-10-CM

## 2023-12-14 DIAGNOSIS — D508 Other iron deficiency anemias: Secondary | ICD-10-CM

## 2023-12-14 LAB — IRON AND TIBC
Iron: 30 ug/dL (ref 28–170)
Saturation Ratios: 9 % — ABNORMAL LOW (ref 10.4–31.8)
TIBC: 337 ug/dL (ref 250–450)
UIBC: 307 ug/dL

## 2023-12-14 LAB — CBC WITH DIFFERENTIAL (CANCER CENTER ONLY)
Abs Immature Granulocytes: 0.02 10*3/uL (ref 0.00–0.07)
Basophils Absolute: 0.1 10*3/uL (ref 0.0–0.1)
Basophils Relative: 1 %
Eosinophils Absolute: 0.3 10*3/uL (ref 0.0–0.5)
Eosinophils Relative: 4 %
HCT: 26.2 % — ABNORMAL LOW (ref 36.0–46.0)
Hemoglobin: 8 g/dL — ABNORMAL LOW (ref 12.0–15.0)
Immature Granulocytes: 0 %
Lymphocytes Relative: 17 %
Lymphs Abs: 1.3 10*3/uL (ref 0.7–4.0)
MCH: 27.1 pg (ref 26.0–34.0)
MCHC: 30.5 g/dL (ref 30.0–36.0)
MCV: 88.8 fL (ref 80.0–100.0)
Monocytes Absolute: 0.9 10*3/uL (ref 0.1–1.0)
Monocytes Relative: 12 %
Neutro Abs: 5.2 10*3/uL (ref 1.7–7.7)
Neutrophils Relative %: 66 %
Platelet Count: 360 10*3/uL (ref 150–400)
RBC: 2.95 MIL/uL — ABNORMAL LOW (ref 3.87–5.11)
RDW: 14.9 % (ref 11.5–15.5)
WBC Count: 7.9 10*3/uL (ref 4.0–10.5)
nRBC: 0 % (ref 0.0–0.2)
nRBC: 0 /100{WBCs}

## 2023-12-14 LAB — FERRITIN: Ferritin: 41 ng/mL (ref 11–307)

## 2023-12-16 DIAGNOSIS — D509 Iron deficiency anemia, unspecified: Secondary | ICD-10-CM | POA: Insufficient documentation

## 2023-12-16 NOTE — Progress Notes (Signed)
 [facility]  [address] [place],  [place]  [address] [phone]  Clinic Day:  12/16/2023  Referring physician: Barbar Levine, MD   HISTORY OF PRESENT ILLNESS:  The patient is an 83 y.o. female who our office recently had seen for anemia.  She comes in today to go over her labs to determine the etiology behind this.  Since her last visit, the patient has been doing okay.  She does have a steady, baseline level of fatigue, but denies having any overt forms of blood loss.   PHYSICAL EXAM:  Blood pressure 138/63, pulse (!) 113, temperature 98.4 F (36.9 C), temperature source Oral, resp. rate 16, height 4' 11.1" (1.501 m), weight 131 lb 1.6 oz (59.5 kg), SpO2 94%. Wt Readings from Last 3 Encounters:  12/14/23 131 lb 1.6 oz (59.5 kg)  12/04/23 132 lb 14.4 oz (60.3 kg)  10/30/23 137 lb (62.1 kg)   Body mass index is 26.39 kg/m. Performance status (ECOG): 2 - Symptomatic, <50% confined to bed Physical Exam Constitutional:      Appearance: Normal appearance. She is not ill-appearing.     Comments: A chronically ill-appearing woman wearing oxygen per nasal cannula  HENT:     Mouth/Throat:     Mouth: Mucous membranes are moist.     Pharynx: Oropharynx is clear. No oropharyngeal exudate or posterior oropharyngeal erythema.  Cardiovascular:     Rate and Rhythm: Normal rate and regular rhythm.     Heart sounds: No murmur heard.    No friction rub. No gallop.  Pulmonary:     Effort: Pulmonary effort is normal. No respiratory distress.     Breath sounds: Decreased air movement present. Decreased breath sounds present. No wheezing, rhonchi or rales.  Abdominal:     General: Bowel sounds are normal. There is no distension.     Palpations: Abdomen is soft. There is no mass.     Tenderness: There is no abdominal tenderness.  Musculoskeletal:        General: No swelling.     Right lower leg: No edema.     Left lower leg: No edema.   Lymphadenopathy:     Cervical: No cervical adenopathy.     Upper Body:     Right upper body: No supraclavicular or axillary adenopathy.     Left upper body: No supraclavicular or axillary adenopathy.     Lower Body: No right inguinal adenopathy. No left inguinal adenopathy.  Skin:    General: Skin is warm.     Coloration: Skin is not jaundiced.     Findings: No lesion or rash.  Neurological:     General: No focal deficit present.     Mental Status: She is alert and oriented to person, place, and time. Mental status is at baseline.  Psychiatric:        Mood and Affect: Mood normal.        Behavior: Behavior normal.        Thought Content: Thought content normal.    LABS:      Latest Ref Rng & Units 12/14/2023    2:17 PM 12/04/2023    2:09 PM 10/30/2023    4:01 PM  CBC  WBC 4.0 - 10.5 K/uL 7.9  7.0  6.7   Hemoglobin 12.0 - 15.0 g/dL 8.0  9.0  9.6   Hematocrit 36.0 - 46.0 % 26.2  29.7  31.3   Platelets 150 - 400 K/uL 360  324  357  Latest Ref Rng & Units 10/30/2023    4:01 PM 05/22/2023    3:01 PM 12/14/2022    2:53 PM  CMP  Glucose 65 - 99 mg/dL 161  89  096   BUN 7 - 25 mg/dL 12  11  10    Creatinine 0.60 - 0.95 mg/dL 0.45  4.09  8.11   Sodium 135 - 146 mmol/L 135  139  136   Potassium 3.5 - 5.3 mmol/L 4.5  4.2  4.2   Chloride 98 - 110 mmol/L 100  103  99   CO2 20 - 32 mmol/L 26  27  28    Calcium 8.6 - 10.4 mg/dL 9.9  91.4  9.8   Total Protein 6.1 - 8.1 g/dL 6.9  7.2  7.5   Total Bilirubin 0.2 - 1.2 mg/dL 0.3  0.2  0.3   AST 10 - 35 U/L 29  19  17    ALT 6 - 29 U/L 14  9  9      Latest Reference Range & Units 12/04/23 14:09 12/14/23 14:17  Iron 28 - 170 ug/dL  30  UIBC ug/dL  782  TIBC 956 - 213 ug/dL  086  Saturation Ratios 10.4 - 31.8 %  9 (L)  Ferritin 11 - 307 ng/mL  41  Transferrin Receptor 12.2 - 27.3 nmol/L 47.8 (H)   (L): Data is abnormally low (H): Data is abnormally high   Latest Reference Range & Units 12/04/23 14:10  Total Protein ELP 6.0 - 8.5  g/dL 6.7  Albumin ELP 2.9 - 4.4 g/dL 2.9  SPEP Interpretation  Comment  Globulin, Total 2.2 - 3.9 g/dL 3.8 (C)  A/G Ratio 0.7 - 1.7  0.8 (C)  Alpha-1-Globulin 0.0 - 0.4 g/dL 0.4  VHQIO-9-GEXBMWUX 0.4 - 1.0 g/dL 0.7  Beta Globulin 0.7 - 1.3 g/dL 1.1  Gamma Globulin 0.4 - 1.8 g/dL 1.6  M-SPIKE, % Not Observed g/dL Not Observed  (C): Corrected  Lab Results  Component Value Date   LDH 348 (H) 12/04/2023      Component Ref Range & Units (hover) 12 d ago  Haptoglobin 176     ASSESSMENT & PLAN:  Assessment/Plan:  A 83 y.o. female whose labs appear to show evidence of iron deficiency anemia.  Based upon this, I will arrange for her to receive IV iron over the next few weeks to rapidly replenish her iron stores and improve her hemoglobin.  I will see her back in 3 months to reassess her anemia to see how well she responded to her IV iron.  The patient understands all the plans discussed today and is in agreement with them.    Llewelyn Sheaffer Felicia Horde, MD

## 2023-12-17 ENCOUNTER — Telehealth: Payer: Self-pay

## 2023-12-17 ENCOUNTER — Encounter: Payer: Self-pay | Admitting: Oncology

## 2023-12-17 ENCOUNTER — Telehealth: Payer: Self-pay | Admitting: Oncology

## 2023-12-17 NOTE — Addendum Note (Signed)
 Addended by: Ezell Hollow on: 12/17/2023 12:32 PM   Modules accepted: Orders

## 2023-12-17 NOTE — Telephone Encounter (Signed)
 Latest Reference Range & Units 12/04/23 14:09 12/14/23 14:17  Iron 28 - 170 ug/dL   30  UIBC ug/dL   595  TIBC 638 - 756 ug/dL   433  Saturation Ratios 10.4 - 31.8 %   9 (L)  Ferritin 11 - 307 ng/mL   41  Transferrin Receptor 12.2 - 27.3 nmol/L 47.8 (H)     ASSESSMENT & PLAN:  Assessment/Plan:  A 83 y.o. female whose labs appear to show evidence of iron deficiency anemia.  Based upon this, I will arrange for her to receive IV iron over the next few weeks to rapidly replenish her iron stores and improve her hemoglobin.  I will see her back in 3 months to reassess her anemia to see how well she responded to her IV iron.  The patient understands all the plans discussed today and is in agreement with them.     Deloria Fetch, MD                Electronically signed by Deloria Fetch, MD at 12/16/2023  9:14 AM

## 2023-12-17 NOTE — Telephone Encounter (Signed)
 Patient has been scheduled. Aware of appt date and time.    Scheduling Message Entered by [place], AMY W on 12/17/2023 at  9:59 AM Priority: High INFUSION 1HR30MIN (90)  Department: [facility]  Provider: Deloria Fetch, MD  Appointment Notes:  Needs IV iron

## 2023-12-21 ENCOUNTER — Inpatient Hospital Stay

## 2023-12-21 VITALS — BP 109/70 | HR 110 | Temp 98.0°F | Resp 18

## 2023-12-21 DIAGNOSIS — D508 Other iron deficiency anemias: Secondary | ICD-10-CM

## 2023-12-21 DIAGNOSIS — D509 Iron deficiency anemia, unspecified: Secondary | ICD-10-CM | POA: Diagnosis not present

## 2023-12-21 MED ORDER — SODIUM CHLORIDE 0.9 % IV SOLN
510.0000 mg | Freq: Once | INTRAVENOUS | Status: AC
  Administered 2023-12-21: 510 mg via INTRAVENOUS
  Filled 2023-12-21: qty 510

## 2023-12-21 MED ORDER — SODIUM CHLORIDE 0.9 % IV SOLN: INTRAVENOUS | Status: DC

## 2023-12-21 NOTE — Patient Instructions (Signed)

## 2023-12-28 ENCOUNTER — Inpatient Hospital Stay

## 2023-12-28 VITALS — BP 105/67 | HR 110 | Temp 98.3°F | Resp 18

## 2023-12-28 DIAGNOSIS — D508 Other iron deficiency anemias: Secondary | ICD-10-CM

## 2023-12-28 DIAGNOSIS — D509 Iron deficiency anemia, unspecified: Secondary | ICD-10-CM | POA: Diagnosis not present

## 2023-12-28 MED ORDER — SODIUM CHLORIDE 0.9 % IV SOLN: INTRAVENOUS | Status: DC

## 2023-12-28 MED ORDER — SODIUM CHLORIDE 0.9 % IV SOLN
510.0000 mg | Freq: Once | INTRAVENOUS | Status: AC
  Administered 2023-12-28: 510 mg via INTRAVENOUS
  Filled 2023-12-28: qty 510

## 2023-12-28 NOTE — Patient Instructions (Signed)

## 2024-01-14 ENCOUNTER — Telehealth: Payer: Self-pay | Admitting: Rheumatology

## 2024-01-14 NOTE — Telephone Encounter (Signed)
 Pts daughter called stating pt has been diagnosed with cancer and will no longer be coming in the office till further notice. Pt wanted Dr. Alvira Josephs to know.

## 2024-01-17 DIAGNOSIS — D508 Other iron deficiency anemias: Secondary | ICD-10-CM | POA: Diagnosis not present

## 2024-02-06 DIAGNOSIS — D508 Other iron deficiency anemias: Secondary | ICD-10-CM

## 2024-02-12 DEATH — deceased

## 2024-03-18 ENCOUNTER — Ambulatory Visit: Admitting: Oncology

## 2024-03-18 ENCOUNTER — Other Ambulatory Visit

## 2024-04-01 ENCOUNTER — Ambulatory Visit: Admitting: Physician Assistant
